# Patient Record
Sex: Female | Born: 1980 | Race: White | Hispanic: No | Marital: Married | Smoking: Former smoker
Health system: Southern US, Community
[De-identification: ages and names within clinical notes are randomized; demographics above are authoritative.]

## PROBLEM LIST (undated history)

## (undated) DIAGNOSIS — F419 Anxiety disorder, unspecified: Secondary | ICD-10-CM

## (undated) DIAGNOSIS — N200 Calculus of kidney: Secondary | ICD-10-CM

## (undated) DIAGNOSIS — I10 Essential (primary) hypertension: Secondary | ICD-10-CM

## (undated) DIAGNOSIS — F32A Depression, unspecified: Secondary | ICD-10-CM

## (undated) DIAGNOSIS — K219 Gastro-esophageal reflux disease without esophagitis: Secondary | ICD-10-CM

## (undated) DIAGNOSIS — I219 Acute myocardial infarction, unspecified: Secondary | ICD-10-CM

## (undated) DIAGNOSIS — E669 Obesity, unspecified: Secondary | ICD-10-CM

## (undated) DIAGNOSIS — G43909 Migraine, unspecified, not intractable, without status migrainosus: Secondary | ICD-10-CM

## (undated) DIAGNOSIS — F329 Major depressive disorder, single episode, unspecified: Secondary | ICD-10-CM

## (undated) DIAGNOSIS — M1990 Unspecified osteoarthritis, unspecified site: Secondary | ICD-10-CM

## (undated) DIAGNOSIS — M069 Rheumatoid arthritis, unspecified: Secondary | ICD-10-CM

## (undated) DIAGNOSIS — L4050 Arthropathic psoriasis, unspecified: Secondary | ICD-10-CM

## (undated) HISTORY — PX: ABDOMINAL HYSTERECTOMY: SHX81

## (undated) HISTORY — DX: Arthropathic psoriasis, unspecified: L40.50

## (undated) HISTORY — DX: Depression, unspecified: F32.A

## (undated) HISTORY — DX: Migraine, unspecified, not intractable, without status migrainosus: G43.909

## (undated) HISTORY — DX: Unspecified osteoarthritis, unspecified site: M19.90

## (undated) HISTORY — DX: Anxiety disorder, unspecified: F41.9

## (undated) HISTORY — PX: LITHOTRIPSY: SUR834

## (undated) HISTORY — DX: Essential (primary) hypertension: I10

## (undated) HISTORY — DX: Obesity, unspecified: E66.9

## (undated) HISTORY — DX: Gastro-esophageal reflux disease without esophagitis: K21.9

---
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
## 2006-01-31 ENCOUNTER — Emergency Department: Payer: Self-pay | Admitting: Unknown Physician Specialty

## 2006-09-29 ENCOUNTER — Emergency Department: Payer: Self-pay | Admitting: Emergency Medicine

## 2006-11-18 ENCOUNTER — Emergency Department: Payer: Self-pay | Admitting: Emergency Medicine

## 2008-12-12 ENCOUNTER — Emergency Department: Payer: Self-pay | Admitting: Emergency Medicine

## 2009-09-14 ENCOUNTER — Emergency Department: Payer: Self-pay | Admitting: Emergency Medicine

## 2010-02-09 ENCOUNTER — Emergency Department: Payer: Self-pay | Admitting: Emergency Medicine

## 2010-03-03 ENCOUNTER — Emergency Department: Payer: Self-pay | Admitting: Emergency Medicine

## 2010-12-09 ENCOUNTER — Emergency Department: Payer: Self-pay | Admitting: Emergency Medicine

## 2011-03-22 ENCOUNTER — Emergency Department: Payer: Self-pay | Admitting: Emergency Medicine

## 2011-07-10 ENCOUNTER — Emergency Department: Payer: Self-pay | Admitting: Emergency Medicine

## 2011-09-08 ENCOUNTER — Ambulatory Visit: Payer: Self-pay | Admitting: Internal Medicine

## 2011-09-08 LAB — URINALYSIS, COMPLETE
Blood: NEGATIVE
Glucose,UR: NEGATIVE mg/dL (ref 0–75)
Leukocyte Esterase: NEGATIVE
Nitrite: NEGATIVE
Specific Gravity: 1.02 (ref 1.003–1.030)

## 2012-02-15 ENCOUNTER — Emergency Department: Payer: Self-pay | Admitting: Emergency Medicine

## 2012-02-15 LAB — CBC
HCT: 41.6 %
HGB: 13.9 g/dL
MCH: 32 pg
MCHC: 33.3 g/dL
MCV: 96 fL
Platelet: 309 x10 3/mm 3
RBC: 4.33 X10 6/mm 3
RDW: 13.9 %
WBC: 11.1 x10 3/mm 3 — ABNORMAL HIGH

## 2012-02-15 LAB — COMPREHENSIVE METABOLIC PANEL WITH GFR
Albumin: 3.3 g/dL — ABNORMAL LOW
Alkaline Phosphatase: 100 U/L
Anion Gap: 8
BUN: 8 mg/dL
Bilirubin,Total: 0.3 mg/dL
Calcium, Total: 8.9 mg/dL
Chloride: 107 mmol/L
Co2: 26 mmol/L
Creatinine: 0.67 mg/dL
EGFR (African American): >60
EGFR (Non-African Amer.): >60
Glucose: 75 mg/dL
Osmolality: 278
Potassium: 3.8 mmol/L
SGOT(AST): 30 U/L
SGPT (ALT): 27 U/L
Sodium: 141 mmol/L
Total Protein: 7.5 g/dL

## 2012-02-15 LAB — LIPASE, BLOOD: Lipase: 138 U/L (ref 73–393)

## 2012-02-15 LAB — URINALYSIS, COMPLETE
Blood: NEGATIVE
Glucose,UR: NEGATIVE mg/dL (ref 0–75)
Leukocyte Esterase: NEGATIVE
Nitrite: NEGATIVE
Ph: 8 (ref 4.5–8.0)
RBC,UR: <1 /HPF (ref 0–5)
Specific Gravity: 1.013 (ref 1.003–1.030)
Squamous Epithelial: <1

## 2012-12-27 ENCOUNTER — Emergency Department: Payer: Self-pay | Admitting: Emergency Medicine

## 2013-01-26 ENCOUNTER — Ambulatory Visit (INDEPENDENT_AMBULATORY_CARE_PROVIDER_SITE_OTHER): Payer: 59 | Admitting: Internal Medicine

## 2013-01-26 ENCOUNTER — Encounter: Payer: Self-pay | Admitting: Internal Medicine

## 2013-01-26 VITALS — BP 144/94 | HR 102 | Temp 98.3°F | Ht 62.75 in | Wt 208.0 lb

## 2013-01-26 DIAGNOSIS — E669 Obesity, unspecified: Secondary | ICD-10-CM

## 2013-01-26 DIAGNOSIS — L4050 Arthropathic psoriasis, unspecified: Secondary | ICD-10-CM

## 2013-01-26 DIAGNOSIS — I10 Essential (primary) hypertension: Secondary | ICD-10-CM

## 2013-01-26 LAB — CBC WITH DIFFERENTIAL/PLATELET
Basophils Relative: 0.4 % (ref 0.0–3.0)
HCT: 44.3 % (ref 36.0–46.0)
Hemoglobin: 15.1 g/dL — ABNORMAL HIGH (ref 12.0–15.0)
Lymphocytes Relative: 35 % (ref 12.0–46.0)
Lymphs Abs: 4.6 10*3/uL — ABNORMAL HIGH (ref 0.7–4.0)
Monocytes Relative: 9.2 % (ref 3.0–12.0)
Neutro Abs: 6.6 10*3/uL (ref 1.4–7.7)
RBC: 4.64 Mil/uL (ref 3.87–5.11)

## 2013-01-26 LAB — COMPREHENSIVE METABOLIC PANEL
ALT: 28 U/L (ref 0–35)
AST: 23 U/L (ref 0–37)
CO2: 28 mEq/L (ref 19–32)
Calcium: 9.3 mg/dL (ref 8.4–10.5)
Chloride: 101 mEq/L (ref 96–112)
Creatinine, Ser: 0.6 mg/dL (ref 0.4–1.2)
GFR: 114.7 mL/min (ref >60.00)
Sodium: 136 mEq/L (ref 135–145)
Total Bilirubin: 0.4 mg/dL (ref 0.3–1.2)
Total Protein: 7.9 g/dL (ref 6.0–8.3)

## 2013-01-26 LAB — MICROALBUMIN / CREATININE URINE RATIO: Microalb Creat Ratio: 0.3 mg/g (ref 0.0–30.0)

## 2013-01-26 LAB — TSH: TSH: 1.95 u[IU]/mL (ref 0.35–5.50)

## 2013-01-26 MED ORDER — LISINOPRIL 10 MG PO TABS: 10.0000 mg | ORAL_TABLET | Freq: Every day | ORAL | Status: DC

## 2013-01-26 NOTE — Assessment & Plan Note (Signed)
H/o psoriatic arthritis on Enbrel. Poor control of joint pain and swelling in the hands. Decreased grip strength. Will set up rheumatology evaluation. Question if a second medication such as methotrexate might be helpful. Will request previous notes from [facility].

## 2013-01-26 NOTE — Assessment & Plan Note (Signed)
BP Readings from Last 3 Encounters:  01/26/13 144/94   BP elevated in clinic and at home. Will start Lisinopril 10mg  daily. Pt has tolerated this well in the past. Then, recheck Cr and K in 1 week. Follow up 4 weeks and prn.

## 2013-01-26 NOTE — Patient Instructions (Signed)
[employer] app for phone 

## 2013-01-26 NOTE — Assessment & Plan Note (Signed)
Body mass index is 37.13 kg/(m^2).  Recommended keeping a food and calorie diet with app such as [employer]itnessPal. Recommended increased physical activity with goal of per week. We discussed appetite suppressants, however will hold off for now because of hypertension.

## 2013-01-26 NOTE — Progress Notes (Signed)
Subjective:    Patient ID: Chelsea Nolan, female    DOB: 02-13-1981, 32 y.o.   MRN: [id number]  HPI 32 year old female with history of psoriatic arthritis, hypertension, obesity presents to establish care. She reports that she is generally feeling well. She does continue to have aching pain in the joints of her arms and hands. She also has aching pain in her back. She reports that symptoms of psoriasis improves significantly with the use of Enbrel. Arthralgia improved somewhat with use of this medication. She is not currently followed by rheumatologist but would like to reestablish care. She does follow regularly with her dermatologist.  In regards to hypertension, she reports she was previously on lisinopril hydrochlorothiazide. She has not been on medication in years. Recent blood pressures have been greater than 140/90. She denies any chest pain, palpitations, headache.  In regards to her obesity, she reports she has struggled with her weight throughout her life. She has been working on trying to improve her diet. She thinks that stress plays a significant role and overeating. She is interested in using appetite suppressants. She notes that her sister had gastric bypass and did very poorly after surgery.  Outpatient Encounter Prescriptions as of 01/26/2013  Medication Sig Dispense Refill  . etanercept (ENBREL) 50 MG/ML injection Inject 50 mg into the skin once a week.       No facility-administered encounter medications on file as of 01/26/2013.   BP 144/94  Pulse 102  Temp(Src) 98.3 F (36.8 C) (Oral)  Ht 5' 2.75" (1.594 m)  Wt 208 lb (94.348 kg)  BMI 37.13 kg/m2  SpO2 96%  LMP 01/24/2013  Review of Systems  Constitutional: Negative for fever, chills, appetite change, fatigue and unexpected weight change.  HENT: Negative for ear pain, congestion, sore throat, trouble swallowing, neck pain, voice change and sinus pressure.   Eyes: Negative for visual disturbance.  Respiratory: Negative  for cough, shortness of breath, wheezing and stridor.   Cardiovascular: Negative for chest pain, palpitations and leg swelling.  Gastrointestinal: Negative for nausea, vomiting, abdominal pain, diarrhea, constipation, blood in stool, abdominal distention and anal bleeding.  Genitourinary: Negative for dysuria and flank pain.  Musculoskeletal: Positive for arthralgias. Negative for myalgias and gait problem.  Skin: Negative for color change and rash.  Neurological: Negative for dizziness and headaches.  Hematological: Negative for adenopathy. Does not bruise/bleed easily.  Psychiatric/Behavioral: Negative for suicidal ideas, sleep disturbance and dysphoric mood. The patient is not nervous/anxious.        Objective:   Physical Exam  Constitutional: She is oriented to person, place, and time. She appears well-developed and well-nourished. No distress.  HENT:  Head: Normocephalic and atraumatic.  Right Ear: External ear normal.  Left Ear: External ear normal.  Nose: Nose normal.  Mouth/Throat: Oropharynx is clear and moist. No oropharyngeal exudate.  Eyes: Conjunctivae are normal. Pupils are equal, round, and reactive to light. Right eye exhibits no discharge. Left eye exhibits no discharge. No scleral icterus.  Neck: Normal range of motion. Neck supple. No tracheal deviation present. No thyromegaly present.  Cardiovascular: Normal rate, regular rhythm, normal heart sounds and intact distal pulses.  Exam reveals no gallop and no friction rub.   No murmur heard. Pulmonary/Chest: Effort normal and breath sounds normal. No accessory muscle usage. Not tachypneic. No respiratory distress. She has no decreased breath sounds. She has no wheezes. She has no rhonchi. She has no rales. She exhibits no tenderness.  Abdominal: Soft. Bowel sounds are normal. She exhibits no  distension and no mass. There is no tenderness. There is no rebound and no guarding.  Musculoskeletal: Normal range of motion. She  exhibits no edema and no tenderness.  Lymphadenopathy:    She has no cervical adenopathy.  Neurological: She is alert and oriented to person, place, and time. No cranial nerve deficit. She exhibits normal muscle tone. Coordination normal.  Skin: Skin is warm and dry. No rash noted. She is not diaphoretic. No erythema. No pallor.  Psychiatric: She has a normal mood and affect. Her behavior is normal. Judgment and thought content normal.          Assessment & Plan:

## 2013-01-27 ENCOUNTER — Telehealth: Payer: Self-pay | Admitting: *Deleted

## 2013-01-27 NOTE — Telephone Encounter (Signed)
Message copied by Theola Sequin on Fri Jan 27, 2013 11:22 AM ------      Message from: Ronna Polio A      Created: Thu Jan 26, 2013  7:16 PM       Labs show normal kidney and liver function. Normal thyroid function. WBC was slightly high. Other blood counts normal. I am still waiting on some additional labs. ------

## 2013-01-27 NOTE — Telephone Encounter (Signed)
Patient informed about her lab results, but was curious if the elevated WBC was due to taking the Enbrel injections?

## 2013-01-27 NOTE — Telephone Encounter (Signed)
That is possible

## 2013-01-30 NOTE — Telephone Encounter (Signed)
Patient informed and verbally agreed.  

## 2013-02-01 ENCOUNTER — Other Ambulatory Visit (INDEPENDENT_AMBULATORY_CARE_PROVIDER_SITE_OTHER): Payer: 59

## 2013-02-01 DIAGNOSIS — I10 Essential (primary) hypertension: Secondary | ICD-10-CM

## 2013-02-01 LAB — BASIC METABOLIC PANEL
BUN: 11 mg/dL (ref 6–23)
CO2: 25 mEq/L (ref 19–32)
Chloride: 104 mEq/L (ref 96–112)
Creatinine, Ser: 0.7 mg/dL (ref 0.4–1.2)
Glucose, Bld: 137 mg/dL — ABNORMAL HIGH (ref 70–99)
Potassium: 3.9 mEq/L (ref 3.5–5.1)

## 2013-02-27 ENCOUNTER — Ambulatory Visit (INDEPENDENT_AMBULATORY_CARE_PROVIDER_SITE_OTHER)
Admission: RE | Admit: 2013-02-27 | Discharge: 2013-02-27 | Disposition: A | Discharge status: Home / Self Care | Payer: 59 | Source: Ambulatory Visit | Attending: Internal Medicine | Admitting: Internal Medicine

## 2013-02-27 ENCOUNTER — Encounter: Payer: Self-pay | Admitting: Internal Medicine

## 2013-02-27 ENCOUNTER — Ambulatory Visit (INDEPENDENT_AMBULATORY_CARE_PROVIDER_SITE_OTHER): Payer: 59 | Admitting: Internal Medicine

## 2013-02-27 VITALS — BP 134/108 | HR 102 | Temp 98.7°F | Wt 207.0 lb

## 2013-02-27 DIAGNOSIS — J189 Pneumonia, unspecified organism: Secondary | ICD-10-CM | POA: Insufficient documentation

## 2013-02-27 DIAGNOSIS — I10 Essential (primary) hypertension: Secondary | ICD-10-CM

## 2013-02-27 MED ORDER — HYDROCOD POLST-CHLORPHEN POLST 10-8 MG/5ML PO LQCR: 5.0000 mL | Freq: Two times a day (BID) | ORAL | Status: DC | PRN

## 2013-02-27 MED ORDER — LEVOFLOXACIN 750 MG PO TABS: 750.0000 mg | ORAL_TABLET | Freq: Every day | ORAL | Status: DC

## 2013-02-27 NOTE — Assessment & Plan Note (Signed)
BP poorly controlled today, however pt reports has been well controlled at home. Will continue lisinopril. Will recheck BP at visit in 2 days.

## 2013-02-27 NOTE — Progress Notes (Signed)
Subjective:    Patient ID: Chelsea Nolan, female    DOB: 01/10/1981, 32 y.o.   MRN: [id number]  HPI 32YO female with h/o psoriatic arthritis on Embrel presents for acute visit c/o cough productive of purulent sputum x 3 days. Symptoms began on Saturday. No fever, chills noted. Complains of right sided chest wall pain, made worse by cough. No wheezing. Occasional dyspnea during coughing spells. Using OTC cough medication with no improvement. No known sick contacts.  Outpatient Encounter Prescriptions as of 02/27/2013  Medication Sig Dispense Refill  . etanercept (ENBREL) 50 MG/ML injection Inject 50 mg into the skin once a week.      Marland Kitchen lisinopril (PRINIVIL,ZESTRIL) 10 MG tablet Take 1 tablet (10 mg total) by mouth daily.  90 tablet  3  . Phenyleph-Doxylamine-DM-APAP (TYLENOL COLD MULTI-SYMPTOM) 5-6.25-10-325 MG/15ML LIQD Take by mouth.      . pseudoephedrine-guaifenesin (MUCINEX D) 60-600 MG per tablet Take 1 tablet by mouth every 12 (twelve) hours.       No facility-administered encounter medications on file as of 02/27/2013.   BP 134/108  Pulse 102  Temp(Src) 98.7 F (37.1 C) (Oral)  Wt 207 lb (93.895 kg)  BMI 36.95 kg/m2  SpO2 97%  LMP 02/21/2013  Review of Systems  Constitutional: Positive for fatigue. Negative for fever, chills and unexpected weight change.  HENT: Negative for hearing loss, ear pain, nosebleeds, congestion, sore throat, facial swelling, rhinorrhea, sneezing, mouth sores, trouble swallowing, neck pain, neck stiffness, voice change, postnasal drip, sinus pressure, tinnitus and ear discharge.   Eyes: Negative for pain, discharge, redness and visual disturbance.  Respiratory: Positive for cough and shortness of breath. Negative for chest tightness, wheezing and stridor.   Cardiovascular: Positive for chest pain. Negative for palpitations and leg swelling.  Musculoskeletal: Negative for myalgias and arthralgias.  Skin: Negative for color change and rash.  Neurological:  Negative for dizziness, weakness, light-headedness and headaches.  Hematological: Negative for adenopathy.       Objective:   Physical Exam  Constitutional: She is oriented to person, place, and time. She appears well-developed and well-nourished. No distress.  HENT:  Head: Normocephalic and atraumatic.  Right Ear: External ear normal.  Left Ear: External ear normal.  Nose: Nose normal.  Mouth/Throat: Oropharynx is clear and moist. No oropharyngeal exudate.  Eyes: Conjunctivae are normal. Pupils are equal, round, and reactive to light. Right eye exhibits no discharge. Left eye exhibits no discharge. No scleral icterus.  Neck: Normal range of motion. Neck supple. No tracheal deviation present. No thyromegaly present.  Cardiovascular: Normal rate, regular rhythm, normal heart sounds and intact distal pulses.  Exam reveals no gallop and no friction rub.   No murmur heard. Pulmonary/Chest: Effort normal. No accessory muscle usage. Not tachypneic. No respiratory distress. She has decreased breath sounds in the right lower field. She has no wheezes. She has rhonchi in the right lower field. She has no rales. She exhibits no tenderness.  Musculoskeletal: Normal range of motion. She exhibits no edema and no tenderness.  Lymphadenopathy:    She has no cervical adenopathy.  Neurological: She is alert and oriented to person, place, and time. No cranial nerve deficit. She exhibits normal muscle tone. Coordination normal.  Skin: Skin is warm and dry. No rash noted. She is not diaphoretic. No erythema. No pallor.  Psychiatric: She has a normal mood and affect. Her behavior is normal. Judgment and thought content normal.          Assessment & Plan:

## 2013-02-27 NOTE — Assessment & Plan Note (Signed)
Exam is most consistent with CAP RLL, however CXR normal. Will treat with levaquin and use Tussionex for cough. Pt will follow up for recheck in 2 days or sooner if symptoms are worsening. Encouraged rest, increased fluids.

## 2013-03-02 ENCOUNTER — Ambulatory Visit (INDEPENDENT_AMBULATORY_CARE_PROVIDER_SITE_OTHER): Payer: 59 | Admitting: Internal Medicine

## 2013-03-02 ENCOUNTER — Encounter: Payer: Self-pay | Admitting: Internal Medicine

## 2013-03-02 VITALS — BP 142/100 | HR 105 | Temp 99.0°F | Wt 208.0 lb

## 2013-03-02 DIAGNOSIS — J189 Pneumonia, unspecified organism: Secondary | ICD-10-CM

## 2013-03-02 LAB — COMPREHENSIVE METABOLIC PANEL
AST: 26 U/L (ref 0–37)
Alkaline Phosphatase: 84 U/L (ref 39–117)
BUN: 7 mg/dL (ref 6–23)
Calcium: 9.4 mg/dL (ref 8.4–10.5)
Creatinine, Ser: 0.7 mg/dL (ref 0.4–1.2)
Total Bilirubin: 0.6 mg/dL (ref 0.3–1.2)

## 2013-03-02 LAB — CBC WITH DIFFERENTIAL/PLATELET
Basophils Relative: 0.5 % (ref 0.0–3.0)
Eosinophils Absolute: 0.7 10*3/uL (ref 0.0–0.7)
Hemoglobin: 14.5 g/dL (ref 12.0–15.0)
Lymphocytes Relative: 41.7 % (ref 12.0–46.0)
MCHC: 34 g/dL (ref 30.0–36.0)
MCV: 94.3 fl (ref 78.0–100.0)
Neutro Abs: 4.3 10*3/uL (ref 1.4–7.7)
RBC: 4.51 Mil/uL (ref 3.87–5.11)

## 2013-03-02 MED ORDER — BENZONATATE 200 MG PO CAPS: 200.0000 mg | ORAL_CAPSULE | Freq: Two times a day (BID) | ORAL | Status: DC | PRN

## 2013-03-02 NOTE — Assessment & Plan Note (Signed)
Symptoms have improved with decreased cough after starting treatment with Levaquin. Exam is much improved with better air movement today. However, still having some cough during daytime. Will add tessalon during daytime and continue Tussionex at night. Will check CBC today. Encouraged rest, increased fluid intake. Will recheck next week or sooner if symptoms are not continuing to improve.

## 2013-03-02 NOTE — Patient Instructions (Signed)
[email or web address]  

## 2013-03-02 NOTE — Progress Notes (Signed)
Subjective:    Patient ID: Chelsea Nolan, female    DOB: 08-Nov-1980, 32 y.o.   MRN: [id number]  HPI 32YO female presents for follow up pneumonia. Symptoms of cough have improved, however still having productive cough with purulent sputum esp at night. Taking Tussionex with minimal improvement. No fever at home, however has felt subjectively "hot." Continues to have mild chest wall pain with cough. Shortness of breath has improved. No side effects noted on Levaquin. Pt has stopped her Embrel.  Outpatient Encounter Prescriptions as of 03/02/2013  Medication Sig Dispense Refill  . chlorpheniramine-HYDROcodone (TUSSIONEX) 10-8 MG/5ML LQCR Take 5 mLs by mouth every 12 (twelve) hours as needed.  140 mL  0  . etanercept (ENBREL) 50 MG/ML injection Inject 50 mg into the skin once a week.      Marland Kitchen levofloxacin (LEVAQUIN) 750 MG tablet Take 1 tablet (750 mg total) by mouth daily.  7 tablet  0  . lisinopril (PRINIVIL,ZESTRIL) 10 MG tablet Take 1 tablet (10 mg total) by mouth daily.  90 tablet  3  . Phenyleph-Doxylamine-DM-APAP (TYLENOL COLD MULTI-SYMPTOM) 5-6.25-10-325 MG/15ML LIQD Take by mouth.       No facility-administered encounter medications on file as of 03/02/2013.   BP 142/100  Pulse 105  Temp(Src) 99 F (37.2 C) (Oral)  Wt 208 lb (94.348 kg)  BMI 37.13 kg/m2  SpO2 97%  LMP 02/21/2013  Review of Systems  Constitutional: Positive for fatigue. Negative for fever, chills and unexpected weight change.  HENT: Negative for hearing loss, ear pain, nosebleeds, congestion, sore throat, facial swelling, rhinorrhea, sneezing, mouth sores, trouble swallowing, neck pain, neck stiffness, voice change, postnasal drip, sinus pressure, tinnitus and ear discharge.   Eyes: Negative for pain, discharge, redness and visual disturbance.  Respiratory: Positive for cough. Negative for chest tightness, shortness of breath, wheezing and stridor.   Cardiovascular: Positive for chest pain (chest wall pain with cough).  Negative for palpitations and leg swelling.  Musculoskeletal: Negative for myalgias and arthralgias.  Skin: Negative for color change and rash.  Neurological: Negative for dizziness, weakness, light-headedness and headaches.  Hematological: Negative for adenopathy.       Objective:   Physical Exam  Constitutional: She is oriented to person, place, and time. She appears well-developed and well-nourished. No distress.  HENT:  Head: Normocephalic and atraumatic.  Right Ear: External ear normal.  Left Ear: External ear normal.  Nose: Nose normal.  Mouth/Throat: Oropharynx is clear and moist. No oropharyngeal exudate.  Eyes: Conjunctivae are normal. Pupils are equal, round, and reactive to light. Right eye exhibits no discharge. Left eye exhibits no discharge. No scleral icterus.  Neck: Normal range of motion. Neck supple. No tracheal deviation present. No thyromegaly present.  Cardiovascular: Normal rate, regular rhythm, normal heart sounds and intact distal pulses.  Exam reveals no gallop and no friction rub.   No murmur heard. Pulmonary/Chest: Effort normal. No accessory muscle usage. Not tachypneic. No respiratory distress. She has no decreased breath sounds. She has no wheezes. She has rhonchi (scattered). She has no rales. She exhibits no tenderness.  Musculoskeletal: Normal range of motion. She exhibits no edema and no tenderness.  Lymphadenopathy:    She has no cervical adenopathy.  Neurological: She is alert and oriented to person, place, and time. No cranial nerve deficit. She exhibits normal muscle tone. Coordination normal.  Skin: Skin is warm and dry. No rash noted. She is not diaphoretic. No erythema. No pallor.  Psychiatric: She has a normal mood and affect. Her  behavior is normal. Judgment and thought content normal.          Assessment & Plan:

## 2013-03-05 ENCOUNTER — Emergency Department: Payer: Self-pay | Admitting: Emergency Medicine

## 2013-03-05 LAB — CBC
HCT: 44.6 % (ref 35.0–47.0)
MCH: 32.3 pg (ref 26.0–34.0)
MCHC: 35 g/dL (ref 32.0–36.0)
MCV: 92 fL (ref 80–100)
WBC: 12.7 10*3/uL — ABNORMAL HIGH (ref 3.6–11.0)

## 2013-03-05 LAB — BASIC METABOLIC PANEL
Anion Gap: 7 (ref 7–16)
BUN: 8 mg/dL (ref 7–18)
Chloride: 104 mmol/L (ref 98–107)
Creatinine: 0.76 mg/dL (ref 0.60–1.30)
EGFR (African American): >60
EGFR (Non-African Amer.): >60
Glucose: 96 mg/dL (ref 65–99)
Osmolality: 270 (ref 275–301)
Sodium: 136 mmol/L (ref 136–145)

## 2013-03-06 ENCOUNTER — Other Ambulatory Visit: Payer: Self-pay | Admitting: Internal Medicine

## 2013-03-06 DIAGNOSIS — J189 Pneumonia, unspecified organism: Secondary | ICD-10-CM

## 2013-03-06 MED ORDER — HYDROCOD POLST-CHLORPHEN POLST 10-8 MG/5ML PO LQCR: 5.0000 mL | Freq: Two times a day (BID) | ORAL | Status: DC | PRN

## 2013-03-08 ENCOUNTER — Encounter: Payer: Self-pay | Admitting: Internal Medicine

## 2013-03-08 ENCOUNTER — Ambulatory Visit (INDEPENDENT_AMBULATORY_CARE_PROVIDER_SITE_OTHER): Payer: 59 | Admitting: Internal Medicine

## 2013-03-08 VITALS — BP 140/98 | HR 112 | Temp 98.8°F | Wt 204.0 lb

## 2013-03-08 DIAGNOSIS — Z720 Tobacco use: Secondary | ICD-10-CM

## 2013-03-08 DIAGNOSIS — Z23 Encounter for immunization: Secondary | ICD-10-CM

## 2013-03-08 DIAGNOSIS — L4050 Arthropathic psoriasis, unspecified: Secondary | ICD-10-CM

## 2013-03-08 DIAGNOSIS — F17200 Nicotine dependence, unspecified, uncomplicated: Secondary | ICD-10-CM

## 2013-03-08 DIAGNOSIS — E669 Obesity, unspecified: Secondary | ICD-10-CM

## 2013-03-08 DIAGNOSIS — I10 Essential (primary) hypertension: Secondary | ICD-10-CM

## 2013-03-08 DIAGNOSIS — J189 Pneumonia, unspecified organism: Secondary | ICD-10-CM

## 2013-03-08 MED ORDER — LISINOPRIL 20 MG PO TABS: 20.0000 mg | ORAL_TABLET | Freq: Every day | ORAL | Status: DC

## 2013-03-08 NOTE — Progress Notes (Signed)
Subjective:    Patient ID: Chelsea Nolan, female    DOB: 08-Mar-1981, 32 y.o.   MRN: [id number]  HPI 32 year old female with history of psoriatic arthritis, hypertension, and recent episode of pneumonia presents for followup. She reports that symptoms of cough, fever, chest pain have resolved. She completed a course of Levaquin. She notes that since she has been off her Enbrel her symptoms of abdominal pain and nausea have completely resolved. She would like to try different medication to better manage her symptoms of psoriasis. She is scheduled to see a rheumatologist later this month.   She is also concerned about her weight. She would like to try medication to help suppress her appetite. She is not following any particular diet or exercise program.  She has a history of hypertension and is compliant with use of lisinopril. She denies any recent chest pain, headache, palpitations.  Outpatient Encounter Prescriptions as of 03/08/2013  Medication Sig Dispense Refill  . lisinopril (PRINIVIL,ZESTRIL) 20 MG tablet Take 1 tablet (20 mg total) by mouth daily.  90 tablet  3  . loratadine (CLARITIN) 10 MG tablet Take 10 mg by mouth daily.      . [DISCONTINUED] etanercept (ENBREL) 50 MG/ML injection Inject 50 mg into the skin once a week.      . [DISCONTINUED] levofloxacin (LEVAQUIN) 750 MG tablet Take 1 tablet (750 mg total) by mouth daily.  7 tablet  0  . [DISCONTINUED] lisinopril (PRINIVIL,ZESTRIL) 10 MG tablet Take 1 tablet (10 mg total) by mouth daily.  90 tablet  3  . benzonatate (TESSALON) 200 MG capsule Take 1 capsule (200 mg total) by mouth 2 (two) times daily as needed for cough.  60 capsule  0  . chlorpheniramine-HYDROcodone (TUSSIONEX) 10-8 MG/5ML LQCR Take 5 mLs by mouth every 12 (twelve) hours as needed.  140 mL  0  . Phenyleph-Doxylamine-DM-APAP (TYLENOL COLD MULTI-SYMPTOM) 5-6.25-10-325 MG/15ML LIQD Take by mouth.       No facility-administered encounter medications on file as of 03/08/2013.    BP 140/98  Pulse 112  Temp(Src) 98.8 F (37.1 C) (Oral)  Wt 204 lb (92.534 kg)  BMI 36.42 kg/m2  SpO2 97%  LMP 02/21/2013  Review of Systems  Constitutional: Negative for fever, chills, appetite change, fatigue and unexpected weight change.  HENT: Negative for ear pain, congestion, sore throat, trouble swallowing, neck pain, voice change and sinus pressure.   Eyes: Negative for visual disturbance.  Respiratory: Negative for cough, shortness of breath, wheezing and stridor.   Cardiovascular: Negative for chest pain, palpitations and leg swelling.  Gastrointestinal: Negative for nausea, vomiting, abdominal pain, diarrhea, constipation, blood in stool, abdominal distention and anal bleeding.  Genitourinary: Negative for dysuria and flank pain.  Musculoskeletal: Negative for myalgias, arthralgias and gait problem.  Skin: Negative for color change and rash.  Neurological: Negative for dizziness and headaches.  Hematological: Negative for adenopathy. Does not bruise/bleed easily.  Psychiatric/Behavioral: Negative for suicidal ideas, sleep disturbance and dysphoric mood. The patient is not nervous/anxious.        Objective:   Physical Exam  Constitutional: She is oriented to person, place, and time. She appears well-developed and well-nourished. No distress.  HENT:  Head: Normocephalic and atraumatic.  Right Ear: External ear normal.  Left Ear: External ear normal.  Nose: Nose normal.  Mouth/Throat: Oropharynx is clear and moist. No oropharyngeal exudate.  Eyes: Conjunctivae are normal. Pupils are equal, round, and reactive to light. Right eye exhibits no discharge. Left eye exhibits no discharge. No  scleral icterus.  Neck: Normal range of motion. Neck supple. No tracheal deviation present. No thyromegaly present.  Cardiovascular: Normal rate, regular rhythm, normal heart sounds and intact distal pulses.  Exam reveals no gallop and no friction rub.   No murmur  heard. Pulmonary/Chest: Effort normal and breath sounds normal. No accessory muscle usage. Not tachypneic. No respiratory distress. She has no decreased breath sounds. She has no wheezes. She has no rhonchi. She has no rales. She exhibits no tenderness.  Musculoskeletal: Normal range of motion. She exhibits no edema and no tenderness.  Lymphadenopathy:    She has no cervical adenopathy.  Neurological: She is alert and oriented to person, place, and time. No cranial nerve deficit. She exhibits normal muscle tone. Coordination normal.  Skin: Skin is warm and dry. No rash noted. She is not diaphoretic. No erythema. No pallor.  Psychiatric: She has a normal mood and affect. Her behavior is normal. Judgment and thought content normal.          Assessment & Plan:

## 2013-03-08 NOTE — Assessment & Plan Note (Signed)
BP Readings from Last 3 Encounters:  03/08/13 140/98  03/02/13 142/100  02/27/13 134/108   Blood pressure continues to be elevated. Will increase lisinopril to 20 mg daily. Will recheck creatinine and potassium in one week. Followup in 4 weeks.

## 2013-03-08 NOTE — Patient Instructions (Signed)
Increase Lisinopril to 20mg  daily.  Check labs next week.  Follow up in 4 weeks.

## 2013-03-08 NOTE — Assessment & Plan Note (Signed)
She has stopped Enbrel because of nausea and abdominal pain on this medication. Encouraged her to discuss this with rheumatology next week. Question if she might be a candidate for alternative medication such as methotrexate.

## 2013-03-08 NOTE — Assessment & Plan Note (Signed)
Strongly encouraged smoking cessation. 

## 2013-03-08 NOTE — Assessment & Plan Note (Addendum)
Symptoms of pneumonia have resolved after treatment with Levaquin. Will get recent chest x-ray report completed at [facility] regional over the weekend. Patient will call if any recurrent symptoms.

## 2013-03-08 NOTE — Assessment & Plan Note (Signed)
Wt Readings from Last 3 Encounters:  03/08/13 204 lb (92.534 kg)  03/02/13 208 lb (94.348 kg)  02/27/13 207 lb (93.895 kg)   Body mass index is 36.42 kg/(m^2). Patient would like to try phentermine to help with appetite suppression. We discussed that she will need to get her blood pressure under better control prior to starting this medication. As noted, will increase lisinopril today. Encouraged effort at healthy diet, low in processed carbohydrates and saturated fat. Encouraged regular physical activity. Follow up in 4 weeks.

## 2013-03-09 ENCOUNTER — Encounter: Payer: Self-pay | Admitting: Internal Medicine

## 2013-03-22 ENCOUNTER — Other Ambulatory Visit (INDEPENDENT_AMBULATORY_CARE_PROVIDER_SITE_OTHER): Payer: 59

## 2013-03-22 DIAGNOSIS — I10 Essential (primary) hypertension: Secondary | ICD-10-CM

## 2013-03-22 LAB — BASIC METABOLIC PANEL
BUN: 7 mg/dL (ref 6–23)
Chloride: 102 mEq/L (ref 96–112)
GFR: 108.68 mL/min (ref >60.00)
Potassium: 4.2 mEq/L (ref 3.5–5.1)
Sodium: 134 mEq/L — ABNORMAL LOW (ref 135–145)

## 2013-03-25 ENCOUNTER — Emergency Department: Payer: Self-pay | Admitting: Emergency Medicine

## 2013-04-05 ENCOUNTER — Encounter: Payer: Self-pay | Admitting: Internal Medicine

## 2013-04-05 ENCOUNTER — Ambulatory Visit (INDEPENDENT_AMBULATORY_CARE_PROVIDER_SITE_OTHER): Payer: 59 | Admitting: Internal Medicine

## 2013-04-05 VITALS — BP 128/98 | HR 100 | Temp 98.7°F | Ht 62.75 in | Wt 203.0 lb

## 2013-04-05 DIAGNOSIS — E669 Obesity, unspecified: Secondary | ICD-10-CM

## 2013-04-05 DIAGNOSIS — L4050 Arthropathic psoriasis, unspecified: Secondary | ICD-10-CM

## 2013-04-05 DIAGNOSIS — I10 Essential (primary) hypertension: Secondary | ICD-10-CM

## 2013-04-05 DIAGNOSIS — Z23 Encounter for immunization: Secondary | ICD-10-CM

## 2013-04-05 MED ORDER — LISINOPRIL 40 MG PO TABS: 40.0000 mg | ORAL_TABLET | Freq: Every day | ORAL | Status: DC

## 2013-04-05 NOTE — Assessment & Plan Note (Signed)
BP Readings from Last 3 Encounters:  04/05/13 128/98  03/08/13 140/98  03/02/13 142/100   BP continues to be elevated. Will increase Lisinopril to 40mg  daily. Check Cr and K with labs in 1 week. Follow up in 4 weeks and prn.

## 2013-04-05 NOTE — Progress Notes (Signed)
Subjective:    Patient ID: Chelsea Nolan, female    DOB: [birth year], 32 y.o.   MRN: [id number]  HPI 32 year old female with history of psoriatic arthritis, hypertension, obesity, tobacco abuse presents for followup. She was recently seen at [facility] by a rheumatologist and was started on methotrexate. She had x-rays which confirmed of psoriatic arthritis. Decision was made to hold Enbrel. She reports that she is tolerating methotrexate well.  In regards to blood pressure, her dose of lisinopril was recently increased to 20 mg daily. She reports that most blood pressure has generally been around 130/90. She denies any headache, chest pain, palpitations. She denies any noted side effects from the lisinopril.  She continues to try to lose weight. She is trying to follow a healthier diet and increase her physical activity. She is interested in using phentermine to help with appetite suppression.  Outpatient Encounter Prescriptions as of 04/05/2013  Medication Sig Dispense Refill  . folic acid (FOLVITE) 1 MG tablet Take 1 mg by mouth. Take 1 tablet (1 mg total) by mouth daily.      Marland Kitchen loratadine (CLARITIN) 10 MG tablet Take 10 mg by mouth daily.      . methotrexate (RHEUMATREX) 2.5 MG tablet Start 4 pills one day per week x 2 weeks, then 6 pills one day per week x 2 weeks, then fill other rx.      . SUMAtriptan (IMITREX) 50 MG tablet Take 50 mg by mouth. Take 50 mg by mouth. Frequency:PHARMDIR   Dosage:50   MG  Instructions:  Note:Taking 1 when aura begins and may repeat in one hour if needed Dose: 50MG       . [DISCONTINUED] lisinopril (PRINIVIL,ZESTRIL) 20 MG tablet Take 1 tablet (20 mg total) by mouth daily.  90 tablet  3  . benzonatate (TESSALON) 200 MG capsule Take 1 capsule (200 mg total) by mouth 2 (two) times daily as needed for cough.  60 capsule  0  . chlorpheniramine-HYDROcodone (TUSSIONEX) 10-8 MG/5ML LQCR Take 5 mLs by mouth every 12 (twelve) hours as needed.  140 mL  0  . lisinopril  (PRINIVIL,ZESTRIL) 40 MG tablet Take 1 tablet (40 mg total) by mouth daily.  90 tablet  3  . Phenyleph-Doxylamine-DM-APAP (TYLENOL COLD MULTI-SYMPTOM) 5-6.25-10-325 MG/15ML LIQD Take by mouth.      . [DISCONTINUED] lisinopril (PRINIVIL,ZESTRIL) 20 MG tablet Take 20 mg by mouth. Take 20 mg by mouth. Take 1 tablet (20 mg total) by mouth daily.       No facility-administered encounter medications on file as of 04/05/2013.   BP 128/98  Pulse 100  Temp(Src) 98.7 F (37.1 C) (Oral)  Ht 5' 2.75" (1.594 m)  Wt 203 lb (92.08 kg)  BMI 36.24 kg/m2  SpO2 97%  Review of Systems  Constitutional: Negative for fever, chills, appetite change, fatigue and unexpected weight change.  HENT: Negative for ear pain, congestion, sore throat, trouble swallowing, neck pain, voice change and sinus pressure.   Eyes: Negative for visual disturbance.  Respiratory: Negative for cough, shortness of breath, wheezing and stridor.   Cardiovascular: Negative for chest pain, palpitations and leg swelling.  Gastrointestinal: Negative for nausea, vomiting, abdominal pain, diarrhea, constipation, blood in stool, abdominal distention and anal bleeding.  Genitourinary: Negative for dysuria and flank pain.  Musculoskeletal: Negative for myalgias, arthralgias and gait problem.  Skin: Negative for color change and rash.  Neurological: Negative for dizziness and headaches.  Hematological: Negative for adenopathy. Does not bruise/bleed easily.  Psychiatric/Behavioral: Negative for suicidal ideas, sleep  disturbance and dysphoric mood. The patient is not nervous/anxious.        Objective:   Physical Exam  Constitutional: She is oriented to person, place, and time. She appears well-developed and well-nourished. No distress.  HENT:  Head: Normocephalic and atraumatic.  Right Ear: External ear normal.  Left Ear: External ear normal.  Nose: Nose normal.  Mouth/Throat: Oropharynx is clear and moist. No oropharyngeal exudate.  Eyes:  Conjunctivae are normal. Pupils are equal, round, and reactive to light. Right eye exhibits no discharge. Left eye exhibits no discharge. No scleral icterus.  Neck: Normal range of motion. Neck supple. No tracheal deviation present. No thyromegaly present.  Cardiovascular: Normal rate, regular rhythm, normal heart sounds and intact distal pulses.  Exam reveals no gallop and no friction rub.   No murmur heard. Pulmonary/Chest: Effort normal and breath sounds normal. No accessory muscle usage. Not tachypneic. No respiratory distress. She has no decreased breath sounds. She has no wheezes. She has no rhonchi. She has no rales. She exhibits no tenderness.  Musculoskeletal: Normal range of motion. She exhibits no edema and no tenderness.  Lymphadenopathy:    She has no cervical adenopathy.  Neurological: She is alert and oriented to person, place, and time. No cranial nerve deficit. She exhibits normal muscle tone. Coordination normal.  Skin: Skin is warm and dry. No rash noted. She is not diaphoretic. No erythema. No pallor.  Psychiatric: She has a normal mood and affect. Her behavior is normal. Judgment and thought content normal.          Assessment & Plan:

## 2013-04-05 NOTE — Assessment & Plan Note (Signed)
Wt Readings from Last 3 Encounters:  04/05/13 203 lb (92.08 kg)  03/08/13 204 lb (92.534 kg)  03/02/13 208 lb (94.348 kg)   Congratulated pt on weight loss. Encouraged continued effort at healthy diet and regular physical activity. Will hold off on using phentermine until BP better controlled.

## 2013-04-05 NOTE — Assessment & Plan Note (Signed)
Reviewed notes, labs and imaging from [facility] with pt. Will continue Methotrexate. Follow up with rheumatology as scheduled. Flu vaccine given today.

## 2013-04-10 ENCOUNTER — Encounter: Payer: Self-pay | Admitting: Internal Medicine

## 2013-04-10 ENCOUNTER — Encounter: Payer: Self-pay | Admitting: Emergency Medicine

## 2013-04-10 DIAGNOSIS — M5430 Sciatica, unspecified side: Secondary | ICD-10-CM

## 2013-04-17 ENCOUNTER — Ambulatory Visit (INDEPENDENT_AMBULATORY_CARE_PROVIDER_SITE_OTHER)
Admission: RE | Admit: 2013-04-17 | Discharge: 2013-04-17 | Disposition: A | Discharge status: Home / Self Care | Payer: 59 | Source: Ambulatory Visit | Attending: Family Medicine | Admitting: Family Medicine

## 2013-04-17 ENCOUNTER — Encounter: Payer: Self-pay | Admitting: Family Medicine

## 2013-04-17 ENCOUNTER — Ambulatory Visit (INDEPENDENT_AMBULATORY_CARE_PROVIDER_SITE_OTHER): Payer: 59 | Admitting: Family Medicine

## 2013-04-17 VITALS — BP 100/60 | HR 101 | Temp 98.8°F | Ht 62.75 in | Wt 202.2 lb

## 2013-04-17 DIAGNOSIS — M5416 Radiculopathy, lumbar region: Secondary | ICD-10-CM

## 2013-04-17 DIAGNOSIS — IMO0002 Reserved for concepts with insufficient information to code with codable children: Secondary | ICD-10-CM

## 2013-04-17 MED ORDER — PREGABALIN 75 MG PO CAPS: 75.0000 mg | ORAL_CAPSULE | Freq: Two times a day (BID) | ORAL | Status: DC

## 2013-04-17 MED ORDER — CYCLOBENZAPRINE HCL 10 MG PO TABS: ORAL_TABLET | ORAL | Status: DC

## 2013-04-17 NOTE — Progress Notes (Signed)
Date:  04/17/2013   Name:  Chelsea Nolan   DOB:  02/14/1981   MRN:  [id number] Gender: female Age: 32 y.o.  Primary Physician: Chelsea Dove, MD  Dear Dr. Dan Nolan,  Thank you for having me see Chelsea Nolan in consultation today at [facility] at [facility] for her problem with lumbar radiculopathy.  As you may recall, she is a 32 y.o. year old female with a history of psoriatic arthritis, currently on MTX and previously on Enbrel, who presents with an approx 3 week history of low back pain with some radicular symptoms on the LEFT side. She went to the emergency room at [facility], was given a steroid dose pack and some narcotics. Later, her symptoms returned, and she had another round of steroids.    She continues to have pain, but she has been able to remain working. Currently not doing any rehab. No plain films have been done of the lumbar spine.   No bowel or bladder incontinence. No focal weakness or difficulty walking. No significant past spine history or surgical history.  Past Medical History  Diagnosis Date  . Arthritis   . Hypertension   . Psoriatic arthritis     Dermatologist - Dr. Adolphus Nolan, Rheumatologist - Dr. Gavin Nolan  . Obesity     Past Surgical History  Procedure Laterality Date  . Vaginal delivery    . Cesarean section      pre-eclampsia    History   Social History  . Marital Status: Married    Spouse Name: N/A    Number of Children: N/A  . Years of Education: N/A   Social History Main Topics  . Smoking status: Current Every Day Smoker -- 0.25 packs/day    Types: Cigarettes  . Smokeless tobacco: Never Used  . Alcohol Use: No  . Drug Use: No  . Sexual Activity: None   Other Topics Concern  . None   Social History Narrative   Lives in [place] with 2 daughters and husband. Dog, 3 cats, prairie dog.      Work - Printmaker      Diet - limited fried food, limited sweets      Exercise - none    Family History  Problem  Relation Age of Onset  . Diabetes Mother   . Hyperlipidemia Mother   . Hypertension Mother   . Arthritis Mother   . Lupus Mother   . Obesity Sister     Medications and Allergies reviewed  Outpatient Prescriptions Prior to Visit  Medication Sig Dispense Refill  . folic acid (FOLVITE) 1 MG tablet Take 1 mg by mouth. Take 1 tablet (1 mg total) by mouth daily.      Marland Kitchen lisinopril (PRINIVIL,ZESTRIL) 40 MG tablet Take 1 tablet (40 mg total) by mouth daily.  90 tablet  3  . loratadine (CLARITIN) 10 MG tablet Take 10 mg by mouth daily.      . methotrexate (RHEUMATREX) 2.5 MG tablet Start 4 pills one day per week x 2 weeks, then 6 pills one day per week x 2 weeks, then fill other rx.      . SUMAtriptan (IMITREX) 50 MG tablet Take 50 mg by mouth. Take 50 mg by mouth. Frequency:PHARMDIR   Dosage:50   MG  Instructions:  Note:Taking 1 when aura begins and may repeat in one hour if needed Dose: 50MG       . benzonatate (TESSALON) 200 MG capsule Take 1 capsule (200 mg total) by  mouth 2 (two) times daily as needed for cough.  60 capsule  0  . chlorpheniramine-HYDROcodone (TUSSIONEX) 10-8 MG/5ML LQCR Take 5 mLs by mouth every 12 (twelve) hours as needed.  140 mL  0  . Phenyleph-Doxylamine-DM-APAP (TYLENOL COLD MULTI-SYMPTOM) 5-6.25-10-325 MG/15ML LIQD Take by mouth.       No facility-administered medications prior to visit.    Review of Systems:    GEN: No fevers, chills. Nontoxic. Primarily MSK c/o today. MSK: Detailed in the HPI GI: tolerating PO intake without difficulty Neuro: detailed above Otherwise the pertinent positives of the ROS are noted above.    Physical Examination: Filed Vitals:   04/17/13 0924  BP: 100/60  Pulse: 101  Temp: 98.8 F (37.1 C)  TempSrc: Oral  Height: 5' 2.75" (1.594 m)  Weight: 202 lb 4 oz (91.74 kg)      GEN: Well-developed,well-nourished,in no acute distress; alert,appropriate and cooperative throughout examination HEENT: Normocephalic and atraumatic  without obvious abnormalities. Ears, externally no deformities PULM: Breathing comfortably in no respiratory distress EXT: No clubbing, cyanosis, or edema PSYCH: Normally interactive. Cooperative during the interview. Pleasant. Friendly and conversant. Not anxious or depressed appearing. Normal, full affect.  Range of motion at  the waist: Flexion, extension, lateral bending and rotation: limited in flexion to 55 degrees, painful extension, lateral bending and rotation mildly limited.  No echymosis or edema Rises to examination table with mild difficulty Gait: minimally antalgic  Inspection/Deformity: N Paraspinus Tenderness: diffuse l4-s1  B Ankle Dorsiflexion (L5,4): 5/5 B Great Toe Dorsiflexion (L5,4): 5/5 Heel Walk (L5): WNL Toe Walk (S1): WNL Rise/Squat (L4): WNL, mild pain  SENSORY B Medial Foot (L4): WNL B Dorsum (L5): WNL B Lateral (S1): WNL Light Touch: WNL Pinprick: WNL  REFLEXES Knee (L4): 2+ Ankle (S1): 2+  B SLR, seated: neg B SLR, supine: pain, non radicular B FABER: neg B Reverse FABER: Tender B Greater Troch: NT B Log Roll: neg B Stork: NT B Sciatic Notch: NT   HIP EXAM: SIDE: B ROM: Abduction, Flexion, Internal and External range of motion: full Pain with terminal IROM and EROM: no GTB: NT SLR: NEG Knees: No effusion FABER: NT REVERSE FABER: NT, neg Piriformis: NOTABLY TENDER ON THE LEFT AT PIRIFORMIS AND CAUDAL ALONG GLUTE MEDIUS INSERTION ON POSTERIOR PELVIC Str: flexion: 5/5 abduction: 5/5 adduction: 5/5 Strength testing non-tender     Dg Lumbar Spine Complete  04/17/2013   CLINICAL DATA:  Back pain. Left radiculopathy.  EXAM: LUMBAR SPINE - COMPLETE 4+ VIEW  COMPARISON:  None.  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 Mild facet arthropathy on the left at L5-S1. No pars defects, fracture, or malalignment identified.  IMPRESSION: 1. Lleft facet arthropathy at L5-S1. 2. 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   Electronically Signed   By: Chelsea Nolan   On:  04/17/2013 11:33    Assessment and Plan:  Impression: 1. Lumbar discogenic back pain vs. Piriformis on the Left   Recommendations: Conservative trial. Given hip and back rehab to begin daily. Lyrica 75 mg qhs, then transition to bid. Flexeril 5-10 mg qhs.   No red flags. If conservative management works, I discussed with her she would likely not achieve symptom relief for 2-3 months.  We will see the patient back in 6 weeks or sooner if she worsens.  Thank you for having [place] see Chelsea Nolan in consultation.  Feel free to contact me with any questions.  Signed, Elpidio Galea. Brityn Mastrogiovanni, MD, CAQ Sports Medicine [employer] at [facility] [address] [place]  [place], [place] [address] Phone: [phone] Fax: [phone]

## 2013-04-18 ENCOUNTER — Encounter: Payer: Self-pay | Admitting: Internal Medicine

## 2013-04-24 ENCOUNTER — Other Ambulatory Visit (INDEPENDENT_AMBULATORY_CARE_PROVIDER_SITE_OTHER): Payer: 59

## 2013-04-24 DIAGNOSIS — I10 Essential (primary) hypertension: Secondary | ICD-10-CM

## 2013-04-24 LAB — BASIC METABOLIC PANEL
BUN: 8 mg/dL (ref 6–23)
CO2: 27 mEq/L (ref 19–32)
Calcium: 9.2 mg/dL (ref 8.4–10.5)
Chloride: 104 mEq/L (ref 96–112)
Creatinine, Ser: 0.7 mg/dL (ref 0.4–1.2)
Glucose, Bld: 128 mg/dL — ABNORMAL HIGH (ref 70–99)

## 2013-05-03 ENCOUNTER — Ambulatory Visit (INDEPENDENT_AMBULATORY_CARE_PROVIDER_SITE_OTHER): Payer: 59 | Admitting: Internal Medicine

## 2013-05-03 ENCOUNTER — Encounter: Payer: Self-pay | Admitting: Internal Medicine

## 2013-05-03 VITALS — BP 114/80 | HR 105 | Temp 99.0°F | Wt 200.0 lb

## 2013-05-03 DIAGNOSIS — L4050 Arthropathic psoriasis, unspecified: Secondary | ICD-10-CM

## 2013-05-03 DIAGNOSIS — E669 Obesity, unspecified: Secondary | ICD-10-CM

## 2013-05-03 DIAGNOSIS — I10 Essential (primary) hypertension: Secondary | ICD-10-CM

## 2013-05-03 MED ORDER — PHENTERMINE HCL 37.5 MG PO CAPS: 37.5000 mg | ORAL_CAPSULE | ORAL | Status: DC

## 2013-05-03 NOTE — Progress Notes (Signed)
Subjective:    Patient ID: Chelsea Nolan, female    DOB: Jul 03, 1981, 32 y.o.   MRN: [id number]  HPI 32YO female with h/o hypertension, Psoriatic arthritis, and obesity presents for follow up. She recently had some trouble with nausea after taking methotrexate. Her rheumatologist started her on Phenergan with some improvement in her symptoms. She is also trying to take the methotrexate at night time which has led to some improvement. Aside from this, she reports she is feeling well. She continues to try to follow a healthier diet and increase her physical activity to lose weight.  Outpatient Encounter Prescriptions as of 05/03/2013  Medication Sig Dispense Refill  . folic acid (FOLVITE) 1 MG tablet Take 1 mg by mouth. Take 1 tablet (1 mg total) by mouth daily.      Marland Kitchen FOLIC ACID PO Take 1 mg by mouth. Take 1 tablet (1 mg total) by mouth daily.      Marland Kitchen lisinopril (PRINIVIL,ZESTRIL) 40 MG tablet Take 1 tablet (40 mg total) by mouth daily.  90 tablet  3  . loratadine (CLARITIN) 10 MG tablet Take 10 mg by mouth daily.      . methotrexate (RHEUMATREX) 2.5 MG tablet Start 4 pills one day per week x 2 weeks, then 6 pills one day per week x 2 weeks, then fill other rx.      . methotrexate (RHEUMATREX) 2.5 MG tablet Take 15 mg by mouth. Take 6 tablets (15 mg total) by mouth once a week.      . promethazine (PHENERGAN) 12.5 MG tablet Take 1 tablet 1 hour after MTX dose, and a second tablet as needed that day for n/v related to MTX.      Marland Kitchen SUMAtriptan (IMITREX) 50 MG tablet Take 50 mg by mouth. Take 50 mg by mouth. Frequency:PHARMDIR   Dosage:50   MG  Instructions:  Note:Taking 1 when aura begins and may repeat in one hour if needed Dose: 50MG       . phentermine 37.5 MG capsule Take 1 capsule (37.5 mg total) by mouth every morning.  30 capsule  0  . pregabalin (LYRICA) 75 MG capsule Take 1 capsule (75 mg total) by mouth 2 (two) times daily.  60 capsule  3   No facility-administered encounter medications on file as  of 05/03/2013.   BP 114/80  Pulse 105  Temp(Src) 99 F (37.2 C) (Oral)  Wt 200 lb (90.719 kg)  BMI 35.7 kg/m2  SpO2 96%  LMP 03/22/2013  Review of Systems  Constitutional: Negative for fever, chills, appetite change, fatigue and unexpected weight change.  HENT: Negative for ear pain, congestion, sore throat, trouble swallowing, neck pain, voice change and sinus pressure.   Eyes: Negative for visual disturbance.  Respiratory: Negative for cough, shortness of breath, wheezing and stridor.   Cardiovascular: Negative for chest pain, palpitations and leg swelling.  Gastrointestinal: Negative for nausea, vomiting, abdominal pain, diarrhea, constipation, blood in stool, abdominal distention and anal bleeding.  Genitourinary: Negative for dysuria and flank pain.  Musculoskeletal: Positive for arthralgias. Negative for myalgias and gait problem.  Skin: Negative for color change and rash.  Neurological: Negative for dizziness and headaches.  Hematological: Negative for adenopathy. Does not bruise/bleed easily.  Psychiatric/Behavioral: Negative for suicidal ideas, sleep disturbance and dysphoric mood. The patient is not nervous/anxious.        Objective:   Physical Exam  Constitutional: She is oriented to person, place, and time. She appears well-developed and well-nourished. No distress.  HENT:  Head:  Normocephalic and atraumatic.  Right Ear: External ear normal.  Left Ear: External ear normal.  Nose: Nose normal.  Mouth/Throat: Oropharynx is clear and moist. No oropharyngeal exudate.  Eyes: Conjunctivae are normal. Pupils are equal, round, and reactive to light. Right eye exhibits no discharge. Left eye exhibits no discharge. No scleral icterus.  Neck: Normal range of motion. Neck supple. No tracheal deviation present. No thyromegaly present.  Cardiovascular: Normal rate, regular rhythm, normal heart sounds and intact distal pulses.  Exam reveals no gallop and no friction rub.   No  murmur heard. Pulmonary/Chest: Effort normal and breath sounds normal. No accessory muscle usage. Not tachypneic. No respiratory distress. She has no decreased breath sounds. She has no wheezes. She has no rhonchi. She has no rales. She exhibits no tenderness.  Musculoskeletal: Normal range of motion. She exhibits no edema and no tenderness.  Lymphadenopathy:    She has no cervical adenopathy.  Neurological: She is alert and oriented to person, place, and time. No cranial nerve deficit. She exhibits normal muscle tone. Coordination normal.  Skin: Skin is warm and dry. No rash noted. She is not diaphoretic. No erythema. No pallor.  Psychiatric: She has a normal mood and affect. Her behavior is normal. Judgment and thought content normal.          Assessment & Plan:

## 2013-05-03 NOTE — Assessment & Plan Note (Signed)
Having some trouble with nausea with use of methotrexate, however symptoms recently improved and controlled with prn phenergan. Will continue to monitor. She will also continue to follow with rheumatology.

## 2013-05-03 NOTE — Assessment & Plan Note (Signed)
Wt Readings from Last 3 Encounters:  05/03/13 200 lb (90.719 kg)  04/17/13 202 lb 4 oz (91.74 kg)  04/05/13 203 lb (92.08 kg)     Encouraged continued effort at healthy diet and regular physical activity. Will start phentermine to help with appetite suppression. Discussed potential risks of this medication. She will monitor BP at home and RTC in 3 weeks for BP and weight check.

## 2013-05-03 NOTE — Assessment & Plan Note (Signed)
BP Readings from Last 3 Encounters:  05/03/13 114/80  04/17/13 100/60  04/05/13 128/98   BP very well controlled on lisinopril. Will continue.

## 2013-05-24 ENCOUNTER — Ambulatory Visit: Payer: 59 | Admitting: Family Medicine

## 2013-05-24 DIAGNOSIS — Z0289 Encounter for other administrative examinations: Secondary | ICD-10-CM

## 2013-05-26 ENCOUNTER — Ambulatory Visit: Payer: 59 | Admitting: Internal Medicine

## 2013-05-31 ENCOUNTER — Encounter: Payer: Self-pay | Admitting: Internal Medicine

## 2013-06-05 ENCOUNTER — Encounter: Payer: Self-pay | Admitting: Internal Medicine

## 2013-06-05 ENCOUNTER — Other Ambulatory Visit: Payer: Self-pay | Admitting: Internal Medicine

## 2013-06-05 NOTE — Telephone Encounter (Signed)
Okay to refill? Last filled on 05/03/13

## 2013-06-12 ENCOUNTER — Ambulatory Visit (INDEPENDENT_AMBULATORY_CARE_PROVIDER_SITE_OTHER): Payer: 59 | Admitting: Internal Medicine

## 2013-06-12 ENCOUNTER — Encounter: Payer: Self-pay | Admitting: Internal Medicine

## 2013-06-12 VITALS — BP 108/72 | HR 99 | Temp 98.6°F | Wt 190.0 lb

## 2013-06-12 DIAGNOSIS — M62838 Other muscle spasm: Secondary | ICD-10-CM

## 2013-06-12 DIAGNOSIS — I10 Essential (primary) hypertension: Secondary | ICD-10-CM

## 2013-06-12 DIAGNOSIS — E669 Obesity, unspecified: Secondary | ICD-10-CM

## 2013-06-12 LAB — CBC WITH DIFFERENTIAL/PLATELET
Basophils Relative: 0.3 % (ref 0.0–3.0)
Eosinophils Relative: 4.2 % (ref 0.0–5.0)
HCT: 43 % (ref 36.0–46.0)
Hemoglobin: 14.8 g/dL (ref 12.0–15.0)
Lymphs Abs: 4 10*3/uL (ref 0.7–4.0)
MCV: 95.7 fl (ref 78.0–100.0)
Monocytes Absolute: 0.9 10*3/uL (ref 0.1–1.0)
Monocytes Relative: 7.8 % (ref 3.0–12.0)
Neutro Abs: 6.6 10*3/uL (ref 1.4–7.7)
Neutrophils Relative %: 54.8 % (ref 43.0–77.0)
Platelets: 368 10*3/uL (ref 150.0–400.0)
WBC: 12.1 10*3/uL — ABNORMAL HIGH (ref 4.5–10.5)

## 2013-06-12 LAB — COMPREHENSIVE METABOLIC PANEL
AST: 32 U/L (ref 0–37)
Albumin: 4.1 g/dL (ref 3.5–5.2)
Alkaline Phosphatase: 97 U/L (ref 39–117)
BUN: 9 mg/dL (ref 6–23)
Calcium: 10 mg/dL (ref 8.4–10.5)
Glucose, Bld: 89 mg/dL (ref 70–99)
Potassium: 4.7 mEq/L (ref 3.5–5.1)
Sodium: 135 mEq/L (ref 135–145)
Total Bilirubin: 1 mg/dL (ref 0.3–1.2)
Total Protein: 7.4 g/dL (ref 6.0–8.3)

## 2013-06-12 LAB — TSH: TSH: 2.57 u[IU]/mL (ref 0.35–5.50)

## 2013-06-12 MED ORDER — PHENTERMINE HCL 37.5 MG PO CAPS: 37.5000 mg | ORAL_CAPSULE | ORAL | Status: DC

## 2013-06-12 MED ORDER — BUTALBITAL-ACETAMINOPHEN 50-325 MG PO TABS: 1.0000 | ORAL_TABLET | Freq: Two times a day (BID) | ORAL | Status: DC | PRN

## 2013-06-12 NOTE — Assessment & Plan Note (Signed)
BP Readings from Last 3 Encounters:  06/12/13 108/72  05/03/13 114/80  04/17/13 100/60   BP well controlled. Will continue Lisinopril.

## 2013-06-12 NOTE — Progress Notes (Signed)
Subjective:    Patient ID: Chelsea Nolan, female    DOB: Apr 28, 1981, 32 y.o.   MRN: [id number]  HPI 32YO female with obesity presents for follow up. Generally feeling well. Has adopted a healthy diet, yogurt for breakfast, vegetables for lunch and sensible dinner. Using phentermine with no noted side effects.   Concerned today about recent increase in muscle cramps in bilateral feet and hands. Often occurs at night, esp in feet.  Not taking anything for this. Symptoms resolve after a few minutes without intervention.  Outpatient Encounter Prescriptions as of 06/12/2013  Medication Sig  . FOLIC ACID PO Take 1 mg by mouth. Take 1 tablet (1 mg total) by mouth daily.  Marland Kitchen lisinopril (PRINIVIL,ZESTRIL) 40 MG tablet Take 1 tablet (40 mg total) by mouth daily.  Marland Kitchen loratadine (CLARITIN) 10 MG tablet Take 10 mg by mouth daily.  . methotrexate (RHEUMATREX) 2.5 MG tablet Start 4 pills one day per week x 2 weeks, then 6 pills one day per week x 2 weeks, then fill other rx.  . methotrexate (RHEUMATREX) 2.5 MG tablet Take 15 mg by mouth. Take 6 tablets (15 mg total) by mouth once a week.  . phentermine 37.5 MG capsule Take 1 capsule (37.5 mg total) by mouth every morning.  . pregabalin (LYRICA) 75 MG capsule Take 1 capsule (75 mg total) by mouth 2 (two) times daily.  . promethazine (PHENERGAN) 12.5 MG tablet Take 1 tablet 1 hour after MTX dose, and a second tablet as needed that day for n/v related to MTX.  Marland Kitchen SUMAtriptan (IMITREX) 50 MG tablet Take 50 mg by mouth. Take 50 mg by mouth. Frequency:PHARMDIR   Dosage:50   MG  Instructions:  Note:Taking 1 when aura begins and may repeat in one hour if needed Dose: 50MG    BP 108/72  Pulse 99  Temp(Src) 98.6 F (37 C) (Oral)  Wt 190 lb (86.183 kg)  SpO2 97%  LMP 05/23/2013  Review of Systems  Constitutional: Negative for fever, chills, appetite change, fatigue and unexpected weight change.  HENT: Negative for congestion, ear pain, sinus pressure, sore throat,  trouble swallowing and voice change.   Eyes: Negative for visual disturbance.  Respiratory: Negative for cough, shortness of breath, wheezing and stridor.   Cardiovascular: Negative for chest pain, palpitations and leg swelling.  Gastrointestinal: Negative for nausea, vomiting, abdominal pain, diarrhea, constipation, blood in stool, abdominal distention and anal bleeding.  Genitourinary: Negative for dysuria and flank pain.  Musculoskeletal: Positive for myalgias. Negative for arthralgias, gait problem and neck pain.  Skin: Negative for color change and rash.  Neurological: Negative for dizziness and headaches.  Hematological: Negative for adenopathy. Does not bruise/bleed easily.  Psychiatric/Behavioral: Negative for suicidal ideas, sleep disturbance and dysphoric mood. The patient is not nervous/anxious.        Objective:   Physical Exam  Constitutional: She is oriented to person, place, and time. She appears well-developed and well-nourished. No distress.  HENT:  Head: Normocephalic and atraumatic.  Right Ear: External ear normal.  Left Ear: External ear normal.  Nose: Nose normal.  Mouth/Throat: Oropharynx is clear and moist. No oropharyngeal exudate.  Eyes: Conjunctivae are normal. Pupils are equal, round, and reactive to light. Right eye exhibits no discharge. Left eye exhibits no discharge. No scleral icterus.  Neck: Normal range of motion. Neck supple. No tracheal deviation present. No thyromegaly present.  Cardiovascular: Normal rate, regular rhythm, normal heart sounds and intact distal pulses.  Exam reveals no gallop and no friction rub.  No murmur heard. Pulmonary/Chest: Effort normal and breath sounds normal. No accessory muscle usage. Not tachypneic. No respiratory distress. She has no decreased breath sounds. She has no wheezes. She has no rhonchi. She has no rales. She exhibits no tenderness.  Musculoskeletal: Normal range of motion. She exhibits no edema and no  tenderness.  Lymphadenopathy:    She has no cervical adenopathy.  Neurological: She is alert and oriented to person, place, and time. No cranial nerve deficit. She exhibits normal muscle tone. Coordination normal.  Skin: Skin is warm and dry. No rash noted. She is not diaphoretic. No erythema. No pallor.  Psychiatric: She has a normal mood and affect. Her behavior is normal. Judgment and thought content normal.          Assessment & Plan:

## 2013-06-12 NOTE — Assessment & Plan Note (Signed)
  Wt Readings from Last 3 Encounters:  06/12/13 190 lb (86.183 kg)  05/03/13 200 lb (90.719 kg)  04/17/13 202 lb 4 oz (91.74 kg)   Body mass index is 33.92 kg/(m^2).  Congratulated pt on weight loss. Will continue phentermine for appetite suppression. Follow up 1-2 months for BP and weight check.

## 2013-06-12 NOTE — Progress Notes (Signed)
Pre-visit discussion using our clinic review tool. No additional management support is needed unless otherwise documented below in the visit note.  

## 2013-06-12 NOTE — Assessment & Plan Note (Signed)
Pt reports recent muscle spasms of feet and legs. Will check electrolytes, TSH, B12 with labs.

## 2013-06-19 ENCOUNTER — Encounter: Payer: Self-pay | Admitting: Internal Medicine

## 2013-06-27 ENCOUNTER — Emergency Department: Payer: Self-pay | Admitting: Emergency Medicine

## 2013-06-27 LAB — CBC WITH DIFFERENTIAL/PLATELET
Basophil #: 0.1 10*3/uL (ref 0.0–0.1)
Basophil %: 0.6 %
Eosinophil %: 4.2 %
HCT: 42.1 % (ref 35.0–47.0)
HGB: 14.4 g/dL (ref 12.0–16.0)
Lymphocyte %: 41.4 %
MCH: 32.9 pg (ref 26.0–34.0)
Monocyte #: 0.8 x10 3/mm (ref 0.2–0.9)
Monocyte %: 5.8 %
Neutrophil #: 6.7 10*3/uL — ABNORMAL HIGH (ref 1.4–6.5)
Neutrophil %: 48 %
Platelet: 349 10*3/uL (ref 150–440)
RBC: 4.36 10*6/uL (ref 3.80–5.20)
RDW: 13.9 % (ref 11.5–14.5)

## 2013-06-27 LAB — URINALYSIS, COMPLETE
Bilirubin,UR: NEGATIVE
Ketone: NEGATIVE
Leukocyte Esterase: NEGATIVE
Nitrite: NEGATIVE
Ph: 6 (ref 4.5–8.0)
Protein: NEGATIVE
RBC,UR: 1 /HPF (ref 0–5)
Specific Gravity: 1.015 (ref 1.003–1.030)
Squamous Epithelial: 8
WBC UR: 3 /HPF (ref 0–5)

## 2013-06-27 LAB — COMPREHENSIVE METABOLIC PANEL
Alkaline Phosphatase: 113 U/L
Anion Gap: 4 — ABNORMAL LOW (ref 7–16)
BUN: 10 mg/dL (ref 7–18)
Bilirubin,Total: 0.4 mg/dL (ref 0.2–1.0)
Calcium, Total: 9.3 mg/dL (ref 8.5–10.1)
EGFR (African American): >60
Glucose: 91 mg/dL (ref 65–99)
Osmolality: 271 (ref 275–301)
Potassium: 3.7 mmol/L (ref 3.5–5.1)
SGPT (ALT): 38 U/L (ref 12–78)
Sodium: 136 mmol/L (ref 136–145)
Total Protein: 8.3 g/dL — ABNORMAL HIGH (ref 6.4–8.2)

## 2013-06-27 LAB — LIPASE, BLOOD: Lipase: 105 U/L (ref 73–393)

## 2013-07-07 ENCOUNTER — Telehealth: Payer: Self-pay | Admitting: *Deleted

## 2013-07-07 MED ORDER — BUTALBITAL-APAP-CAFFEINE 50-325-40 MG PO TABS: 1.0000 | ORAL_TABLET | Freq: Two times a day (BID) | ORAL | Status: DC | PRN

## 2013-07-07 NOTE — Telephone Encounter (Signed)
New prescription sent to pharmacy and patient is aware of change.

## 2013-07-07 NOTE — Telephone Encounter (Signed)
Fine to change to the generic fiorcet with caffeine, however we just need to let pt know about the change.

## 2013-07-07 NOTE — Telephone Encounter (Signed)
Pharmacist Charles left a message stating the Acetaminophen-Butalbital 50/325 mg is not available. Not sure if it is not being manufactured anymore or if it is on backorder. However was able to give her a few but would like to know if you would switch her to something else. The generic Fioricet is ready, the only difference is this has caffeine in it.

## 2013-07-11 ENCOUNTER — Encounter: Payer: Self-pay | Admitting: Internal Medicine

## 2013-07-21 ENCOUNTER — Encounter: Payer: Self-pay | Admitting: Internal Medicine

## 2013-07-24 ENCOUNTER — Other Ambulatory Visit: Payer: Self-pay | Admitting: Internal Medicine

## 2013-08-14 ENCOUNTER — Encounter: Payer: Self-pay | Admitting: Internal Medicine

## 2013-08-14 ENCOUNTER — Ambulatory Visit (INDEPENDENT_AMBULATORY_CARE_PROVIDER_SITE_OTHER): Payer: 59 | Admitting: Internal Medicine

## 2013-08-14 VITALS — BP 120/82 | HR 114 | Temp 98.3°F | Wt 188.0 lb

## 2013-08-14 DIAGNOSIS — E669 Obesity, unspecified: Secondary | ICD-10-CM

## 2013-08-14 DIAGNOSIS — R1902 Left upper quadrant abdominal swelling, mass and lump: Secondary | ICD-10-CM

## 2013-08-14 DIAGNOSIS — I10 Essential (primary) hypertension: Secondary | ICD-10-CM

## 2013-08-14 MED ORDER — BUTALBITAL-APAP-CAFFEINE 50-325-40 MG PO TABS
1.0000 | ORAL_TABLET | Freq: Two times a day (BID) | ORAL | Status: DC | PRN
Start: 2013-08-14 — End: 2013-11-15

## 2013-08-14 MED ORDER — PHENTERMINE HCL 37.5 MG PO CAPS: 37.5000 mg | ORAL_CAPSULE | ORAL | Status: DC

## 2013-08-14 MED ORDER — LISINOPRIL 40 MG PO TABS: 40.0000 mg | ORAL_TABLET | Freq: Every day | ORAL | Status: DC

## 2013-08-14 NOTE — Assessment & Plan Note (Signed)
Wt Readings from Last 3 Encounters:  08/14/13 188 lb (85.276 kg)  06/12/13 190 lb (86.183 kg)  05/03/13 200 lb (90.719 kg)   Congratulated pt on weight loss. Encouraged her to continue with healthy diet and regular exercise. Will continue phentermine to help with appetite suppression.

## 2013-08-14 NOTE — Progress Notes (Signed)
Subjective:    Patient ID: Chelsea Nolan, female    DOB: January 24, 1981, 33 y.o.   MRN: [id number]  HPI 33YO female with h/o psoriatic arthritis, hypertension, and obesity presents for follow up. Doing well in general. Trying to follow a healthy diet and exercise program. Using phentermine for appetite suppression. No side effects noted.  Concerned today about nodule in left upper abdomen. First noticed this a few weeks ago. Not sure how long this has been present. Non-tender. No overlying skin changes. No trauma to abdomen.  Outpatient Prescriptions Prior to Visit  Medication Sig Dispense Refill  . FOLIC ACID PO Take 1 mg by mouth. Take 1 tablet (1 mg total) by mouth daily.      Marland Kitchen loratadine (CLARITIN) 10 MG tablet Take 10 mg by mouth daily.      . methotrexate (RHEUMATREX) 2.5 MG tablet Start 4 pills one day per week x 2 weeks, then 6 pills one day per week x 2 weeks, then fill other rx.      . methotrexate (RHEUMATREX) 2.5 MG tablet Take 15 mg by mouth. Take 6 tablets (15 mg total) by mouth once a week.      . SUMAtriptan (IMITREX) 50 MG tablet Take 50 mg by mouth. Take 50 mg by mouth. Frequency:PHARMDIR   Dosage:50   MG  Instructions:  Note:Taking 1 when aura begins and may repeat in one hour if needed Dose: 50MG       . butalbital-acetaminophen-caffeine (FIORICET, ESGIC) 50-325-40 MG per tablet Take 1 tablet by mouth 2 (two) times daily as needed for headache.  60 tablet  0  . lisinopril (PRINIVIL,ZESTRIL) 40 MG tablet Take 1 tablet (40 mg total) by mouth daily.  90 tablet  3  . phentermine 37.5 MG capsule Take 1 capsule (37.5 mg total) by mouth every morning.  30 capsule  1  . ACETAMINOPHEN-BUTALBITAL 50-325 MG TABS Take 1 tablet by mouth 2 (two) times daily as needed.  60 each  1  . folic acid (FOLVITE) 1 MG tablet Take 1 mg by mouth. Take 1 tablet (1 mg total) by mouth daily.      . pregabalin (LYRICA) 75 MG capsule Take 1 capsule (75 mg total) by mouth 2 (two) times daily.  60 capsule  3  .  promethazine (PHENERGAN) 12.5 MG tablet Take 1 tablet 1 hour after MTX dose, and a second tablet as needed that day for n/v related to MTX.       No facility-administered medications prior to visit.   BP 120/82  Pulse 114  Temp(Src) 98.3 F (36.8 C) (Oral)  Wt 188 lb (85.276 kg)  SpO2 98%  Review of Systems  Constitutional: Negative for fever, chills, appetite change, fatigue and unexpected weight change.  HENT: Negative for congestion, ear pain, sinus pressure, sore throat, trouble swallowing and voice change.   Eyes: Negative for visual disturbance.  Respiratory: Negative for cough, shortness of breath, wheezing and stridor.   Cardiovascular: Negative for chest pain, palpitations and leg swelling.  Gastrointestinal: Negative for nausea, vomiting, abdominal pain, diarrhea, constipation, blood in stool, abdominal distention and anal bleeding.  Genitourinary: Negative for dysuria and flank pain.  Musculoskeletal: Negative for arthralgias, gait problem, myalgias and neck pain.  Skin: Negative for color change and rash.  Neurological: Negative for dizziness and headaches.  Hematological: Negative for adenopathy. Does not bruise/bleed easily.  Psychiatric/Behavioral: Negative for suicidal ideas, sleep disturbance and dysphoric mood. The patient is not nervous/anxious.  Objective:   Physical Exam  Constitutional: She is oriented to person, place, and time. She appears well-developed and well-nourished. No distress.  HENT:  Head: Normocephalic and atraumatic.  Right Ear: External ear normal.  Left Ear: External ear normal.  Nose: Nose normal.  Mouth/Throat: Oropharynx is clear and moist. No oropharyngeal exudate.  Eyes: Conjunctivae are normal. Pupils are equal, round, and reactive to light. Right eye exhibits no discharge. Left eye exhibits no discharge. No scleral icterus.  Neck: Normal range of motion. Neck supple. No tracheal deviation present. No thyromegaly present.    Cardiovascular: Normal rate, regular rhythm, normal heart sounds and intact distal pulses.  Exam reveals no gallop and no friction rub.   No murmur heard. Pulmonary/Chest: Effort normal and breath sounds normal. No accessory muscle usage. Not tachypneic. No respiratory distress. She has no decreased breath sounds. She has no wheezes. She has no rhonchi. She has no rales. She exhibits no tenderness.  Abdominal:    Musculoskeletal: Normal range of motion. She exhibits no edema and no tenderness.  Lymphadenopathy:    She has no cervical adenopathy.  Neurological: She is alert and oriented to person, place, and time. No cranial nerve deficit. She exhibits normal muscle tone. Coordination normal.  Skin: Skin is warm and dry. No rash noted. She is not diaphoretic. No erythema. No pallor.  Psychiatric: She has a normal mood and affect. Her behavior is normal. Judgment and thought content normal.          Assessment & Plan:

## 2013-08-14 NOTE — Assessment & Plan Note (Signed)
BP Readings from Last 3 Encounters:  08/14/13 120/82  06/12/13 108/72  05/03/13 114/80   BP well controlled on Lisinopril. Will continue.

## 2013-08-14 NOTE — Assessment & Plan Note (Signed)
Pt recently noted a mass in the left upper abdomen. 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 Will set up ultrasound evaluation.

## 2013-08-14 NOTE — Progress Notes (Signed)
Pre-visit discussion using our clinic review tool. No additional management support is needed unless otherwise documented below in the visit note.  

## 2013-08-17 ENCOUNTER — Ambulatory Visit: Payer: Self-pay | Admitting: Internal Medicine

## 2013-08-31 ENCOUNTER — Encounter: Payer: Self-pay | Admitting: Internal Medicine

## 2013-09-02 ENCOUNTER — Telehealth: Payer: Self-pay | Admitting: Internal Medicine

## 2013-09-02 NOTE — Telephone Encounter (Signed)
Relevant patient education assigned to patient using Emmi. ° °

## 2013-09-06 ENCOUNTER — Telehealth: Payer: Self-pay | Admitting: Internal Medicine

## 2013-09-06 NOTE — Telephone Encounter (Signed)
Relevant patient education assigned to patient using Emmi. ° °

## 2013-09-07 MED ORDER — METHOTREXATE 2.5 MG PO TABS: 15.0000 mg | ORAL_TABLET | ORAL | Status: DC

## 2013-09-08 ENCOUNTER — Encounter: Payer: Self-pay | Admitting: Internal Medicine

## 2013-10-03 ENCOUNTER — Emergency Department: Payer: Self-pay | Admitting: Emergency Medicine

## 2013-10-03 LAB — COMPREHENSIVE METABOLIC PANEL
ALK PHOS: 117 U/L
ALT: 28 U/L (ref 12–78)
Albumin: 3.6 g/dL (ref 3.4–5.0)
Anion Gap: 5 — ABNORMAL LOW (ref 7–16)
BUN: 7 mg/dL (ref 7–18)
Bilirubin,Total: 0.3 mg/dL (ref 0.2–1.0)
CREATININE: 0.66 mg/dL (ref 0.60–1.30)
Calcium, Total: 8.6 mg/dL (ref 8.5–10.1)
Chloride: 104 mmol/L (ref 98–107)
Co2: 28 mmol/L (ref 21–32)
EGFR (Non-African Amer.): >60
GLUCOSE: 78 mg/dL (ref 65–99)
Osmolality: 271 (ref 275–301)
Potassium: 4 mmol/L (ref 3.5–5.1)
SGOT(AST): 31 U/L (ref 15–37)
SODIUM: 137 mmol/L (ref 136–145)
Total Protein: 7.8 g/dL (ref 6.4–8.2)

## 2013-10-03 LAB — URINALYSIS, COMPLETE
Bilirubin,UR: NEGATIVE
Blood: NEGATIVE
Glucose,UR: NEGATIVE mg/dL (ref 0–75)
Ketone: NEGATIVE
LEUKOCYTE ESTERASE: NEGATIVE
Nitrite: NEGATIVE
Ph: 7 (ref 4.5–8.0)
Protein: NEGATIVE
RBC,UR: 1 /HPF (ref 0–5)
Specific Gravity: 1.006 (ref 1.003–1.030)

## 2013-10-03 LAB — TROPONIN I

## 2013-10-03 LAB — CBC
HCT: 44.4 % (ref 35.0–47.0)
HGB: 14.6 g/dL (ref 12.0–16.0)
MCH: 32.7 pg (ref 26.0–34.0)
MCHC: 32.8 g/dL (ref 32.0–36.0)
MCV: 100 fL (ref 80–100)
PLATELETS: 338 10*3/uL (ref 150–440)
RBC: 4.45 10*6/uL (ref 3.80–5.20)
RDW: 14.3 % (ref 11.5–14.5)
WBC: 12.3 10*3/uL — ABNORMAL HIGH (ref 3.6–11.0)

## 2013-10-03 LAB — MAGNESIUM: Magnesium: 2 mg/dL

## 2013-10-03 LAB — ETHANOL
Ethanol %: <0.003 % (ref 0.000–0.080)
Ethanol: <3 mg/dL

## 2013-10-09 ENCOUNTER — Ambulatory Visit: Payer: 59 | Admitting: Internal Medicine

## 2013-10-25 ENCOUNTER — Other Ambulatory Visit: Payer: Self-pay | Admitting: Internal Medicine

## 2013-11-06 ENCOUNTER — Encounter: Payer: Self-pay | Admitting: *Deleted

## 2013-11-15 ENCOUNTER — Ambulatory Visit (INDEPENDENT_AMBULATORY_CARE_PROVIDER_SITE_OTHER): Payer: 59 | Admitting: Internal Medicine

## 2013-11-15 ENCOUNTER — Encounter: Payer: Self-pay | Admitting: Internal Medicine

## 2013-11-15 VITALS — BP 120/84 | HR 98 | Temp 98.3°F | Resp 14 | Wt 186.5 lb

## 2013-11-15 DIAGNOSIS — Z720 Tobacco use: Secondary | ICD-10-CM

## 2013-11-15 DIAGNOSIS — L4050 Arthropathic psoriasis, unspecified: Secondary | ICD-10-CM

## 2013-11-15 DIAGNOSIS — I10 Essential (primary) hypertension: Secondary | ICD-10-CM

## 2013-11-15 DIAGNOSIS — N920 Excessive and frequent menstruation with regular cycle: Secondary | ICD-10-CM

## 2013-11-15 DIAGNOSIS — F17200 Nicotine dependence, unspecified, uncomplicated: Secondary | ICD-10-CM

## 2013-11-15 DIAGNOSIS — Z8041 Family history of malignant neoplasm of ovary: Secondary | ICD-10-CM | POA: Insufficient documentation

## 2013-11-15 DIAGNOSIS — E669 Obesity, unspecified: Secondary | ICD-10-CM

## 2013-11-15 MED ORDER — BUTALBITAL-APAP-CAFFEINE 50-325-40 MG PO TABS
1.0000 | ORAL_TABLET | Freq: Two times a day (BID) | ORAL | Status: DC | PRN
Start: 2013-11-15 — End: 2014-01-29

## 2013-11-15 MED ORDER — PHENTERMINE HCL 37.5 MG PO CAPS: 37.5000 mg | ORAL_CAPSULE | ORAL | Status: DC

## 2013-11-15 NOTE — Assessment & Plan Note (Signed)
BP Readings from Last 3 Encounters:  11/15/13 120/84  08/14/13 120/82  06/12/13 108/72   BP well controlled on current medication. Will continue.

## 2013-11-15 NOTE — Assessment & Plan Note (Signed)
Encouraged smoking cessation 

## 2013-11-15 NOTE — Progress Notes (Signed)
Pre visit review using our clinic review tool, if applicable. No additional management support is needed unless otherwise documented below in the visit note. 

## 2013-11-15 NOTE — Assessment & Plan Note (Signed)
Encouraged pt to follow up with rheumatology at [facility] as she did not tolerate methotrexate well.

## 2013-11-15 NOTE — Assessment & Plan Note (Signed)
Will set up GYN evaluation. Question if pt would be good candidate for BRCA screening and screening pelvic US.

## 2013-11-15 NOTE — Patient Instructions (Signed)
We will set up evaluation with gynecology.  Restart phentermine to help with appetite.  Let us know if you have trouble getting in contact with [facility].

## 2013-11-15 NOTE — Assessment & Plan Note (Signed)
Will set up GYN evaluation. Pt is not a good candidate for OCP given tobacco use. Question if IUD might be helpful for control of menstrual bleeding.

## 2013-11-15 NOTE — Assessment & Plan Note (Signed)
Wt Readings from Last 3 Encounters:  11/15/13 186 lb 8 oz (84.596 kg)  08/14/13 188 lb (85.276 kg)  06/12/13 190 lb (86.183 kg)   Congratulated pt on weight loss. Encouraged healthy diet and regular exercise. Will continue phentermine to help with appetite suppression. Follow up 2 months and prn.

## 2013-11-15 NOTE — Progress Notes (Signed)
Subjective:    Patient ID: Chelsea Nolan, female    DOB: September 11, 1980, 33 y.o.   MRN: [id number]  HPI 33YO female presents for follow up.  Psoriatic arthritis - started on Methotrexate. Felt "sick" on this medication. Recently passed out at work when on the medication. Stopped medication and symptoms improved. Continues to have aching joint and back pain.  Obesity - Off phentermine 2-3 weeks. Doing well. Notes increased appetite.  Menorrhagia - Notes heavy menses x over 1 year. Had abnormal PAP in distant past. Menses last about 6 days. Using tampon every 1-2 hours, occasionally with pad.  Review of Systems  Constitutional: Negative for fever, chills, appetite change, fatigue and unexpected weight change.  HENT: Negative for congestion, ear pain, sinus pressure, sore throat, trouble swallowing and voice change.   Eyes: Negative for visual disturbance.  Respiratory: Negative for cough, shortness of breath, wheezing and stridor.   Cardiovascular: Negative for chest pain, palpitations and leg swelling.  Gastrointestinal: Negative for nausea, vomiting, abdominal pain, diarrhea, constipation, blood in stool, abdominal distention and anal bleeding.  Genitourinary: Positive for menstrual problem. Negative for dysuria and flank pain.  Musculoskeletal: Positive for arthralgias. Negative for gait problem, myalgias and neck pain.  Skin: Negative for color change and rash.  Neurological: Negative for dizziness and headaches.  Hematological: Negative for adenopathy. Does not bruise/bleed easily.  Psychiatric/Behavioral: Negative for suicidal ideas, sleep disturbance and dysphoric mood. The patient is not nervous/anxious.        Objective:    BP 120/84  Pulse 98  Temp(Src) 98.3 F (36.8 C) (Oral)  Resp 14  Wt 186 lb 8 oz (84.596 kg)  SpO2 97% Physical Exam  Constitutional: She is oriented to person, place, and time. She appears well-developed and well-nourished. No distress.  HENT:  Head:  Normocephalic and atraumatic.  Right Ear: External ear normal.  Left Ear: External ear normal.  Nose: Nose normal.  Mouth/Throat: Oropharynx is clear and moist. No oropharyngeal exudate.  Eyes: Conjunctivae are normal. Pupils are equal, round, and reactive to light. Right eye exhibits no discharge. Left eye exhibits no discharge. No scleral icterus.  Neck: Normal range of motion. Neck supple. No tracheal deviation present. No thyromegaly present.  Cardiovascular: Normal rate, regular rhythm, normal heart sounds and intact distal pulses.  Exam reveals no gallop and no friction rub.   No murmur heard. Pulmonary/Chest: Effort normal and breath sounds normal. No accessory muscle usage. Not tachypneic. No respiratory distress. She has no decreased breath sounds. She has no wheezes. She has no rhonchi. She has no rales. She exhibits no tenderness.  Abdominal: Soft. Bowel sounds are normal. She exhibits no distension and no mass. There is no tenderness. There is no rebound and no guarding.  Musculoskeletal: Normal range of motion. She exhibits no edema and no tenderness.  Lymphadenopathy:    She has no cervical adenopathy.  Neurological: She is alert and oriented to person, place, and time. No cranial nerve deficit. She exhibits normal muscle tone. Coordination normal.  Skin: Skin is warm and dry. No rash noted. She is not diaphoretic. No erythema. No pallor.  Psychiatric: She has a normal mood and affect. Her behavior is normal. Judgment and thought content normal.          Assessment & Plan:   Problem List Items Addressed This Visit   Essential hypertension, benign      BP Readings from Last 3 Encounters:  11/15/13 120/84  08/14/13 120/82  06/12/13 108/72   BP  well controlled on current medication. Will continue.    Family history of ovarian cancer     Will set up GYN evaluation. Question if pt would be good candidate for BRCA screening and screening pelvic US.    Menorrhagia      Will set up GYN evaluation. Pt is not a good candidate for OCP given tobacco use. Question if IUD might be helpful for control of menstrual bleeding.    Relevant Orders      Ambulatory referral to Obstetrics / Gynecology   Obesity, unspecified - Primary      Wt Readings from Last 3 Encounters:  11/15/13 186 lb 8 oz (84.596 kg)  08/14/13 188 lb (85.276 kg)  06/12/13 190 lb (86.183 kg)   Congratulated pt on weight loss. Encouraged healthy diet and regular exercise. Will continue phentermine to help with appetite suppression. Follow up 2 months and prn.    Relevant Medications      phentermine capsule   Psoriatic arthritis     Encouraged pt to follow up with rheumatology at [facility] as she did not tolerate methotrexate well.    Tobacco abuse     Encouraged smoking cessation.        Return in about 2 months (around 01/15/2014).

## 2013-11-17 ENCOUNTER — Encounter: Payer: Self-pay | Admitting: Internal Medicine

## 2013-12-28 ENCOUNTER — Emergency Department: Payer: Self-pay | Admitting: Emergency Medicine

## 2014-01-19 ENCOUNTER — Ambulatory Visit (INDEPENDENT_AMBULATORY_CARE_PROVIDER_SITE_OTHER): Payer: 59 | Admitting: Internal Medicine

## 2014-01-19 ENCOUNTER — Encounter: Payer: Self-pay | Admitting: Internal Medicine

## 2014-01-19 VITALS — BP 106/70 | HR 98 | Resp 14 | Ht 62.75 in | Wt 182.2 lb

## 2014-01-19 DIAGNOSIS — N920 Excessive and frequent menstruation with regular cycle: Secondary | ICD-10-CM

## 2014-01-19 DIAGNOSIS — I10 Essential (primary) hypertension: Secondary | ICD-10-CM

## 2014-01-19 DIAGNOSIS — E669 Obesity, unspecified: Secondary | ICD-10-CM

## 2014-01-19 DIAGNOSIS — L4050 Arthropathic psoriasis, unspecified: Secondary | ICD-10-CM

## 2014-01-19 MED ORDER — PHENTERMINE HCL 37.5 MG PO CAPS: 37.5000 mg | ORAL_CAPSULE | ORAL | Status: DC

## 2014-01-19 NOTE — Assessment & Plan Note (Signed)
BP Readings from Last 3 Encounters:  01/19/14 106/70  11/15/13 120/84  08/14/13 120/82   BP well controlled with lisinopril. Will continue. Recent renal function normal

## 2014-01-19 NOTE — Assessment & Plan Note (Signed)
Wt Readings from Last 3 Encounters:  01/19/14 182 lb 4 oz (82.668 kg)  11/15/13 186 lb 8 oz (84.596 kg)  08/14/13 188 lb (85.276 kg)   Body mass index is 32.54 kg/(m^2). Encouraged continued healthy diet and exercise. Will continue phentermine. Follow up 3 months and prn.

## 2014-01-19 NOTE — Progress Notes (Signed)
Subjective:    Patient ID: Chelsea Nolan, female    DOB: Aug 18, 1980, 33 y.o.   MRN: [id number]  HPI 33YO female presents for follow up.  Menorrhagia - Seen by OB who recommended IUD. Pt does not want to pursue this. Continues to have heavy bleeding and pain. Has increased spotting through the month with Methotrexate. Plans to follow up with OB and ask about hysterectomy.  Psoriatic arthritis - started back on injected methotrexate. Tolerating well.  Concerned about insurance coverage for meds, as changing jobs and will have 3month lapse in insurance coverage.  Obesity - Continues to try to follow healthy diet. Taking phentermine with improvement in appetite. No side effects noted.   Review of Systems  Constitutional: Negative for fever, chills, appetite change, fatigue and unexpected weight change.  Eyes: Negative for visual disturbance.  Respiratory: Negative for shortness of breath.   Cardiovascular: Negative for chest pain and leg swelling.  Gastrointestinal: Negative for abdominal pain.  Skin: Negative for color change and rash.  Hematological: Negative for adenopathy. Does not bruise/bleed easily.  Psychiatric/Behavioral: Negative for dysphoric mood. The patient is not nervous/anxious.        Objective:    BP 106/70  Pulse 98  Resp 14  Ht 5' 2.75" (1.594 m)  Wt 182 lb 4 oz (82.668 kg)  BMI 32.54 kg/m2  SpO2 97% Physical Exam  Constitutional: She is oriented to person, place, and time. She appears well-developed and well-nourished. No distress.  HENT:  Head: Normocephalic and atraumatic.  Right Ear: External ear normal.  Left Ear: External ear normal.  Nose: Nose normal.  Mouth/Throat: Oropharynx is clear and moist. No oropharyngeal exudate.  Eyes: Conjunctivae are normal. Pupils are equal, round, and reactive to light. Right eye exhibits no discharge. Left eye exhibits no discharge. No scleral icterus.  Neck: Normal range of motion. Neck supple. No tracheal deviation  present. No thyromegaly present.  Cardiovascular: Normal rate, regular rhythm, normal heart sounds and intact distal pulses.  Exam reveals no gallop and no friction rub.   No murmur heard. Pulmonary/Chest: Effort normal and breath sounds normal. No accessory muscle usage. Not tachypneic. No respiratory distress. She has no decreased breath sounds. She has no wheezes. She has no rhonchi. She has no rales. She exhibits no tenderness.  Musculoskeletal: Normal range of motion. She exhibits no edema and no tenderness.  Lymphadenopathy:    She has no cervical adenopathy.  Neurological: She is alert and oriented to person, place, and time. No cranial nerve deficit. She exhibits normal muscle tone. Coordination normal.  Skin: Skin is warm and dry. No rash noted. She is not diaphoretic. No erythema. No pallor.  Psychiatric: She has a normal mood and affect. Her behavior is normal. Judgment and thought content normal.          Assessment & Plan:   Problem List Items Addressed This Visit     Unprioritized   Essential hypertension, benign      BP Readings from Last 3 Encounters:  01/19/14 106/70  11/15/13 120/84  08/14/13 120/82   BP well controlled with lisinopril. Will continue. Recent renal function normal    Menorrhagia - Primary     Encouraged her to follow up with GYN and discuss IUD versus hysterectomy, given risks of chronic menorrhagia with iron deficiency anemia.    Obesity, unspecified      Wt Readings from Last 3 Encounters:  01/19/14 182 lb 4 oz (82.668 kg)  11/15/13 186 lb 8 oz (84.596  kg)  08/14/13 188 lb (85.276 kg)   Body mass index is 32.54 kg/(m^2). Encouraged continued healthy diet and exercise. Will continue phentermine. Follow up 3 months and prn.    Relevant Medications      phentermine capsule   Psoriatic arthritis     Symptoms well controlled with Methotrexate. Will continue rheumatology follow up.        Return in about 3 months (around 04/21/2014) for  Recheck.

## 2014-01-19 NOTE — Progress Notes (Signed)
Pre visit review using our clinic review tool, if applicable. No additional management support is needed unless otherwise documented below in the visit note. 

## 2014-01-19 NOTE — Assessment & Plan Note (Signed)
Encouraged her to follow up with GYN and discuss IUD versus hysterectomy, given risks of chronic menorrhagia with iron deficiency anemia.

## 2014-01-19 NOTE — Assessment & Plan Note (Signed)
Symptoms well controlled with Methotrexate. Will continue rheumatology follow up.

## 2014-01-29 ENCOUNTER — Other Ambulatory Visit: Payer: Self-pay | Admitting: Internal Medicine

## 2014-01-29 NOTE — Telephone Encounter (Signed)
Last OV 6.19.15, last refill 6.3.15.  Please advise refill

## 2014-01-29 NOTE — Telephone Encounter (Signed)
Ok to fill 

## 2014-01-31 ENCOUNTER — Other Ambulatory Visit: Payer: Self-pay | Admitting: Internal Medicine

## 2014-02-01 NOTE — Telephone Encounter (Signed)
Last refill 6.3.15, last OV 6.19.15, future OV 9.21.15.  Please advise refill.

## 2014-02-01 NOTE — Telephone Encounter (Signed)
Rx faxed on 6.29.15.

## 2014-03-15 ENCOUNTER — Emergency Department: Payer: Self-pay | Admitting: Emergency Medicine

## 2014-03-15 LAB — URINALYSIS, COMPLETE
Bacteria: NONE SEEN
Bilirubin,UR: NEGATIVE
GLUCOSE, UR: NEGATIVE mg/dL (ref 0–75)
Ketone: NEGATIVE
Leukocyte Esterase: NEGATIVE
NITRITE: NEGATIVE
Ph: 5 (ref 4.5–8.0)
Protein: 30
RBC,UR: 722 /HPF (ref 0–5)
SPECIFIC GRAVITY: 1.024 (ref 1.003–1.030)
Squamous Epithelial: 6

## 2014-03-15 LAB — CBC
HCT: 41.9 % (ref 35.0–47.0)
HGB: 13.9 g/dL (ref 12.0–16.0)
MCH: 33.3 pg (ref 26.0–34.0)
MCHC: 33.1 g/dL (ref 32.0–36.0)
MCV: 101 fL — ABNORMAL HIGH (ref 80–100)
PLATELETS: 373 10*3/uL (ref 150–440)
RBC: 4.16 10*6/uL (ref 3.80–5.20)
RDW: 14.5 % (ref 11.5–14.5)
WBC: 12.9 10*3/uL — ABNORMAL HIGH (ref 3.6–11.0)

## 2014-03-15 LAB — COMPREHENSIVE METABOLIC PANEL
Albumin: 3.6 g/dL (ref 3.4–5.0)
Alkaline Phosphatase: 107 U/L
Anion Gap: 5 — ABNORMAL LOW (ref 7–16)
BUN: 9 mg/dL (ref 7–18)
Bilirubin,Total: 0.2 mg/dL (ref 0.2–1.0)
CHLORIDE: 107 mmol/L (ref 98–107)
CREATININE: 0.78 mg/dL (ref 0.60–1.30)
Calcium, Total: 8.8 mg/dL (ref 8.5–10.1)
Co2: 29 mmol/L (ref 21–32)
EGFR (African American): >60
EGFR (Non-African Amer.): >60
Glucose: 96 mg/dL (ref 65–99)
Osmolality: 280 (ref 275–301)
Potassium: 3.6 mmol/L (ref 3.5–5.1)
SGOT(AST): 29 U/L (ref 15–37)
SGPT (ALT): 24 U/L
SODIUM: 141 mmol/L (ref 136–145)
Total Protein: 7.8 g/dL (ref 6.4–8.2)

## 2014-04-01 ENCOUNTER — Encounter: Payer: Self-pay | Admitting: Internal Medicine

## 2014-04-02 ENCOUNTER — Ambulatory Visit (INDEPENDENT_AMBULATORY_CARE_PROVIDER_SITE_OTHER): Payer: 59 | Admitting: Internal Medicine

## 2014-04-02 ENCOUNTER — Encounter: Payer: Self-pay | Admitting: Internal Medicine

## 2014-04-02 VITALS — BP 140/72 | HR 109 | Temp 98.2°F | Ht 62.75 in | Wt 185.5 lb

## 2014-04-02 DIAGNOSIS — F4310 Post-traumatic stress disorder, unspecified: Secondary | ICD-10-CM

## 2014-04-02 DIAGNOSIS — I10 Essential (primary) hypertension: Secondary | ICD-10-CM

## 2014-04-02 MED ORDER — CLONAZEPAM 0.5 MG PO TABS: 0.5000 mg | ORAL_TABLET | Freq: Three times a day (TID) | ORAL | Status: DC | PRN

## 2014-04-02 MED ORDER — LISINOPRIL 40 MG PO TABS: 40.0000 mg | ORAL_TABLET | Freq: Every day | ORAL | Status: DC

## 2014-04-02 MED ORDER — SERTRALINE HCL 25 MG PO TABS: 25.0000 mg | ORAL_TABLET | Freq: Every day | ORAL | Status: DC

## 2014-04-02 MED ORDER — CLONAZEPAM 0.5 MG PO TABS: 0.5000 mg | ORAL_TABLET | Freq: Two times a day (BID) | ORAL | Status: DC | PRN
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
## 2014-04-02 NOTE — Patient Instructions (Addendum)
Out of work until at least 9/14.  Email daily to every other day with update.  Start Sertraline 25mg  daily at bedtime.  Start Clonazepam 0.5mg  up to three times daily for anxiety.  We will set up a visit with psychology.  Follow up here in 2 weeks.  Post-traumatic Stress You have post-traumatic stress disorder (PTSD). This condition causes many different symptoms including: emotional outbursts, anxiety, sleeping problems, social withdrawal, and drug abuse. PTSD often follows a particularly traumatic event such as war, or natural disasters like hurricanes, earthquakes, or floods. It can also be seen after personal traumas such as accidents, rape, or the death of someone you love. Symptoms may be delayed for days or even years. Emotional numbing and the inability to feel your emotions, may be the earliest sign. Periods of agitation, aggression, and inability to perform ordinary tasks are common with PTSD. Nightmares and daytime memories of the trauma often bring on uncontrolled symptoms. Sufferers typically startle easily and avoid reminders of the trauma. Panic attacks, feelings of extreme guilt, and blackouts are often reported. Treatment is very helpful, especially group therapy. Healing happens when emotional traumas are shared with others who have a sympathetic ear. The [facility] have helped over 185,000 veterans with this problem. Medication is also very effective. The symptoms can become chronic and lifelong, so it is important to get help. Call your caregiver or a counselor who deals with this type of problem for further assistance. Document Released: 08/27/2004 Document Revised: 10/12/2011 Document Reviewed: 07/20/2005 [facility] Patient Information 2015 [place]. This information is not intended to replace advice given to you by your health care provider. Make sure you discuss any questions you have with your health care provider.
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
## 2014-04-02 NOTE — Assessment & Plan Note (Signed)
BP Readings from Last 3 Encounters:  04/02/14 140/72  01/19/14 106/70  11/15/13 120/84   BP elevated today, however pt tearful describing recent events. Will continue current medications for now. Follow up in 2 weeks.

## 2014-04-02 NOTE — Progress Notes (Signed)
Pre visit review using our clinic review tool, if applicable. No additional management support is needed unless otherwise documented below in the visit note. 

## 2014-04-04 ENCOUNTER — Encounter: Payer: Self-pay | Admitting: Internal Medicine

## 2014-04-06 ENCOUNTER — Other Ambulatory Visit: Payer: Self-pay | Admitting: Internal Medicine

## 2014-04-06 MED ORDER — LORAZEPAM 0.5 MG PO TABS: 0.5000 mg | ORAL_TABLET | Freq: Two times a day (BID) | ORAL | Status: DC | PRN

## 2014-04-12 ENCOUNTER — Ambulatory Visit (INDEPENDENT_AMBULATORY_CARE_PROVIDER_SITE_OTHER): Payer: 59 | Admitting: Internal Medicine

## 2014-04-12 ENCOUNTER — Encounter: Payer: Self-pay | Admitting: Internal Medicine

## 2014-04-12 VITALS — BP 120/82 | HR 98 | Temp 98.2°F | Ht 62.75 in | Wt 187.8 lb

## 2014-04-12 DIAGNOSIS — F4310 Post-traumatic stress disorder, unspecified: Secondary | ICD-10-CM

## 2014-04-12 MED ORDER — ESCITALOPRAM OXALATE 5 MG PO TABS: 5.0000 mg | ORAL_TABLET | Freq: Every day | ORAL | Status: DC

## 2014-04-12 MED ORDER — LORAZEPAM 1 MG PO TABS
1.0000 mg | ORAL_TABLET | Freq: Two times a day (BID) | ORAL | Status: DC | PRN
Start: 2014-04-12 — End: 2014-05-07

## 2014-04-12 NOTE — Progress Notes (Signed)
Pre visit review using our clinic review tool, if applicable. No additional management support is needed unless otherwise documented below in the visit note. 
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
## 2014-04-12 NOTE — Progress Notes (Signed)
   Subjective:    Patient ID: Chelsea Nolan, female    DOB: Jul 25, 1981, 33 y.o.   MRN: [id number]  HPI 33YO female presents to follow up recent diagnosis of PTSD.  No improvement in anxiety with current medications. Scheduled to see Dr. Eliot Ford in [place]. Having some suicidal thoughts with use of Sertaline, so stopped medication. Symptoms improved. Having some panic attacks with shortness of breath. No improvement with current dose Lorazepam.  Review of Systems  Constitutional: Negative for fever, chills, appetite change, fatigue and unexpected weight change.  Eyes: Negative for visual disturbance.  Respiratory: Positive for shortness of breath.   Cardiovascular: Negative for chest pain and leg swelling.  Gastrointestinal: Negative for abdominal pain.  Skin: Negative for color change and rash.  Hematological: Negative for adenopathy. Does not bruise/bleed easily.  Psychiatric/Behavioral: Positive for suicidal ideas, sleep disturbance, dysphoric mood, decreased concentration and agitation. Negative for self-injury. The patient is nervous/anxious.        Objective:    BP 120/82  Pulse 98  Temp(Src) 98.2 F (36.8 C) (Oral)  Ht 5' 2.75" (1.594 m)  Wt 187 lb 12 oz (85.163 kg)  BMI 33.52 kg/m2  SpO2 97%  LMP 04/05/2014 Physical Exam  Constitutional: She is oriented to person, place, and time. She appears well-developed and well-nourished. No distress.  HENT:  Head: Normocephalic and atraumatic.  Right Ear: External ear normal.  Left Ear: External ear normal.  Nose: Nose normal.  Mouth/Throat: Oropharynx is clear and moist. No oropharyngeal exudate.  Eyes: Conjunctivae are normal. Pupils are equal, round, and reactive to light. Right eye exhibits no discharge. Left eye exhibits no discharge. No scleral icterus.  Neck: Normal range of motion. Neck supple. No tracheal deviation present. No thyromegaly present.  Cardiovascular: Normal rate, regular rhythm, normal heart sounds and  intact distal pulses.  Exam reveals no gallop and no friction rub.   No murmur heard. Pulmonary/Chest: Effort normal and breath sounds normal. No accessory muscle usage. Not tachypneic. No respiratory distress. She has no decreased breath sounds. She has no wheezes. She has no rhonchi. She has no rales. She exhibits no tenderness.  Musculoskeletal: Normal range of motion. She exhibits no edema and no tenderness.  Lymphadenopathy:    She has no cervical adenopathy.  Neurological: She is alert and oriented to person, place, and time. No cranial nerve deficit. She exhibits normal muscle tone. Coordination normal.  Skin: Skin is warm and dry. No rash noted. She is not diaphoretic. No erythema. No pallor.  Psychiatric: Her speech is normal and behavior is normal. Judgment and thought content normal. Her mood appears anxious. She exhibits a depressed mood. She expresses no suicidal ideation. She expresses no suicidal plans.          Assessment & Plan:   Problem List Items Addressed This Visit     Unprioritized   PTSD (post-traumatic stress disorder) - Primary     No improvement in symptoms with Sertraline. Will change to Lexapro 5mg  daily. Will increase dose of Lorazepam to 1mg  po bid prn. Encouraged her to follow up with counseling as scheduled. She will contact for safety. Follow up 2 weeks.    Relevant Medications      escitalopram (LEXAPRO) tablet      LORazepam (ATIVAN) tablet       Return in about 2 weeks (around 04/26/2014).

## 2014-04-12 NOTE — Patient Instructions (Addendum)
Start Lexapro 5mg  daily for one week, then increase to 10mg  daily.  Increase Lorazepam to 1mg  up to twice daily as needed for anxiety.  Follow up in 2 weeks.

## 2014-04-16 ENCOUNTER — Encounter: Payer: Self-pay | Admitting: Internal Medicine

## 2014-04-20 ENCOUNTER — Encounter: Payer: Self-pay | Admitting: Internal Medicine

## 2014-04-21 ENCOUNTER — Emergency Department: Payer: Self-pay | Admitting: Emergency Medicine

## 2014-04-22 LAB — CBC WITH DIFFERENTIAL/PLATELET
BASOS ABS: 0.2 10*3/uL — AB (ref 0.0–0.1)
BASOS PCT: 1.2 %
EOS PCT: 3.3 %
Eosinophil #: 0.5 10*3/uL (ref 0.0–0.7)
HCT: 41.1 % (ref 35.0–47.0)
HGB: 13.8 g/dL (ref 12.0–16.0)
Lymphocyte #: 4.5 10*3/uL — ABNORMAL HIGH (ref 1.0–3.6)
Lymphocyte %: 29.8 %
MCH: 32.9 pg (ref 26.0–34.0)
MCHC: 33.5 g/dL (ref 32.0–36.0)
MCV: 98 fL (ref 80–100)
Monocyte #: 1 x10 3/mm — ABNORMAL HIGH (ref 0.2–0.9)
Monocyte %: 6.7 %
NEUTROS ABS: 8.9 10*3/uL — AB (ref 1.4–6.5)
Neutrophil %: 59 %
Platelet: 348 10*3/uL (ref 150–440)
RBC: 4.18 10*6/uL (ref 3.80–5.20)
RDW: 13.5 % (ref 11.5–14.5)
WBC: 15.1 10*3/uL — AB (ref 3.6–11.0)

## 2014-04-22 LAB — COMPREHENSIVE METABOLIC PANEL
Albumin: 3.3 g/dL — ABNORMAL LOW (ref 3.4–5.0)
Alkaline Phosphatase: 92 U/L
Anion Gap: 9 (ref 7–16)
BUN: 10 mg/dL (ref 7–18)
Bilirubin,Total: 0.5 mg/dL (ref 0.2–1.0)
CO2: 25 mmol/L (ref 21–32)
CREATININE: 0.87 mg/dL (ref 0.60–1.30)
Calcium, Total: 8.6 mg/dL (ref 8.5–10.1)
Chloride: 104 mmol/L (ref 98–107)
Glucose: 106 mg/dL — ABNORMAL HIGH (ref 65–99)
OSMOLALITY: 275 (ref 275–301)
POTASSIUM: 3.5 mmol/L (ref 3.5–5.1)
SGOT(AST): 20 U/L (ref 15–37)
SGPT (ALT): 28 U/L
Sodium: 138 mmol/L (ref 136–145)
Total Protein: 7.4 g/dL (ref 6.4–8.2)

## 2014-04-22 LAB — URINALYSIS, COMPLETE
Bacteria: NONE SEEN
Bilirubin,UR: NEGATIVE
GLUCOSE, UR: NEGATIVE mg/dL (ref 0–75)
KETONE: NEGATIVE
LEUKOCYTE ESTERASE: NEGATIVE
Nitrite: NEGATIVE
Ph: 6 (ref 4.5–8.0)
RBC,UR: 4264 /HPF (ref 0–5)
Specific Gravity: 1.018 (ref 1.003–1.030)
Squamous Epithelial: 14
WBC UR: 22 /HPF (ref 0–5)

## 2014-04-23 ENCOUNTER — Ambulatory Visit: Payer: 59 | Admitting: Internal Medicine

## 2014-05-02 ENCOUNTER — Encounter: Payer: Self-pay | Admitting: Internal Medicine

## 2014-05-07 ENCOUNTER — Encounter: Payer: Self-pay | Admitting: Internal Medicine

## 2014-05-07 ENCOUNTER — Emergency Department: Payer: Self-pay | Admitting: Emergency Medicine

## 2014-05-07 ENCOUNTER — Other Ambulatory Visit: Payer: Self-pay | Admitting: *Deleted

## 2014-05-07 DIAGNOSIS — F4310 Post-traumatic stress disorder, unspecified: Secondary | ICD-10-CM

## 2014-05-07 LAB — CBC WITH DIFFERENTIAL/PLATELET
Basophil #: 0.2 10*3/uL — ABNORMAL HIGH (ref 0.0–0.1)
Basophil %: 1.1 %
Eosinophil #: 0.6 10*3/uL (ref 0.0–0.7)
Eosinophil %: 4 %
HCT: 40.7 % (ref 35.0–47.0)
HGB: 13.5 g/dL (ref 12.0–16.0)
Lymphocyte #: 5.6 10*3/uL — ABNORMAL HIGH (ref 1.0–3.6)
Lymphocyte %: 37.5 %
MCH: 32.6 pg (ref 26.0–34.0)
MCHC: 33.2 g/dL (ref 32.0–36.0)
MCV: 98 fL (ref 80–100)
MONO ABS: 1.1 x10 3/mm — AB (ref 0.2–0.9)
MONOS PCT: 7.3 %
NEUTROS PCT: 50.1 %
Neutrophil #: 7.5 10*3/uL — ABNORMAL HIGH (ref 1.4–6.5)
PLATELETS: 354 10*3/uL (ref 150–440)
RBC: 4.14 10*6/uL (ref 3.80–5.20)
RDW: 13.6 % (ref 11.5–14.5)
WBC: 14.9 10*3/uL — AB (ref 3.6–11.0)

## 2014-05-07 LAB — URINALYSIS, COMPLETE
BACTERIA: NONE SEEN
Bilirubin,UR: NEGATIVE
GLUCOSE, UR: NEGATIVE mg/dL (ref 0–75)
KETONE: NEGATIVE
Leukocyte Esterase: NEGATIVE
Nitrite: NEGATIVE
PROTEIN: NEGATIVE
Ph: 6 (ref 4.5–8.0)
RBC,UR: 1100 /HPF (ref 0–5)
Specific Gravity: 1.02 (ref 1.003–1.030)
Squamous Epithelial: 3
WBC UR: 11 /HPF (ref 0–5)

## 2014-05-07 LAB — COMPREHENSIVE METABOLIC PANEL
Albumin: 3.4 g/dL (ref 3.4–5.0)
Alkaline Phosphatase: 96 U/L
Anion Gap: 8 (ref 7–16)
BILIRUBIN TOTAL: 0.3 mg/dL (ref 0.2–1.0)
BUN: 10 mg/dL (ref 7–18)
CHLORIDE: 109 mmol/L — AB (ref 98–107)
Calcium, Total: 8.8 mg/dL (ref 8.5–10.1)
Co2: 25 mmol/L (ref 21–32)
Creatinine: 0.56 mg/dL — ABNORMAL LOW (ref 0.60–1.30)
EGFR (African American): >60
EGFR (Non-African Amer.): >60
Glucose: 94 mg/dL (ref 65–99)
Osmolality: 282 (ref 275–301)
POTASSIUM: 4.5 mmol/L (ref 3.5–5.1)
SGOT(AST): 29 U/L (ref 15–37)
SGPT (ALT): 22 U/L
Sodium: 142 mmol/L (ref 136–145)
Total Protein: 7 g/dL (ref 6.4–8.2)

## 2014-05-07 MED ORDER — LORAZEPAM 1 MG PO TABS
1.0000 mg | ORAL_TABLET | Freq: Two times a day (BID) | ORAL | Status: DC | PRN
Start: 2014-05-07 — End: 2014-05-17

## 2014-05-08 ENCOUNTER — Telehealth: Payer: Self-pay | Admitting: *Deleted

## 2014-05-08 NOTE — Telephone Encounter (Signed)
Fax from [facility], needing PA on Lorazepam. Started online, pending response.

## 2014-05-09 NOTE — Telephone Encounter (Signed)
Called for PA, was told it had already been processed and paid for by operator. No PA needed.

## 2014-05-10 ENCOUNTER — Encounter: Payer: Self-pay | Admitting: Internal Medicine

## 2014-05-10 ENCOUNTER — Ambulatory Visit: Payer: 59 | Admitting: Internal Medicine

## 2014-05-10 DIAGNOSIS — Z0289 Encounter for other administrative examinations: Secondary | ICD-10-CM

## 2014-05-15 ENCOUNTER — Encounter: Payer: Self-pay | Admitting: Internal Medicine

## 2014-05-17 ENCOUNTER — Telehealth: Payer: Self-pay | Admitting: *Deleted

## 2014-05-17 ENCOUNTER — Ambulatory Visit (INDEPENDENT_AMBULATORY_CARE_PROVIDER_SITE_OTHER): Payer: PRIVATE HEALTH INSURANCE | Admitting: Internal Medicine

## 2014-05-17 ENCOUNTER — Encounter: Payer: Self-pay | Admitting: Internal Medicine

## 2014-05-17 VITALS — BP 108/78 | HR 92 | Temp 98.4°F | Wt 191.5 lb

## 2014-05-17 DIAGNOSIS — N200 Calculus of kidney: Secondary | ICD-10-CM

## 2014-05-17 DIAGNOSIS — F4310 Post-traumatic stress disorder, unspecified: Secondary | ICD-10-CM

## 2014-05-17 DIAGNOSIS — Z23 Encounter for immunization: Secondary | ICD-10-CM

## 2014-05-17 LAB — POCT URINALYSIS DIPSTICK
Bilirubin, UA: NEGATIVE
GLUCOSE UA: NEGATIVE
Ketones, UA: NEGATIVE
Leukocytes, UA: NEGATIVE
NITRITE UA: NEGATIVE
Protein, UA: NEGATIVE
UROBILINOGEN UA: 0.2
pH, UA: 5.5

## 2014-05-17 LAB — COMPREHENSIVE METABOLIC PANEL
ALT: 19 U/L (ref 0–35)
AST: 22 U/L (ref 0–37)
Albumin: 3.3 g/dL — ABNORMAL LOW (ref 3.5–5.2)
Alkaline Phosphatase: 86 U/L (ref 39–117)
BILIRUBIN TOTAL: 0.4 mg/dL (ref 0.2–1.2)
BUN: 9 mg/dL (ref 6–23)
CALCIUM: 9.6 mg/dL (ref 8.4–10.5)
CO2: 25 mEq/L (ref 19–32)
Chloride: 104 mEq/L (ref 96–112)
Creatinine, Ser: 0.8 mg/dL (ref 0.4–1.2)
GFR: 89.21 mL/min (ref >60.00)
Glucose, Bld: 63 mg/dL — ABNORMAL LOW (ref 70–99)
Potassium: 4 mEq/L (ref 3.5–5.1)
Sodium: 139 mEq/L (ref 135–145)
Total Protein: 7.3 g/dL (ref 6.0–8.3)

## 2014-05-17 MED ORDER — LORAZEPAM 1 MG PO TABS: 1.0000 mg | ORAL_TABLET | Freq: Four times a day (QID) | ORAL | Status: DC | PRN

## 2014-05-17 MED ORDER — ESCITALOPRAM OXALATE 5 MG PO TABS: 5.0000 mg | ORAL_TABLET | Freq: Every day | ORAL | Status: DC

## 2014-05-17 NOTE — Assessment & Plan Note (Signed)
History of renal stones. Having some intermittent hematuria. Urinalysis pos for blood today. Will request recent US performed at [facility]. Will set up follow up with Urology.

## 2014-05-17 NOTE — Progress Notes (Signed)
Subjective:    Patient ID: Chelsea Nolan, female    DOB: 09-21-1980, 33 y.o.   MRN: [id number]  HPI 33YO female presents for follow up.  Recently treated for PTSD. Has not heard anything from work regarding psychiatric care. Taking Lorazepam 1-2mg  twice daily with no improvement. Never started Lexapro. Counselor from work told her this medication was not safe. Feeling anxious all of the time with frequent crying. Unable to sleep. Has not returned to work.  Also has issues with recurrent kidney stones. Previously diagnosed as having 8.14mm stone on the right.  Would like to re-establish care with urology. No current flank pain. No recent fever, chills.   Review of Systems  Constitutional: Negative for fever, chills, appetite change, fatigue and unexpected weight change.  Eyes: Negative for visual disturbance.  Respiratory: Negative for shortness of breath.   Cardiovascular: Negative for chest pain and leg swelling.  Gastrointestinal: Negative for abdominal pain.  Genitourinary: Positive for hematuria (occasional). Negative for dysuria, urgency, frequency, flank pain and pelvic pain.  Skin: Negative for color change and rash.  Hematological: Negative for adenopathy. Does not bruise/bleed easily.  Psychiatric/Behavioral: Positive for sleep disturbance and dysphoric mood. Negative for suicidal ideas. The patient is nervous/anxious.        Objective:    BP 108/78  Pulse 92  Temp(Src) 98.4 F (36.9 C)  Wt 191 lb 8 oz (86.864 kg)  SpO2 95% Physical Exam  Constitutional: She is oriented to person, place, and time. She appears well-developed and well-nourished. No distress.  HENT:  Head: Normocephalic and atraumatic.  Right Ear: External ear normal.  Left Ear: External ear normal.  Nose: Nose normal.  Mouth/Throat: Oropharynx is clear and moist.  Eyes: Conjunctivae are normal. Pupils are equal, round, and reactive to light. Right eye exhibits no discharge. Left eye exhibits no  discharge. No scleral icterus.  Neck: Normal range of motion. Neck supple. No tracheal deviation present. No thyromegaly present.  Cardiovascular: Normal rate, regular rhythm, normal heart sounds and intact distal pulses.  Exam reveals no gallop and no friction rub.   No murmur heard. Pulmonary/Chest: Effort normal and breath sounds normal. No accessory muscle usage. Not tachypneic. No respiratory distress. She has no decreased breath sounds. She has no wheezes. She has no rhonchi. She has no rales. She exhibits no tenderness.  Abdominal: There is no tenderness (no CVA tenderness).  Musculoskeletal: Normal range of motion. She exhibits no edema and no tenderness.  Lymphadenopathy:    She has no cervical adenopathy.  Neurological: She is alert and oriented to person, place, and time. No cranial nerve deficit. She exhibits normal muscle tone. Coordination normal.  Skin: Skin is warm and dry. No rash noted. She is not diaphoretic. No erythema. No pallor.  Psychiatric: Her speech is normal and behavior is normal. Judgment and thought content normal. Her mood appears anxious. Cognition and memory are normal. She exhibits a depressed mood. She expresses no suicidal ideation.          Assessment & Plan:   Problem List Items Addressed This Visit     Unprioritized   PTSD (post-traumatic stress disorder) - Primary     Encouraged her to start Lexapro, as directed at previous visit. Will increase Lorazepam to 1mg  po up to four times daily for severe anxiety. We will place referral to psychiatry as she has not been set up with psychiatrist through her work's program. Follow up in 2-4 weeks and prn.    Relevant Medications  escitalopram (LEXAPRO) tablet      LORazepam (ATIVAN) tablet   Other Relevant Orders      Ambulatory referral to Psychiatry      CULTURE, URINE COMPREHENSIVE   Renal stones     History of renal stones. Having some intermittent hematuria. Urinalysis pos for blood today. Will  request recent US performed at [facility]. Will set up follow up with Urology.    Relevant Orders      Ambulatory referral to Urology      Comprehensive metabolic panel      POCT Urinalysis Dipstick (Completed)      CULTURE, URINE COMPREHENSIVE    Other Visit Diagnoses   Encounter for immunization            Return in about 4 weeks (around 06/14/2014) for Recheck.

## 2014-05-17 NOTE — Assessment & Plan Note (Signed)
Encouraged her to start Lexapro, as directed at previous visit. Will increase Lorazepam to 1mg  po up to four times daily for severe anxiety. We will place referral to psychiatry as she has not been set up with psychiatrist through her work's program. Follow up in 2-4 weeks and prn.

## 2014-05-17 NOTE — Progress Notes (Signed)
Pre visit review using our clinic review tool, if applicable. No additional management support is needed unless otherwise documented below in the visit note. 

## 2014-05-17 NOTE — Telephone Encounter (Signed)
Pt wanted me to let you know that when her urology appt is scheduled she wants it to be with Dr Viprakasit @ [facility]

## 2014-05-17 NOTE — Patient Instructions (Addendum)
Start Lexapro 5mg  daily.  Continue Lorazepam 1mg  up to every 6 hours as needed for severe anxiety.  We will set up evaluation with psychiatry and urology.

## 2014-05-19 LAB — CULTURE, URINE COMPREHENSIVE
Colony Count: NO GROWTH
Organism ID, Bacteria: NO GROWTH

## 2014-05-24 ENCOUNTER — Encounter: Payer: Self-pay | Admitting: Internal Medicine

## 2014-05-24 ENCOUNTER — Other Ambulatory Visit: Payer: Self-pay | Admitting: Internal Medicine

## 2014-05-24 MED ORDER — BUTALBITAL-APAP-CAFFEINE 50-325-40 MG PO TABS: 1.0000 | ORAL_TABLET | Freq: Two times a day (BID) | ORAL | Status: DC | PRN

## 2014-05-30 ENCOUNTER — Encounter: Payer: Self-pay | Admitting: Internal Medicine

## 2014-08-04 ENCOUNTER — Emergency Department: Payer: Self-pay | Admitting: Emergency Medicine

## 2014-08-14 ENCOUNTER — Other Ambulatory Visit: Payer: Self-pay | Admitting: Internal Medicine

## 2014-08-14 ENCOUNTER — Encounter: Payer: Self-pay | Admitting: Internal Medicine

## 2014-08-14 NOTE — Telephone Encounter (Signed)
Last visit 05/17/14, refill?

## 2014-08-15 ENCOUNTER — Other Ambulatory Visit: Payer: Self-pay | Admitting: Internal Medicine

## 2014-08-15 NOTE — Telephone Encounter (Signed)
Called to pharmacy 

## 2014-08-15 NOTE — Telephone Encounter (Signed)
Last OV 10.15.15, last refill 12.18.15.  Please advise refill.

## 2014-09-02 ENCOUNTER — Emergency Department: Payer: Self-pay | Admitting: Emergency Medicine

## 2014-09-02 LAB — URINALYSIS, COMPLETE
BILIRUBIN, UR: NEGATIVE
Glucose,UR: NEGATIVE mg/dL (ref 0–75)
Ketone: NEGATIVE
LEUKOCYTE ESTERASE: NEGATIVE
Nitrite: NEGATIVE
PH: 6 (ref 4.5–8.0)
Protein: 30
RBC,UR: 1456 /HPF (ref 0–5)
Specific Gravity: 1.021 (ref 1.003–1.030)
WBC UR: 9 /HPF (ref 0–5)

## 2014-09-02 LAB — CBC WITH DIFFERENTIAL/PLATELET
BASOS ABS: 0.2 10*3/uL — AB (ref 0.0–0.1)
Basophil %: 1.1 %
Eosinophil #: 0.5 10*3/uL (ref 0.0–0.7)
Eosinophil %: 3.5 %
HCT: 43.1 % (ref 35.0–47.0)
HGB: 14.4 g/dL (ref 12.0–16.0)
LYMPHS PCT: 34.6 %
Lymphocyte #: 5.4 10*3/uL — ABNORMAL HIGH (ref 1.0–3.6)
MCH: 31.8 pg (ref 26.0–34.0)
MCHC: 33.4 g/dL (ref 32.0–36.0)
MCV: 95 fL (ref 80–100)
MONO ABS: 1.3 x10 3/mm — AB (ref 0.2–0.9)
MONOS PCT: 8.3 %
Neutrophil #: 8.3 10*3/uL — ABNORMAL HIGH (ref 1.4–6.5)
Neutrophil %: 52.5 %
PLATELETS: 334 10*3/uL (ref 150–440)
RBC: 4.51 10*6/uL (ref 3.80–5.20)
RDW: 14.1 % (ref 11.5–14.5)
WBC: 15.7 10*3/uL — ABNORMAL HIGH (ref 3.6–11.0)

## 2014-09-02 LAB — COMPREHENSIVE METABOLIC PANEL
ANION GAP: 5 — AB (ref 7–16)
Albumin: 3.4 g/dL (ref 3.4–5.0)
Alkaline Phosphatase: 112 U/L (ref 46–116)
BUN: 9 mg/dL (ref 7–18)
Bilirubin,Total: 0.2 mg/dL (ref 0.2–1.0)
CALCIUM: 8.9 mg/dL (ref 8.5–10.1)
CHLORIDE: 108 mmol/L — AB (ref 98–107)
CO2: 28 mmol/L (ref 21–32)
Creatinine: 0.68 mg/dL (ref 0.60–1.30)
EGFR (African American): >60
EGFR (Non-African Amer.): >60
Glucose: 100 mg/dL — ABNORMAL HIGH (ref 65–99)
OSMOLALITY: 280 (ref 275–301)
POTASSIUM: 3.8 mmol/L (ref 3.5–5.1)
SGOT(AST): 26 U/L (ref 15–37)
SGPT (ALT): 35 U/L (ref 14–63)
SODIUM: 141 mmol/L (ref 136–145)
Total Protein: 7.3 g/dL (ref 6.4–8.2)

## 2014-09-13 ENCOUNTER — Emergency Department: Payer: Self-pay | Admitting: Emergency Medicine

## 2014-11-15 ENCOUNTER — Telehealth: Payer: Self-pay | Admitting: Internal Medicine

## 2014-11-15 NOTE — Telephone Encounter (Signed)
Spoke with pt, scheduled appoint [phone] at 7:30 am

## 2014-11-15 NOTE — Telephone Encounter (Signed)
Left message on VM for pt to return call.

## 2014-11-15 NOTE — Telephone Encounter (Signed)
Last Ov 10.15.15.  Last refill 1.13.16.  Advise refill

## 2014-11-15 NOTE — Telephone Encounter (Signed)
Needs to be seen 30min 

## 2014-11-21 ENCOUNTER — Emergency Department
Admit: 2014-11-21 | Disposition: A | Discharge status: Home / Self Care | Payer: Self-pay | Admitting: Emergency Medicine

## 2014-12-10 ENCOUNTER — Encounter: Payer: Self-pay | Admitting: Internal Medicine

## 2014-12-10 ENCOUNTER — Ambulatory Visit (INDEPENDENT_AMBULATORY_CARE_PROVIDER_SITE_OTHER): Payer: Self-pay | Admitting: Internal Medicine

## 2014-12-10 VITALS — BP 133/85 | HR 96 | Temp 97.6°F | Ht 62.75 in | Wt 198.5 lb

## 2014-12-10 DIAGNOSIS — F4310 Post-traumatic stress disorder, unspecified: Secondary | ICD-10-CM

## 2014-12-10 DIAGNOSIS — L4050 Arthropathic psoriasis, unspecified: Secondary | ICD-10-CM

## 2014-12-10 DIAGNOSIS — R5382 Chronic fatigue, unspecified: Secondary | ICD-10-CM

## 2014-12-10 DIAGNOSIS — N924 Excessive bleeding in the premenopausal period: Secondary | ICD-10-CM

## 2014-12-10 LAB — CBC WITH DIFFERENTIAL/PLATELET
Basophils Absolute: 0.1 10*3/uL (ref 0.0–0.1)
Basophils Relative: 0.5 % (ref 0.0–3.0)
Eosinophils Absolute: 0.5 10*3/uL (ref 0.0–0.7)
Eosinophils Relative: 4.2 % (ref 0.0–5.0)
HCT: 41 % (ref 36.0–46.0)
Hemoglobin: 13.9 g/dL (ref 12.0–15.0)
LYMPHS ABS: 3.9 10*3/uL (ref 0.7–4.0)
Lymphocytes Relative: 31.8 % (ref 12.0–46.0)
MCHC: 34 g/dL (ref 30.0–36.0)
MCV: 94.6 fl (ref 78.0–100.0)
Monocytes Absolute: 0.9 10*3/uL (ref 0.1–1.0)
Monocytes Relative: 7.2 % (ref 3.0–12.0)
NEUTROS ABS: 6.9 10*3/uL (ref 1.4–7.7)
Neutrophils Relative %: 56.3 % (ref 43.0–77.0)
PLATELETS: 383 10*3/uL (ref 150.0–400.0)
RBC: 4.34 Mil/uL (ref 3.87–5.11)
RDW: 14.4 % (ref 11.5–15.5)
WBC: 12.2 10*3/uL — ABNORMAL HIGH (ref 4.0–10.5)

## 2014-12-10 LAB — COMPREHENSIVE METABOLIC PANEL
ALBUMIN: 3.9 g/dL (ref 3.5–5.2)
ALT: 19 U/L (ref 0–35)
AST: 19 U/L (ref 0–37)
Alkaline Phosphatase: 88 U/L (ref 39–117)
BUN: 14 mg/dL (ref 6–23)
CALCIUM: 9.1 mg/dL (ref 8.4–10.5)
CO2: 22 mEq/L (ref 19–32)
Chloride: 104 mEq/L (ref 96–112)
Creatinine, Ser: 0.68 mg/dL (ref 0.40–1.20)
GFR: 105.7 mL/min (ref >60.00)
GLUCOSE: 117 mg/dL — AB (ref 70–99)
POTASSIUM: 3.3 meq/L — AB (ref 3.5–5.1)
SODIUM: 136 meq/L (ref 135–145)
TOTAL PROTEIN: 7.4 g/dL (ref 6.0–8.3)
Total Bilirubin: 0.6 mg/dL (ref 0.2–1.2)

## 2014-12-10 LAB — VITAMIN B12: VITAMIN B 12: 133 pg/mL — AB (ref 211–911)

## 2014-12-10 LAB — VITAMIN D 25 HYDROXY (VIT D DEFICIENCY, FRACTURES): VITD: 18.76 ng/mL — ABNORMAL LOW (ref 30.00–100.00)

## 2014-12-10 LAB — TSH: TSH: 2.3 u[IU]/mL (ref 0.35–4.50)

## 2014-12-10 MED ORDER — BUTALBITAL-APAP-CAFFEINE 50-325-40 MG PO TABS: ORAL_TABLET | ORAL | Status: DC

## 2014-12-10 NOTE — Progress Notes (Signed)
Subjective:    Patient ID: Chelsea Nolan, female    DOB: 1981-04-07, 34 y.o.   MRN: [id number]  HPI  34YO female presents for follow up.  Headaches - started on Topamax, but developed tingling in upper trunk and face. Stopped medication. Also tried Buspar to help with anxiety, but this caused increased anxiety. Started Prazosin to help with sleep. Had some improvement with this. However, feels groggy in the morning. Feels tired during the day.  Rheumatologist has left [facility]. Needs a new provider. Having diffuse joint pain and aching pain in shoulders. Also had an injury with her dog and fell down steps.  Menorrhagia - Soaking 3 boxes of tampons per cycle. Talked with OB in past about hysterectomy. Would like to meet with them again.   Past medical, surgical, family and social history per today's encounter.  Review of Systems  Constitutional: Negative for fever, chills, appetite change, fatigue and unexpected weight change.  Eyes: Negative for visual disturbance.  Respiratory: Negative for shortness of breath.   Cardiovascular: Negative for chest pain and leg swelling.  Gastrointestinal: Negative for nausea, vomiting, abdominal pain, diarrhea and constipation.  Genitourinary: Positive for menstrual problem. Negative for vaginal bleeding and vaginal pain.  Skin: Negative for color change and rash.  Hematological: Negative for adenopathy. Does not bruise/bleed easily.  Psychiatric/Behavioral: Negative for sleep disturbance and dysphoric mood. The patient is nervous/anxious.        Objective:    BP 133/85 mmHg  Pulse 96  Temp(Src) 97.6 F (36.4 C) (Oral)  Ht 5' 2.75" (1.594 m)  Wt 198 lb 8 oz (90.039 kg)  BMI 35.44 kg/m2  SpO2 99%  LMP 11/21/2014 Physical Exam  Constitutional: She is oriented to person, place, and time. She appears well-developed and well-nourished. No distress.  HENT:  Head: Normocephalic and atraumatic.  Right Ear: External ear normal.  Left Ear: External  ear normal.  Nose: Nose normal.  Mouth/Throat: Oropharynx is clear and moist. No oropharyngeal exudate.  Eyes: Conjunctivae are normal. Pupils are equal, round, and reactive to light. Right eye exhibits no discharge. Left eye exhibits no discharge. No scleral icterus.  Neck: Normal range of motion. Neck supple. No tracheal deviation present. No thyromegaly present.  Cardiovascular: Normal rate, regular rhythm, normal heart sounds and intact distal pulses.  Exam reveals no gallop and no friction rub.   No murmur heard. Pulmonary/Chest: Effort normal and breath sounds normal. No respiratory distress. She has no wheezes. She has no rales. She exhibits no tenderness.  Musculoskeletal: Normal range of motion. She exhibits no edema or tenderness.  Lymphadenopathy:    She has no cervical adenopathy.  Neurological: She is alert and oriented to person, place, and time. No cranial nerve deficit. She exhibits normal muscle tone. Coordination normal.  Skin: Skin is warm and dry. No rash noted. She is not diaphoretic. No erythema. No pallor.  Psychiatric: Her speech is normal and behavior is normal. Judgment and thought content normal. Her mood appears anxious. Cognition and memory are normal.          Assessment & Plan:   Problem List Items Addressed This Visit      Unprioritized   Menorrhagia    Longstanding menorrhagia. Discussed potential options including IUD, hysterectomy, Novasure. Will set up GYN evaluation.      Relevant Orders   Ambulatory referral to Gynecology   Psoriatic arthritis    Pt will look into new referral for [facility], as her rheumatologist is leaving. Continue Methotrexate.  PTSD (post-traumatic stress disorder) - Primary    Reviewed notes form [facility]. Continue Lexapro and Prazosin with prn Clonazepam.       Other Visit Diagnoses    Chronic fatigue        Relevant Orders    CBC with Differential/Platelet    Comprehensive metabolic panel    Vit D  25 hydroxy (rtn  osteoporosis monitoring)    TSH    B12        Return in about 3 months (around 03/12/2015) for Recheck.

## 2014-12-10 NOTE — Progress Notes (Signed)
Pre visit review using our clinic review tool, if applicable. No additional management support is needed unless otherwise documented below in the visit note. 

## 2014-12-10 NOTE — Patient Instructions (Signed)
Labs today.   Follow up in 3 months.  

## 2014-12-10 NOTE — Assessment & Plan Note (Addendum)
Reviewed notes form [facility]. Continue Lexapro and Prazosin with prn Clonazepam.

## 2014-12-10 NOTE — Assessment & Plan Note (Signed)
Pt will look into new referral for [facility], as her rheumatologist is leaving. Continue Methotrexate.

## 2014-12-10 NOTE — Assessment & Plan Note (Addendum)
Longstanding menorrhagia. Discussed potential options including IUD, hysterectomy, Novasure. Will set up GYN evaluation.

## 2014-12-18 ENCOUNTER — Ambulatory Visit (INDEPENDENT_AMBULATORY_CARE_PROVIDER_SITE_OTHER): Payer: Self-pay | Admitting: *Deleted

## 2014-12-18 DIAGNOSIS — E538 Deficiency of other specified B group vitamins: Secondary | ICD-10-CM

## 2014-12-18 MED ORDER — CYANOCOBALAMIN 1000 MCG/ML IJ SOLN
1000.0000 ug | Freq: Once | INTRAMUSCULAR | Status: AC
  Administered 2014-12-18: 1000 ug via INTRAMUSCULAR

## 2014-12-25 ENCOUNTER — Ambulatory Visit (INDEPENDENT_AMBULATORY_CARE_PROVIDER_SITE_OTHER): Payer: Self-pay | Admitting: *Deleted

## 2014-12-25 DIAGNOSIS — E538 Deficiency of other specified B group vitamins: Secondary | ICD-10-CM

## 2014-12-25 MED ORDER — CYANOCOBALAMIN 1000 MCG/ML IJ SOLN
1000.0000 ug | Freq: Once | INTRAMUSCULAR | Status: AC
  Administered 2014-12-25: 1000 ug via INTRAMUSCULAR

## 2014-12-30 DIAGNOSIS — Z720 Tobacco use: Secondary | ICD-10-CM | POA: Insufficient documentation

## 2014-12-30 DIAGNOSIS — I10 Essential (primary) hypertension: Secondary | ICD-10-CM | POA: Insufficient documentation

## 2014-12-30 DIAGNOSIS — N12 Tubulo-interstitial nephritis, not specified as acute or chronic: Secondary | ICD-10-CM | POA: Insufficient documentation

## 2014-12-30 DIAGNOSIS — Z880 Allergy status to penicillin: Secondary | ICD-10-CM | POA: Insufficient documentation

## 2014-12-30 DIAGNOSIS — Z79899 Other long term (current) drug therapy: Secondary | ICD-10-CM | POA: Insufficient documentation

## 2014-12-30 DIAGNOSIS — N390 Urinary tract infection, site not specified: Secondary | ICD-10-CM | POA: Insufficient documentation

## 2014-12-30 NOTE — ED Notes (Signed)
Pt presents to ER alert and in NAD. Pt states she is having "kidney" pain. Pt reports hematuria.

## 2014-12-31 ENCOUNTER — Emergency Department: Payer: Self-pay

## 2014-12-31 ENCOUNTER — Emergency Department
Admission: EM | Admit: 2014-12-31 | Discharge: 2014-12-31 | Disposition: A | Discharge status: Home / Self Care | Payer: Self-pay | Attending: Emergency Medicine | Admitting: Emergency Medicine

## 2014-12-31 DIAGNOSIS — R52 Pain, unspecified: Secondary | ICD-10-CM

## 2014-12-31 DIAGNOSIS — N12 Tubulo-interstitial nephritis, not specified as acute or chronic: Secondary | ICD-10-CM

## 2014-12-31 DIAGNOSIS — N390 Urinary tract infection, site not specified: Secondary | ICD-10-CM

## 2014-12-31 LAB — URINALYSIS COMPLETE WITH MICROSCOPIC (ARMC ONLY)
Bilirubin Urine: NEGATIVE
GLUCOSE, UA: NEGATIVE mg/dL
Ketones, ur: NEGATIVE mg/dL
LEUKOCYTES UA: NEGATIVE
NITRITE: NEGATIVE
PH: 6 (ref 5.0–8.0)
PROTEIN: 100 mg/dL — AB
SPECIFIC GRAVITY, URINE: 1.029 (ref 1.005–1.030)

## 2014-12-31 LAB — COMPREHENSIVE METABOLIC PANEL
ALT: 24 U/L (ref 14–54)
AST: 24 U/L (ref 15–41)
Albumin: 3.7 g/dL (ref 3.5–5.0)
Alkaline Phosphatase: 75 U/L (ref 38–126)
Anion gap: 8 (ref 5–15)
BILIRUBIN TOTAL: 0.4 mg/dL (ref 0.3–1.2)
BUN: 13 mg/dL (ref 6–20)
CALCIUM: 8.7 mg/dL — AB (ref 8.9–10.3)
CO2: 25 mmol/L (ref 22–32)
Chloride: 106 mmol/L (ref 101–111)
Creatinine, Ser: 0.85 mg/dL (ref 0.44–1.00)
GFR calc non Af Amer: >60 mL/min (ref >60)
GLUCOSE: 120 mg/dL — AB (ref 65–99)
POTASSIUM: 3.5 mmol/L (ref 3.5–5.1)
SODIUM: 139 mmol/L (ref 135–145)
Total Protein: 7.4 g/dL (ref 6.5–8.1)

## 2014-12-31 LAB — CBC
HCT: 41.8 % (ref 35.0–47.0)
HEMOGLOBIN: 14 g/dL (ref 12.0–16.0)
MCH: 32.3 pg (ref 26.0–34.0)
MCHC: 33.4 g/dL (ref 32.0–36.0)
MCV: 96.6 fL (ref 80.0–100.0)
Platelets: 308 10*3/uL (ref 150–440)
RBC: 4.33 MIL/uL (ref 3.80–5.20)
RDW: 14.1 % (ref 11.5–14.5)
WBC: 12.5 10*3/uL — AB (ref 3.6–11.0)

## 2014-12-31 LAB — PREGNANCY, URINE: PREG TEST UR: NEGATIVE

## 2014-12-31 MED ORDER — METOCLOPRAMIDE HCL 5 MG/ML IJ SOLN
INTRAMUSCULAR | Status: AC
  Administered 2014-12-31: 10 mg via INTRAVENOUS
  Filled 2014-12-31: qty 2

## 2014-12-31 MED ORDER — METOCLOPRAMIDE HCL 5 MG PO TABS: 5.0000 mg | ORAL_TABLET | Freq: Three times a day (TID) | ORAL | Status: DC

## 2014-12-31 MED ORDER — METOCLOPRAMIDE HCL 5 MG/ML IJ SOLN
10.0000 mg | Freq: Once | INTRAMUSCULAR | Status: AC
  Administered 2014-12-31: 10 mg via INTRAVENOUS

## 2014-12-31 MED ORDER — OXYCODONE-ACETAMINOPHEN 5-325 MG PO TABS
1.0000 | ORAL_TABLET | Freq: Once | ORAL | Status: AC
  Administered 2014-12-31: 1 via ORAL

## 2014-12-31 MED ORDER — HYDROMORPHONE HCL 1 MG/ML IJ SOLN
1.0000 mg | Freq: Once | INTRAMUSCULAR | Status: AC
  Administered 2014-12-31: 1 mg via INTRAVENOUS

## 2014-12-31 MED ORDER — CIPROFLOXACIN IN D5W 400 MG/200ML IV SOLN
INTRAVENOUS | Status: AC
Start: 2014-12-31 — End: 2014-12-31
  Administered 2014-12-31: 400 mg via INTRAVENOUS
  Filled 2014-12-31: qty 200

## 2014-12-31 MED ORDER — OXYCODONE-ACETAMINOPHEN 5-325 MG PO TABS
ORAL_TABLET | ORAL | Status: AC
  Administered 2014-12-31: 1 via ORAL
  Filled 2014-12-31: qty 1

## 2014-12-31 MED ORDER — HYDROMORPHONE HCL 1 MG/ML IJ SOLN
INTRAMUSCULAR | Status: AC
  Administered 2014-12-31: 1 mg via INTRAVENOUS
  Filled 2014-12-31: qty 1

## 2014-12-31 MED ORDER — OXYCODONE-ACETAMINOPHEN 5-325 MG PO TABS
ORAL_TABLET | ORAL | Status: AC
  Filled 2014-12-31: qty 1

## 2014-12-31 MED ORDER — CIPROFLOXACIN IN D5W 400 MG/200ML IV SOLN
400.0000 mg | Freq: Once | INTRAVENOUS | Status: AC
  Administered 2014-12-31: 400 mg via INTRAVENOUS

## 2014-12-31 MED ORDER — OXYCODONE-ACETAMINOPHEN 5-325 MG PO TABS: 1.0000 | ORAL_TABLET | ORAL | Status: DC | PRN

## 2014-12-31 MED ORDER — CIPROFLOXACIN HCL 500 MG PO TABS: 500.0000 mg | ORAL_TABLET | Freq: Two times a day (BID) | ORAL | Status: AC

## 2014-12-31 MED ORDER — SODIUM CHLORIDE 0.9 % IV BOLUS (SEPSIS)
1000.0000 mL | Freq: Once | INTRAVENOUS | Status: AC
  Administered 2014-12-31: 1000 mL via INTRAVENOUS

## 2014-12-31 NOTE — ED Provider Notes (Signed)
[facility] Emergency Department Provider Note  ____________________________________________  Time seen: 720  I have reviewed the triage vital signs and the nursing notes.   HISTORY  Chief Complaint Flank Pain     HPI Chelsea Nolan is a 34 y.o. female with a history of renal stones. She had lithotripsy this past October. She has now bilateral flank pain that hasn't present for a few days but got worse last night. She came to the emergency department at approximately 11:30. She has had a prolonged wait in the waiting room.  She complains of ongoing pain currently. She has nausea with some vomiting. She denies fever.  She does report she passed a "large stone" at home into the toilet.   Past Medical History  Diagnosis Date  . Arthritis   . Hypertension   . Psoriatic arthritis     Dermatologist - Dr. Adolphus Birchwood, Rheumatologist - Dr. Gavin Potters  . Obesity     Patient Active Problem List   Diagnosis Date Noted  . Renal stones 05/17/2014  . PTSD (post-traumatic stress disorder) 04/02/2014  . Menorrhagia 11/15/2013  . Family history of ovarian cancer 11/15/2013  . Muscle spasm 06/12/2013  . Tobacco abuse 03/08/2013  . Psoriatic arthritis 01/26/2013  . Essential hypertension, benign 01/26/2013  . Obesity, unspecified 01/26/2013    Past Surgical History  Procedure Laterality Date  . Vaginal delivery    . Cesarean section      pre-eclampsia    Current Outpatient Rx  Name  Route  Sig  Dispense  Refill  . butalbital-acetaminophen-caffeine (FIORICET, ESGIC) 50-325-40 MG per tablet      TAKE ONE TABLET BY MOUTH TWICE DAILY AS NEEDED FOR HEADACHE Patient taking differently: Take 1 tablet by mouth 2 (two) times daily as needed for headache.    60 tablet   1   . Cholecalciferol (VITAMIN D) 2000 UNITS CAPS   Oral   Take 1 capsule by mouth daily.          . clonazePAM (KLONOPIN) 0.5 MG tablet   Oral   Take 0.25-1 mg by mouth 2 (two) times daily. Pt  takes 1/2 to 1 tablet by mouth in the morning and 2 tablets at night.         . cyanocobalamin (,VITAMIN B-12,) 1000 MCG/ML injection   Subcutaneous   Inject 1,000 mcg into the skin once a week.         . loratadine (CLARITIN) 10 MG tablet   Oral   Take 10 mg by mouth daily as needed for allergies.          . prazosin (MINIPRESS) 2 MG capsule   Oral   Take 2-4 mg by mouth at bedtime. Pt takes 1 to 2 capsules by mouth at bedtime.         . ciprofloxacin (CIPRO) 500 MG tablet   Oral   Take 1 tablet (500 mg total) by mouth 2 (two) times daily.   14 tablet   0   . escitalopram (LEXAPRO) 5 MG tablet   Oral   Take 1 tablet (5 mg total) by mouth daily. Patient not taking: Reported on 12/31/2014   30 tablet   6   . lisinopril (PRINIVIL,ZESTRIL) 40 MG tablet   Oral   Take 1 tablet (40 mg total) by mouth daily. Patient not taking: Reported on 12/31/2014   90 tablet   3   . metoCLOPramide (REGLAN) 5 MG tablet   Oral   Take 1 tablet (  5 mg total) by mouth 3 (three) times daily.   12 tablet   1   . oxyCODONE-acetaminophen (ROXICET) 5-325 MG per tablet   Oral   Take 1 tablet by mouth every 4 (four) hours as needed for severe pain.   12 tablet   0     Allergies Penicillins; Enbrel; Prednisone; Sertraline; Topamax; Amoxicillin; and Zofran  Family History  Problem Relation Age of Onset  . Diabetes Mother   . Hyperlipidemia Mother   . Hypertension Mother   . Arthritis Mother   . Lupus Mother   . Obesity Sister   . Cancer Maternal Grandmother     ovarian    Social History History  Substance Use Topics  . Smoking status: Current Every Day Smoker -- 0.50 packs/day for 15 years    Types: Cigarettes  . Smokeless tobacco: Never Used  . Alcohol Use: No    Review of Systems  Constitutional: Negative for fever. ENT: Negative for sore throat. Cardiovascular: Negative for chest pain. Respiratory: Negative for shortness of breath. Gastrointestinal: Positive for  lower abdominal discomfort and nausea and vomiting. Genitourinary: History of renal stones. Patient reports she passed the stone at home. Flank pain. See history of present illness Musculoskeletal: Negative for back pain. Skin: Negative for rash. Neurological: Negative for headaches   10-point ROS otherwise negative.  ____________________________________________   PHYSICAL EXAM:  VITAL SIGNS: ED Triage Vitals  Enc Vitals Group     BP 12/30/14 2337 143/100 mmHg     Pulse Rate 12/30/14 2337 100     Resp 12/30/14 2337 20     Temp 12/30/14 2337 98.4 F (36.9 C)     Temp Source 12/30/14 2337 Oral     SpO2 12/30/14 2337 99 %     Weight 12/30/14 2337 192 lb (87.091 kg)     Height 12/30/14 2337 5\' 2"  (1.575 m)     Head Cir --      Peak Flow --      Pain Score 12/30/14 2337 8     Pain Loc --      Pain Edu? --      Excl. in GC? --     Constitutional:  Alert and oriented. In discomfort. When I first saw her she was standing over the toilet due to nausea. She appears uncomfortable. ENT   Head: Normocephalic and atraumatic.   Nose: No congestion/rhinnorhea.   Mouth/Throat: Mucous membranes are moist. Cardiovascular: Normal rate, regular rhythm. Respiratory: Normal respiratory effort without tachypnea. Breath sounds are clear and equal bilaterally. No wheezes/rales/rhonchi. Gastrointestinal: Mild discomfort through her lower belly..  Back: Tenderness on both sides. Positive CVA tenderness Musculoskeletal: Nontender with normal range of motion in all extremities.  No noted edema. Neurologic:  Normal speech and language. No gross focal neurologic deficits are appreciated.  Skin:  Skin is warm, dry. No rash noted. Psychiatric: Mood and affect are normal. Speech and behavior are normal.  ____________________________________________    LABS (pertinent positives/negatives)  Metabolic panel is overall within normal limits. Glucose is 120. Renal function is good. CBC shows a  white blood cell count of 12.5. Normal hemoglobin of 14. Urinalysis shows red blood cells and white blood cells too numerous to count. The appearance is cloudy. The color is red.  RADIOLOGY  CT scan IMPRESSION: 1. No renal stones or obstructive uropathy. The previous punctate stone in the upper right kidney is no longer seen. 2. No acute abnormality in the abdomen/pelvis. 3. Small hiatal hernia.  ____________________________________________  ____________________________________________   INITIAL IMPRESSION / ASSESSMENT AND PLAN / ED COURSE  The patient does not appear to have a current obstruction or stone. She does have a urinary tract infection with suspected pyelonephritis. This patient is understandably frustrated after a 8 hour wait to be seen. She is uncomfortable and nauseous. I will treat her with IV antibiotics, pain medicine, and antinausea medicine plus a liter of fluid area.  ----------------------------------------- 8:43 AM on 12/31/2014 -----------------------------------------  Patient feels better. She has no nausea. Her pain is incontinent half. She still has some discomfort and we'll treat her with one Percocet now. We have reviewed the CT and urinalysis results as well as her white blood cell count. The patient says her white blood cell count is always high. We will discharge her soon with prescriptions for Percocet, Reglan, and Cipro.  ----------------------------------------- 10:40 AM on 12/31/2014 -----------------------------------------  Prior to discharge the patient felt much better. She looked better. I apologized again for her long wait.  ____________________________________________   FINAL CLINICAL IMPRESSION(S) / ED DIAGNOSES  Final diagnoses:  UTI (lower urinary tract infection)  Pyelonephritis      Darien Ramus, MD 12/31/14 1105

## 2014-12-31 NOTE — Discharge Instructions (Signed)
Take Cipro for your urinary tract infection. Use Percocet as needed for pain. Take Reglan, 1-2 tablets as needed for nausea. Follow-up with your regular doctor. Return to the emergency department if you feel worse, if your having more pain or other urgent concerns.  Pyelonephritis, Adult Pyelonephritis is a kidney infection. In general, there are 2 main types of pyelonephritis:  Infections that come on quickly without any warning (acute pyelonephritis).  Infections that persist for a long period of time (chronic pyelonephritis). CAUSES  Two main causes of pyelonephritis are:  Bacteria traveling from the bladder to the kidney. This is a problem especially in pregnant women. The urine in the bladder can become filled with bacteria from multiple causes, including:  Inflammation of the prostate gland (prostatitis).  Sexual intercourse in females.  Bladder infection (cystitis).  Bacteria traveling from the bloodstream to the tissue part of the kidney. Problems that may increase your risk of getting a kidney infection include:  Diabetes.  Kidney stones or bladder stones.  Cancer.  Catheters placed in the bladder.  Other abnormalities of the kidney or ureter. SYMPTOMS   Abdominal pain.  Pain in the side or flank area.  Fever.  Chills.  Upset stomach.  Blood in the urine (dark urine).  Frequent urination.  Strong or persistent urge to urinate.  Burning or stinging when urinating. DIAGNOSIS  Your caregiver may diagnose your kidney infection based on your symptoms. A urine sample may also be taken. TREATMENT  In general, treatment depends on how severe the infection is.   If the infection is mild and caught early, your caregiver may treat you with oral antibiotics and send you home.  If the infection is more severe, the bacteria may have gotten into the bloodstream. This will require intravenous (IV) antibiotics and a hospital stay. Symptoms may include:  High  fever.  Severe flank pain.  Shaking chills.  Even after a hospital stay, your caregiver may require you to be on oral antibiotics for a period of time.  Other treatments may be required depending upon the cause of the infection. HOME CARE INSTRUCTIONS   Take your antibiotics as directed. Finish them even if you start to feel better.  Make an appointment to have your urine checked to make sure the infection is gone.  Drink enough fluids to keep your urine clear or pale yellow.  Take medicines for the bladder if you have urgency and frequency of urination as directed by your caregiver. SEEK IMMEDIATE MEDICAL CARE IF:   You have a fever or persistent symptoms for more than 2-3 days.  You have a fever and your symptoms suddenly get worse.  You are unable to take your antibiotics or fluids.  You develop shaking chills.  You experience extreme weakness or fainting.  There is no improvement after 2 days of treatment. MAKE SURE YOU:  Understand these instructions.  Will watch your condition.  Will get help right away if you are not doing well or get worse. Document Released: 07/20/2005 Document Revised: 01/19/2012 Document Reviewed: 12/24/2010 [facility] Patient Information 2015 [place], [place]. This information is not intended to replace advice given to you by your health care provider. Make sure you discuss any questions you have with your health care provider.

## 2015-01-01 ENCOUNTER — Ambulatory Visit (INDEPENDENT_AMBULATORY_CARE_PROVIDER_SITE_OTHER): Payer: Self-pay | Admitting: *Deleted

## 2015-01-01 DIAGNOSIS — E538 Deficiency of other specified B group vitamins: Secondary | ICD-10-CM

## 2015-01-01 LAB — URINE CULTURE

## 2015-01-01 MED ORDER — CYANOCOBALAMIN 1000 MCG/ML IJ SOLN
1000.0000 ug | Freq: Once | INTRAMUSCULAR | Status: AC
  Administered 2015-01-01: 1000 ug via INTRAMUSCULAR

## 2015-01-10 ENCOUNTER — Encounter: Payer: Self-pay | Admitting: Internal Medicine

## 2015-01-10 ENCOUNTER — Ambulatory Visit
Admission: RE | Admit: 2015-01-10 | Discharge: 2015-01-10 | Disposition: A | Discharge status: Home / Self Care | Payer: BLUE CROSS/BLUE SHIELD | Source: Ambulatory Visit | Attending: Internal Medicine | Admitting: Internal Medicine

## 2015-01-10 ENCOUNTER — Ambulatory Visit (INDEPENDENT_AMBULATORY_CARE_PROVIDER_SITE_OTHER): Payer: BLUE CROSS/BLUE SHIELD | Admitting: Internal Medicine

## 2015-01-10 VITALS — BP 128/87 | HR 80 | Temp 98.2°F | Ht 62.75 in | Wt 201.1 lb

## 2015-01-10 DIAGNOSIS — M542 Cervicalgia: Secondary | ICD-10-CM

## 2015-01-10 DIAGNOSIS — M47812 Spondylosis without myelopathy or radiculopathy, cervical region: Secondary | ICD-10-CM | POA: Insufficient documentation

## 2015-01-10 MED ORDER — CYCLOBENZAPRINE HCL 5 MG PO TABS: 5.0000 mg | ORAL_TABLET | Freq: Three times a day (TID) | ORAL | Status: DC | PRN

## 2015-01-10 NOTE — Patient Instructions (Signed)
Start Cyclobenzaprine 5-10mg  every 8 hours as needed for pain. This medicine may make you drowsy.  Go to [facility] for xray of the cervical spine today.

## 2015-01-10 NOTE — Progress Notes (Signed)
Pre visit review using our clinic review tool, if applicable. No additional management support is needed unless otherwise documented below in the visit note. 

## 2015-01-10 NOTE — Assessment & Plan Note (Signed)
Right neck pain with trapezius spasm. Given decreased grip strength and numbness, concern for radiculopathy. Will start Flexeril prn. Xray cervical spine. Discussed potentially ordering MRI lumbar spine. Follow up in 1 week.

## 2015-01-10 NOTE — Progress Notes (Addendum)
Subjective:    Patient ID: Chelsea Nolan, female    DOB: 1981-02-18, 34 y.o.   MRN: [id number]  HPI  34YO female presents for acute visit.  Severe right neck pain, radiates down to right hand, starting a couple of days ago. Hand is numb and tingling. Grip is weak. Tried Ibuprofen, Aleve with no improvement. Tried icing the area and using BioFreeze with no improvement. No known injury.  Past medical, surgical, family and social history per today's encounter.  Review of Systems  Constitutional: Negative for fever, chills, appetite change, fatigue and unexpected weight change.  Eyes: Negative for visual disturbance.  Respiratory: Negative for shortness of breath.   Cardiovascular: Negative for chest pain and leg swelling.  Gastrointestinal: Negative for nausea, vomiting, abdominal pain, diarrhea and constipation.  Musculoskeletal: Positive for myalgias, back pain, arthralgias and neck pain. Negative for neck stiffness.  Skin: Negative for color change and rash.  Neurological: Positive for weakness and numbness. Negative for dizziness, facial asymmetry, light-headedness and headaches.  Hematological: Negative for adenopathy. Does not bruise/bleed easily.  Psychiatric/Behavioral: Negative for sleep disturbance and dysphoric mood. The patient is not nervous/anxious.        Objective:    BP 128/87 mmHg  Pulse 80  Temp(Src) 98.2 F (36.8 C) (Oral)  Ht 5' 2.75" (1.594 m)  Wt 201 lb 2 oz (91.23 kg)  BMI 35.91 kg/m2  SpO2 98%  LMP 01/07/2015 Physical Exam  Constitutional: She is oriented to person, place, and time. She appears well-developed and well-nourished. No distress.  HENT:  Head: Normocephalic and atraumatic.  Right Ear: External ear normal.  Left Ear: External ear normal.  Nose: Nose normal.  Mouth/Throat: Oropharynx is clear and moist.  Eyes: Conjunctivae are normal. Pupils are equal, round, and reactive to light. Right eye exhibits no discharge. Left eye exhibits no  discharge. No scleral icterus.  Neck: Normal range of motion. Neck supple. No tracheal deviation present. No thyromegaly present.  Cardiovascular: Normal rate, regular rhythm, normal heart sounds and intact distal pulses.  Exam reveals no gallop and no friction rub.   No murmur heard. Pulmonary/Chest: Effort normal and breath sounds normal. No respiratory distress. She has no wheezes. She has no rales. She exhibits no tenderness.  Musculoskeletal: Normal range of motion. She exhibits no edema or tenderness.       Back:       Right hand: Decreased strength (4+/5 grip strength) noted.  Lymphadenopathy:    She has no cervical adenopathy.  Neurological: She is alert and oriented to person, place, and time. She displays no atrophy and no tremor. No cranial nerve deficit or sensory deficit. She exhibits normal muscle tone. Coordination and gait normal.  Skin: Skin is warm and dry. No rash noted. She is not diaphoretic. No erythema. No pallor.  Psychiatric: She has a normal mood and affect. Her behavior is normal. Judgment and thought content normal.          Assessment & Plan:   Problem List Items Addressed This Visit      Unprioritized   Neck pain - Primary    Right neck pain with trapezius spasm. Given decreased grip strength and numbness, concern for radiculopathy. Will start Flexeril prn. Xray cervical spine. Discussed potentially ordering MRI lumbar spine. Follow up in 1 week.      Relevant Medications   cyclobenzaprine (FLEXERIL) 5 MG tablet   Other Relevant Orders   DG Cervical Spine Complete       Return in  about 1 week (around 01/17/2015) for Recheck.

## 2015-01-14 ENCOUNTER — Encounter: Payer: Self-pay | Admitting: Internal Medicine

## 2015-01-14 DIAGNOSIS — M542 Cervicalgia: Secondary | ICD-10-CM

## 2015-01-16 ENCOUNTER — Encounter: Payer: Self-pay | Admitting: Internal Medicine

## 2015-01-16 ENCOUNTER — Ambulatory Visit (INDEPENDENT_AMBULATORY_CARE_PROVIDER_SITE_OTHER): Payer: BLUE CROSS/BLUE SHIELD | Admitting: Internal Medicine

## 2015-01-16 VITALS — BP 144/88 | HR 97 | Temp 98.7°F | Ht 62.75 in | Wt 205.5 lb

## 2015-01-16 DIAGNOSIS — G43109 Migraine with aura, not intractable, without status migrainosus: Secondary | ICD-10-CM | POA: Diagnosis not present

## 2015-01-16 DIAGNOSIS — M542 Cervicalgia: Secondary | ICD-10-CM

## 2015-01-16 DIAGNOSIS — G43909 Migraine, unspecified, not intractable, without status migrainosus: Secondary | ICD-10-CM | POA: Insufficient documentation

## 2015-01-16 MED ORDER — TRAMADOL HCL 50 MG PO TABS: 50.0000 mg | ORAL_TABLET | Freq: Three times a day (TID) | ORAL | Status: DC | PRN

## 2015-01-16 MED ORDER — BUTALBITAL-APAP-CAFFEINE 50-325-40 MG PO TABS: 1.0000 | ORAL_TABLET | Freq: Two times a day (BID) | ORAL | Status: DC | PRN

## 2015-01-16 NOTE — Progress Notes (Signed)
Subjective:    Patient ID: Chelsea Nolan, female    DOB: [birth year], 34 y.o.   MRN: [id number]  HPI  34YO female presents for follow up.  Recently seen for neck and arm pain. Started on Flexeril with no improvement. Went to ED at [facility] and started on Tramadol and Naprosyn with minimal improvement. Tried Prednisone but it made her nauseous. Continues to have aching pain in right arm and neck.  Recently seen by OB and discussing options for control of heavy menstrual cycles.  Past medical, surgical, family and social history per today's encounter.  Review of Systems  Constitutional: Negative for fever, chills, appetite change, fatigue and unexpected weight change.  Eyes: Negative for visual disturbance.  Respiratory: Negative for shortness of breath.   Cardiovascular: Negative for chest pain and leg swelling.  Gastrointestinal: Negative for abdominal pain.  Genitourinary: Positive for menstrual problem.  Musculoskeletal: Positive for myalgias, back pain, arthralgias and neck pain.  Skin: Negative for color change and rash.  Neurological: Positive for weakness and numbness.  Hematological: Negative for adenopathy. Does not bruise/bleed easily.  Psychiatric/Behavioral: Negative for dysphoric mood. The patient is not nervous/anxious.        Objective:    BP 144/88 mmHg  Pulse 97  Temp(Src) 98.7 F (37.1 C) (Oral)  Ht 5' 2.75" (1.594 m)  Wt 205 lb 8 oz (93.214 kg)  BMI 36.69 kg/m2  SpO2 95%  LMP 01/07/2015 (Exact Date) Physical Exam  Constitutional: She is oriented to person, place, and time. She appears well-developed and well-nourished. No distress.  HENT:  Head: Normocephalic and atraumatic.  Right Ear: External ear normal.  Left Ear: External ear normal.  Nose: Nose normal.  Mouth/Throat: Oropharynx is clear and moist. No oropharyngeal exudate.  Eyes: Conjunctivae are normal. Pupils are equal, round, and reactive to light. Right eye exhibits no discharge. Left eye  exhibits no discharge. No scleral icterus.  Neck: Normal range of motion. Neck supple. Muscular tenderness (right neck) present. No spinous process tenderness present. No rigidity. No tracheal deviation and no edema present. No thyromegaly present.    Cardiovascular: Normal rate, regular rhythm, normal heart sounds and intact distal pulses.  Exam reveals no gallop and no friction rub.   No murmur heard. Pulmonary/Chest: Effort normal and breath sounds normal. No respiratory distress. She has no wheezes. She has no rales. She exhibits no tenderness.  Musculoskeletal: Normal range of motion. She exhibits no edema or tenderness.  Lymphadenopathy:    She has no cervical adenopathy.  Neurological: She is alert and oriented to person, place, and time. No cranial nerve deficit. She exhibits normal muscle tone. Coordination normal.  Skin: Skin is warm and dry. No rash noted. She is not diaphoretic. No erythema. No pallor.  Psychiatric: She has a normal mood and affect. Her behavior is normal. Judgment and thought content normal.          Assessment & Plan:   Problem List Items Addressed This Visit      Unprioritized   Migraine with aura    Discussed risks of combined OCP in patient with migraine with aura. She will discuss other options with her OB.      Relevant Medications   traMADol (ULTRAM) 50 MG tablet   butalbital-acetaminophen-caffeine (FIORICET, ESGIC) 50-325-40 MG per tablet   Neck pain - Primary    Right neck pain concerning for radiculopathy. Will continue prn Tramadol. MRI cervical spine pending for next week.  Return in about 3 weeks (around 02/06/2015) for Recheck.

## 2015-01-16 NOTE — Assessment & Plan Note (Signed)
Discussed risks of combined OCP in patient with migraine with aura. She will discuss other options with her OB.

## 2015-01-16 NOTE — Progress Notes (Signed)
Pre visit review using our clinic review tool, if applicable. No additional management support is needed unless otherwise documented below in the visit note. 

## 2015-01-16 NOTE — Patient Instructions (Signed)
Increase Tramadol to 50-100mg  by mouth every 8 hours as needed.  MRI Cervical spine as scheduled next week.  Follow up in 3 weeks.

## 2015-01-16 NOTE — Assessment & Plan Note (Signed)
Right neck pain concerning for radiculopathy. Will continue prn Tramadol. MRI cervical spine pending for next week.

## 2015-01-20 ENCOUNTER — Encounter: Payer: Self-pay | Admitting: Emergency Medicine

## 2015-01-20 ENCOUNTER — Other Ambulatory Visit: Payer: Self-pay

## 2015-01-20 DIAGNOSIS — Y9389 Activity, other specified: Secondary | ICD-10-CM | POA: Insufficient documentation

## 2015-01-20 DIAGNOSIS — R55 Syncope and collapse: Secondary | ICD-10-CM | POA: Diagnosis present

## 2015-01-20 DIAGNOSIS — Z880 Allergy status to penicillin: Secondary | ICD-10-CM | POA: Diagnosis not present

## 2015-01-20 DIAGNOSIS — S8991XA Unspecified injury of right lower leg, initial encounter: Secondary | ICD-10-CM | POA: Insufficient documentation

## 2015-01-20 DIAGNOSIS — Y92009 Unspecified place in unspecified non-institutional (private) residence as the place of occurrence of the external cause: Secondary | ICD-10-CM | POA: Insufficient documentation

## 2015-01-20 DIAGNOSIS — Z3202 Encounter for pregnancy test, result negative: Secondary | ICD-10-CM | POA: Insufficient documentation

## 2015-01-20 DIAGNOSIS — Z79899 Other long term (current) drug therapy: Secondary | ICD-10-CM | POA: Diagnosis not present

## 2015-01-20 DIAGNOSIS — W1839XA Other fall on same level, initial encounter: Secondary | ICD-10-CM | POA: Diagnosis not present

## 2015-01-20 DIAGNOSIS — S99912A Unspecified injury of left ankle, initial encounter: Secondary | ICD-10-CM | POA: Insufficient documentation

## 2015-01-20 DIAGNOSIS — S99911A Unspecified injury of right ankle, initial encounter: Secondary | ICD-10-CM | POA: Diagnosis not present

## 2015-01-20 DIAGNOSIS — Z720 Tobacco use: Secondary | ICD-10-CM | POA: Insufficient documentation

## 2015-01-20 DIAGNOSIS — S199XXA Unspecified injury of neck, initial encounter: Secondary | ICD-10-CM | POA: Diagnosis not present

## 2015-01-20 DIAGNOSIS — Y998 Other external cause status: Secondary | ICD-10-CM | POA: Insufficient documentation

## 2015-01-20 DIAGNOSIS — S0990XA Unspecified injury of head, initial encounter: Secondary | ICD-10-CM | POA: Insufficient documentation

## 2015-01-20 DIAGNOSIS — I10 Essential (primary) hypertension: Secondary | ICD-10-CM | POA: Diagnosis not present

## 2015-01-20 DIAGNOSIS — S299XXA Unspecified injury of thorax, initial encounter: Secondary | ICD-10-CM | POA: Diagnosis not present

## 2015-01-20 DIAGNOSIS — S79911A Unspecified injury of right hip, initial encounter: Secondary | ICD-10-CM | POA: Insufficient documentation

## 2015-01-20 LAB — CBC WITH DIFFERENTIAL/PLATELET
BASOS ABS: 0.1 10*3/uL (ref 0–0.1)
Basophils Relative: 1 %
EOS ABS: 0.7 10*3/uL (ref 0–0.7)
Eosinophils Relative: 6 %
HCT: 39.4 % (ref 35.0–47.0)
HEMOGLOBIN: 13.4 g/dL (ref 12.0–16.0)
LYMPHS PCT: 39 %
Lymphs Abs: 4.3 10*3/uL — ABNORMAL HIGH (ref 1.0–3.6)
MCH: 32.5 pg (ref 26.0–34.0)
MCHC: 33.9 g/dL (ref 32.0–36.0)
MCV: 95.8 fL (ref 80.0–100.0)
MONO ABS: 0.6 10*3/uL (ref 0.2–0.9)
Monocytes Relative: 5 %
Neutro Abs: 5.3 10*3/uL (ref 1.4–6.5)
Neutrophils Relative %: 49 %
Platelets: 303 10*3/uL (ref 150–440)
RBC: 4.12 MIL/uL (ref 3.80–5.20)
RDW: 14.3 % (ref 11.5–14.5)
WBC: 10.9 10*3/uL (ref 3.6–11.0)

## 2015-01-20 NOTE — ED Notes (Signed)
Pt presents to ER alert and in NAD. Family states she had LOC at home. Pt has hx of same.

## 2015-01-21 ENCOUNTER — Emergency Department: Payer: BLUE CROSS/BLUE SHIELD

## 2015-01-21 ENCOUNTER — Emergency Department
Admission: EM | Admit: 2015-01-21 | Discharge: 2015-01-21 | Disposition: A | Discharge status: Home / Self Care | Payer: BLUE CROSS/BLUE SHIELD | Attending: Emergency Medicine | Admitting: Emergency Medicine

## 2015-01-21 ENCOUNTER — Encounter: Payer: Self-pay | Admitting: Emergency Medicine

## 2015-01-21 DIAGNOSIS — R51 Headache: Secondary | ICD-10-CM

## 2015-01-21 DIAGNOSIS — R519 Headache, unspecified: Secondary | ICD-10-CM

## 2015-01-21 DIAGNOSIS — M25559 Pain in unspecified hip: Secondary | ICD-10-CM

## 2015-01-21 DIAGNOSIS — R55 Syncope and collapse: Secondary | ICD-10-CM

## 2015-01-21 HISTORY — DX: Rheumatoid arthritis, unspecified: M06.9

## 2015-01-21 HISTORY — DX: Calculus of kidney: N20.0

## 2015-01-21 LAB — COMPREHENSIVE METABOLIC PANEL
ALT: 30 U/L (ref 14–54)
AST: 36 U/L (ref 15–41)
Albumin: 3.6 g/dL (ref 3.5–5.0)
Alkaline Phosphatase: 84 U/L (ref 38–126)
Anion gap: 4 — ABNORMAL LOW (ref 5–15)
BUN: 9 mg/dL (ref 6–20)
CALCIUM: 8.7 mg/dL — AB (ref 8.9–10.3)
CO2: 27 mmol/L (ref 22–32)
CREATININE: 0.63 mg/dL (ref 0.44–1.00)
Chloride: 103 mmol/L (ref 101–111)
GLUCOSE: 111 mg/dL — AB (ref 65–99)
Potassium: 3.3 mmol/L — ABNORMAL LOW (ref 3.5–5.1)
SODIUM: 134 mmol/L — AB (ref 135–145)
Total Bilirubin: 0.3 mg/dL (ref 0.3–1.2)
Total Protein: 6.9 g/dL (ref 6.5–8.1)

## 2015-01-21 LAB — URINE DRUG SCREEN, QUALITATIVE (ARMC ONLY)
AMPHETAMINES, UR SCREEN: NOT DETECTED
Barbiturates, Ur Screen: POSITIVE — AB
Benzodiazepine, Ur Scrn: POSITIVE — AB
Cannabinoid 50 Ng, Ur ~~LOC~~: NOT DETECTED
Cocaine Metabolite,Ur ~~LOC~~: NOT DETECTED
MDMA (ECSTASY) UR SCREEN: NOT DETECTED
Methadone Scn, Ur: NOT DETECTED
Opiate, Ur Screen: NOT DETECTED
PHENCYCLIDINE (PCP) UR S: NOT DETECTED
Tricyclic, Ur Screen: POSITIVE — AB

## 2015-01-21 LAB — URINALYSIS COMPLETE WITH MICROSCOPIC (ARMC ONLY)
Bilirubin Urine: NEGATIVE
Glucose, UA: NEGATIVE mg/dL
HGB URINE DIPSTICK: NEGATIVE
Ketones, ur: NEGATIVE mg/dL
Leukocytes, UA: NEGATIVE
NITRITE: NEGATIVE
Protein, ur: NEGATIVE mg/dL
SPECIFIC GRAVITY, URINE: 1.02 (ref 1.005–1.030)
pH: 5 (ref 5.0–8.0)

## 2015-01-21 LAB — PREGNANCY, URINE: Preg Test, Ur: NEGATIVE

## 2015-01-21 LAB — TROPONIN I

## 2015-01-21 LAB — ETHANOL: Alcohol, Ethyl (B): <5 mg/dL (ref <5)

## 2015-01-21 MED ORDER — SODIUM CHLORIDE 0.9 % IV BOLUS (SEPSIS)
1000.0000 mL | Freq: Once | INTRAVENOUS | Status: AC
  Administered 2015-01-21: 1000 mL via INTRAVENOUS

## 2015-01-21 MED ORDER — BUTALBITAL-APAP-CAFFEINE 50-325-40 MG PO TABS
2.0000 | ORAL_TABLET | Freq: Once | ORAL | Status: AC
  Administered 2015-01-21: 2 via ORAL

## 2015-01-21 MED ORDER — BUTALBITAL-APAP-CAFFEINE 50-325-40 MG PO TABS
ORAL_TABLET | ORAL | Status: AC
  Administered 2015-01-21: 2 via ORAL
  Filled 2015-01-21: qty 2

## 2015-01-21 NOTE — Discharge Instructions (Signed)
Syncope °Syncope is a medical term for fainting or passing out. This means you lose consciousness and drop to the ground. People are generally unconscious for less than 5 minutes. You may have some muscle twitches for up to 15 seconds before waking up and returning to normal. Syncope occurs more often in older adults, but it can happen to anyone. While most causes of syncope are not dangerous, syncope can be a sign of a serious medical problem. It is important to seek medical care.  °CAUSES  °Syncope is caused by a sudden drop in blood flow to the brain. The specific cause is often not determined. Factors that can bring on syncope include: °· Taking medicines that lower blood pressure. °· Sudden changes in posture, such as standing up quickly. °· Taking more medicine than prescribed. °· Standing in one place for too long. °· Seizure disorders. °· Dehydration and excessive exposure to heat. °· Low blood sugar (hypoglycemia). °· Straining to have a bowel movement. °· Heart disease, irregular heartbeat, or other circulatory problems. °· Fear, emotional distress, seeing blood, or severe pain. °SYMPTOMS  °Right before fainting, you may: °· Feel dizzy or light-headed. °· Feel nauseous. °· See all white or all black in your field of vision. °· Have cold, clammy skin. °DIAGNOSIS  °Your health care provider will ask about your symptoms, perform a physical exam, and perform an electrocardiogram (ECG) to record the electrical activity of your heart. Your health care provider may also perform other heart or blood tests to determine the cause of your syncope which may include: °· Transthoracic echocardiogram (TTE). During echocardiography, sound waves are used to evaluate how blood flows through your heart. °· Transesophageal echocardiogram (TEE). °· Cardiac monitoring. This allows your health care provider to monitor your heart rate and rhythm in real time. °· Holter monitor. This is a portable device that records your  heartbeat and can help diagnose heart arrhythmias. It allows your health care provider to track your heart activity for several days, if needed. °· Stress tests by exercise or by giving medicine that makes the heart beat faster. °TREATMENT  °In most cases, no treatment is needed. Depending on the cause of your syncope, your health care provider may recommend changing or stopping some of your medicines. °HOME CARE INSTRUCTIONS °· Have someone stay with you until you feel stable. °· Do not drive, use machinery, or play sports until your health care provider says it is okay. °· Keep all follow-up appointments as directed by your health care provider. °· Lie down right away if you start feeling like you might faint. Breathe deeply and steadily. Wait until all the symptoms have passed. °· Drink enough fluids to keep your urine clear or pale yellow. °· If you are taking blood pressure or heart medicine, get up slowly and take several minutes to sit and then stand. This can reduce dizziness. °SEEK IMMEDIATE MEDICAL CARE IF:  °· You have a severe headache. °· You have unusual pain in the chest, abdomen, or back. °· You are bleeding from your mouth or rectum, or you have black or tarry stool. °· You have an irregular or very fast heartbeat. °· You have pain with breathing. °· You have repeated fainting or seizure-like jerking during an episode. °· You faint when sitting or lying down. °· You have confusion. °· You have trouble walking. °· You have severe weakness. °· You have vision problems. °If you fainted, call your local emergency services (911 in [place] Do not drive   yourself to the hospital.  MAKE SURE YOU:  Understand these instructions.  Will watch your condition.  Will get help right away if you are not doing well or get worse. Document Released: 07/20/2005 Document Revised: 07/25/2013 Document Reviewed: 09/18/2011 [facility] Patient Information 2015 [place], [place]. This information is not intended to replace  advice given to you by your health care provider. Make sure you discuss any questions you have with your health care provider.  Headaches, Frequently Asked Questions MIGRAINE HEADACHES Q: What is migraine? What causes it? How can I treat it? A: Generally, migraine headaches begin as a dull ache. Then they develop into a constant, throbbing, and pulsating pain. You may experience pain at the temples. You may experience pain at the front or back of one or both sides of the head. The pain is usually accompanied by a combination of:  Nausea.  Vomiting.  Sensitivity to light and noise. Some people (about 15%) experience an aura (see below) before an attack. The cause of migraine is believed to be chemical reactions in the brain. Treatment for migraine may include over-the-counter or prescription medications. It may also include self-help techniques. These include relaxation training and biofeedback.  Q: What is an aura? A: About 15% of people with migraine get an "aura". This is a sign of neurological symptoms that occur before a migraine headache. You may see wavy or jagged lines, dots, or flashing lights. You might experience tunnel vision or blind spots in one or both eyes. The aura can include visual or auditory hallucinations (something imagined). It may include disruptions in smell (such as strange odors), taste or touch. Other symptoms include:  Numbness.  A "pins and needles" sensation.  Difficulty in recalling or speaking the correct word. These neurological events may last as long as 60 minutes. These symptoms will fade as the headache begins. Q: What is a trigger? A: Certain physical or environmental factors can lead to or "trigger" a migraine. These include:  Foods.  Hormonal changes.  Weather.  Stress. It is important to remember that triggers are different for everyone. To help prevent migraine attacks, you need to figure out which triggers affect you. Keep a headache diary.  This is a good way to track triggers. The diary will help you talk to your healthcare professional about your condition. Q: Does weather affect migraines? A: Bright sunshine, hot, humid conditions, and drastic changes in barometric pressure may lead to, or "trigger," a migraine attack in some people. But studies have shown that weather does not act as a trigger for everyone with migraines. Q: What is the link between migraine and hormones? A: Hormones start and regulate many of your body's functions. Hormones keep your body in balance within a constantly changing environment. The levels of hormones in your body are unbalanced at times. Examples are during menstruation, pregnancy, or menopause. That can lead to a migraine attack. In fact, about three quarters of all women with migraine report that their attacks are related to the menstrual cycle.  Q: Is there an increased risk of stroke for migraine sufferers? A: The likelihood of a migraine attack causing a stroke is very remote. That is not to say that migraine sufferers cannot have a stroke associated with their migraines. In persons under age 18, the most common associated factor for stroke is migraine headache. But over the course of a person's normal life span, the occurrence of migraine headache may actually be associated with a reduced risk of dying from cerebrovascular  disease due to stroke.  Q: What are acute medications for migraine? A: Acute medications are used to treat the pain of the headache after it has started. Examples over-the-counter medications, NSAIDs, ergots, and triptans.  Q: What are the triptans? A: Triptans are the newest class of abortive medications. They are specifically targeted to treat migraine. Triptans are vasoconstrictors. They moderate some chemical reactions in the brain. The triptans work on receptors in your brain. Triptans help to restore the balance of a neurotransmitter called serotonin. Fluctuations in levels of  serotonin are thought to be a main cause of migraine.  Q: Are over-the-counter medications for migraine effective? A: Over-the-counter, or "OTC," medications may be effective in relieving mild to moderate pain and associated symptoms of migraine. But you should see your caregiver before beginning any treatment regimen for migraine.  Q: What are preventive medications for migraine? A: Preventive medications for migraine are sometimes referred to as "prophylactic" treatments. They are used to reduce the frequency, severity, and length of migraine attacks. Examples of preventive medications include antiepileptic medications, antidepressants, beta-blockers, calcium channel blockers, and NSAIDs (nonsteroidal anti-inflammatory drugs). Q: Why are anticonvulsants used to treat migraine? A: During the past few years, there has been an increased interest in antiepileptic drugs for the prevention of migraine. They are sometimes referred to as "anticonvulsants". Both epilepsy and migraine may be caused by similar reactions in the brain.  Q: Why are antidepressants used to treat migraine? A: Antidepressants are typically used to treat people with depression. They may reduce migraine frequency by regulating chemical levels, such as serotonin, in the brain.  Q: What alternative therapies are used to treat migraine? A: The term "alternative therapies" is often used to describe treatments considered outside the scope of conventional Western medicine. Examples of alternative therapy include acupuncture, acupressure, and yoga. Another common alternative treatment is herbal therapy. Some herbs are believed to relieve headache pain. Always discuss alternative therapies with your caregiver before proceeding. Some herbal products contain arsenic and other toxins. TENSION HEADACHES Q: What is a tension-type headache? What causes it? How can I treat it? A: Tension-type headaches occur randomly. They are often the result of  temporary stress, anxiety, fatigue, or anger. Symptoms include soreness in your temples, a tightening band-like sensation around your head (a "vice-like" ache). Symptoms can also include a pulling feeling, pressure sensations, and contracting head and neck muscles. The headache begins in your forehead, temples, or the back of your head and neck. Treatment for tension-type headache may include over-the-counter or prescription medications. Treatment may also include self-help techniques such as relaxation training and biofeedback. CLUSTER HEADACHES Q: What is a cluster headache? What causes it? How can I treat it? A: Cluster headache gets its name because the attacks come in groups. The pain arrives with little, if any, warning. It is usually on one side of the head. A tearing or bloodshot eye and a runny nose on the same side of the headache may also accompany the pain. Cluster headaches are believed to be caused by chemical reactions in the brain. They have been described as the most severe and intense of any headache type. Treatment for cluster headache includes prescription medication and oxygen. SINUS HEADACHES Q: What is a sinus headache? What causes it? How can I treat it? A: When a cavity in the bones of the face and skull (a sinus) becomes inflamed, the inflammation will cause localized pain. This condition is usually the result of an allergic reaction, a tumor, or an  infection. If your headache is caused by a sinus blockage, such as an infection, you will probably have a fever. An x-ray will confirm a sinus blockage. Your caregiver's treatment might include antibiotics for the infection, as well as antihistamines or decongestants.  REBOUND HEADACHES Q: What is a rebound headache? What causes it? How can I treat it? A: A pattern of taking acute headache medications too often can lead to a condition known as "rebound headache." A pattern of taking too much headache medication includes taking it more  than 2 days per week or in excessive amounts. That means more than the label or a caregiver advises. With rebound headaches, your medications not only stop relieving pain, they actually begin to cause headaches. Doctors treat rebound headache by tapering the medication that is being overused. Sometimes your caregiver will gradually substitute a different type of treatment or medication. Stopping may be a challenge. Regularly overusing a medication increases the potential for serious side effects. Consult a caregiver if you regularly use headache medications more than 2 days per week or more than the label advises. ADDITIONAL QUESTIONS AND ANSWERS Q: What is biofeedback? A: Biofeedback is a self-help treatment. Biofeedback uses special equipment to monitor your body's involuntary physical responses. Biofeedback monitors:  Breathing.  Pulse.  Heart rate.  Temperature.  Muscle tension.  Brain activity. Biofeedback helps you refine and perfect your relaxation exercises. You learn to control the physical responses that are related to stress. Once the technique has been mastered, you do not need the equipment any more. Q: Are headaches hereditary? A: Four out of five (80%) of people that suffer report a family history of migraine. Scientists are not sure if this is genetic or a family predisposition. Despite the uncertainty, a child has a 50% chance of having migraine if one parent suffers. The child has a 75% chance if both parents suffer.  Q: Can children get headaches? A: By the time they reach high school, most young people have experienced some type of headache. Many safe and effective approaches or medications can prevent a headache from occurring or stop it after it has begun.  Q: What type of doctor should I see to diagnose and treat my headache? A: Start with your primary caregiver. Discuss his or her experience and approach to headaches. Discuss methods of classification, diagnosis, and  treatment. Your caregiver may decide to recommend you to a headache specialist, depending upon your symptoms or other physical conditions. Having diabetes, allergies, etc., may require a more comprehensive and inclusive approach to your headache. The National Head[employer] Foundation will provide, upon request, a list of [facility] physician members in your state. Document Released: 10/10/2003 Document Revised: 10/12/2011 Document Reviewed: 03/19/2008 [facility] Patient Information 2015 [place], [place]. This information is not intended to replace advice given to you by your health care provider. Make sure you discuss any questions you have with your health care provider.
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
## 2015-01-22 ENCOUNTER — Telehealth: Payer: Self-pay | Admitting: Internal Medicine

## 2015-01-22 ENCOUNTER — Ambulatory Visit: Admission: RE | Admit: 2015-01-22 | Payer: BLUE CROSS/BLUE SHIELD | Source: Ambulatory Visit

## 2015-01-22 NOTE — Telephone Encounter (Signed)
[facility] [place] [facility] [place] TELEPHONE ADVICE RECORD   [facility]     Patient Name: Chelsea Nolan Initial Comment Caller states passed out and need to let my dr know what is going on had a rattle in my chest don't know if it was congestion or what feet swollen since Saturday   DOB: April 03, 1981      Nurse Assessment  Nurse: Harlon Flor, RN, Darl Pikes Date/Time (Eastern Time): 01/22/2015 4:34:17 PM  Confirm and document reason for call. If symptomatic, describe symptoms. ---Caller states passed out and need to let my dr know what is going on had a rattle in my chest don't know if it was congestion or what feet swollen since Saturday . She passed out Sunday night as she walked through her living room. Spouse called 911 : and she was taken to the ED: no DX. told to followup with her DR in 2 days. Xrays were done on hip RT where she fell. Right side of her forehead too. She had CT of head as well. After she left ED she only had labs blood and urine RX migraine meds that she would normally take. She slept most of the days since then. Call disconnected. Nurse able to reach pt back. Today she has a rattle in her chest ., sore in chest. no fever She did have some sinus congestion recently. She has not been dieting . Not pregnant  Has the patient traveled out of the country within the last 30 days? ---No  Does the patient require triage? ---Yes  Related visit to physician within the last 2 weeks? ---Yes  Does the PT have any chronic conditions? (i.e. diabetes, asthma, etc.) ---Yes  List chronic conditions. ---migraines psoriatic arthritis hx of HTN Dr Dan Humphreys had put her on a weight loss ; off now WT 198 lbs has gained most of it back LMP a week ago anxiety, PTSD  Did the patient indicate they were pregnant? ---No    Guidelines     Guideline Title Affirmed Question Affirmed Notes   Cough - Acute Non-Productive Cough present > 10 days    Final Disposition User   See PCP When  Office is Open (within 3 days) Harlon Flor, Charity fundraiser, Darl Pikes

## 2015-01-22 NOTE — Telephone Encounter (Deleted)
Pt called that

## 2015-01-22 NOTE — Telephone Encounter (Signed)
FYI

## 2015-01-23 ENCOUNTER — Ambulatory Visit: Payer: BLUE CROSS/BLUE SHIELD | Admitting: Internal Medicine

## 2015-01-28 ENCOUNTER — Ambulatory Visit: Payer: BLUE CROSS/BLUE SHIELD | Admitting: Internal Medicine

## 2015-01-28 DIAGNOSIS — Z0289 Encounter for other administrative examinations: Secondary | ICD-10-CM

## 2015-02-21 ENCOUNTER — Ambulatory Visit (INDEPENDENT_AMBULATORY_CARE_PROVIDER_SITE_OTHER): Payer: BLUE CROSS/BLUE SHIELD | Admitting: Internal Medicine

## 2015-02-21 ENCOUNTER — Encounter: Payer: Self-pay | Admitting: Internal Medicine

## 2015-02-21 VITALS — BP 134/71 | HR 89 | Temp 98.5°F | Ht 62.75 in | Wt 200.0 lb

## 2015-02-21 DIAGNOSIS — Z720 Tobacco use: Secondary | ICD-10-CM | POA: Diagnosis not present

## 2015-02-21 DIAGNOSIS — N920 Excessive and frequent menstruation with regular cycle: Secondary | ICD-10-CM | POA: Diagnosis not present

## 2015-02-21 DIAGNOSIS — M542 Cervicalgia: Secondary | ICD-10-CM | POA: Diagnosis not present

## 2015-02-21 MED ORDER — BUTALBITAL-APAP-CAFFEINE 50-325-40 MG PO TABS: 1.0000 | ORAL_TABLET | Freq: Two times a day (BID) | ORAL | Status: DC | PRN

## 2015-02-21 MED ORDER — VARENICLINE TARTRATE 0.5 MG X 11 & 1 MG X 42 PO MISC: ORAL | Status: DC

## 2015-02-21 NOTE — Assessment & Plan Note (Signed)
Persistent menorrhagia. Scheduled for hysterectomy. By modified criteria, she is low risk for perioperative complications. Recent labs were stable. Recommend to proceed with surgery.

## 2015-02-21 NOTE — Patient Instructions (Signed)
Follow up after hysterectomy.

## 2015-02-21 NOTE — Progress Notes (Signed)
Pre visit review using our clinic review tool, if applicable. No additional management support is needed unless otherwise documented below in the visit note. 

## 2015-02-21 NOTE — Progress Notes (Signed)
Subjective:    Patient ID: Chelsea Nolan, female    DOB: 1981/04/18, 34 y.o.   MRN: [id number]  HPI  34YO female presents for follow up.  Neck pain - Resolved with out intervention. She did not have MRI. Not taking any medication for pain.  Scheduled to have hysterectomy 8/18 with [facility]. No previous problems with anesthesia. No recent illnesses this month. Had URI last month, treated with antibiotics at [facility] ER. All symptoms resolved.   Past medical, surgical, family and social history per today's encounter.  Review of Systems  Constitutional: Negative for fever, chills, appetite change, fatigue and unexpected weight change.  Eyes: Negative for visual disturbance.  Respiratory: Negative for shortness of breath.   Cardiovascular: Negative for chest pain and leg swelling.  Gastrointestinal: Negative for abdominal pain.  Genitourinary: Positive for menstrual problem.  Musculoskeletal: Negative for myalgias, arthralgias and neck pain.  Skin: Negative for color change and rash.  Neurological: Positive for headaches.  Hematological: Negative for adenopathy. Does not bruise/bleed easily.  Psychiatric/Behavioral: Negative for dysphoric mood. The patient is not nervous/anxious.        Objective:    BP 134/71 mmHg  Pulse 89  Temp(Src) 98.5 F (36.9 C) (Oral)  Ht 5' 2.75" (1.594 m)  Wt 200 lb (90.719 kg)  BMI 35.70 kg/m2  SpO2 98%  LMP 02/06/2015 Physical Exam  Constitutional: She is oriented to person, place, and time. She appears well-developed and well-nourished. No distress.  HENT:  Head: Normocephalic and atraumatic.  Right Ear: External ear normal.  Left Ear: External ear normal.  Nose: Nose normal.  Mouth/Throat: Oropharynx is clear and moist. No oropharyngeal exudate.  Eyes: Conjunctivae are normal. Pupils are equal, round, and reactive to light. Right eye exhibits no discharge. Left eye exhibits no discharge. No scleral icterus.  Neck: Normal range of motion. Neck  supple. No tracheal deviation present. No thyromegaly present.  Cardiovascular: Normal rate, regular rhythm, normal heart sounds and intact distal pulses.  Exam reveals no gallop and no friction rub.   No murmur heard. Pulmonary/Chest: Effort normal and breath sounds normal. No respiratory distress. She has no wheezes. She has no rales. She exhibits no tenderness.  Musculoskeletal: Normal range of motion. She exhibits no edema or tenderness.  Lymphadenopathy:    She has no cervical adenopathy.  Neurological: She is alert and oriented to person, place, and time. No cranial nerve deficit. She exhibits normal muscle tone. Coordination normal.  Skin: Skin is warm and dry. No rash noted. She is not diaphoretic. No erythema. No pallor.  Psychiatric: She has a normal mood and affect. Her behavior is normal. Judgment and thought content normal.          Assessment & Plan:   Problem List Items Addressed This Visit      Unprioritized   Menorrhagia    Persistent menorrhagia. Scheduled for hysterectomy. By modified criteria, she is low risk for perioperative complications. Recent labs were stable. Recommend to proceed with surgery.      Neck pain - Primary    Neck pain improved. She has opted not to have MRI evaluation. Follow up prn.      Tobacco abuse    Encouraged smoking cessation. Will start Chantix. Discussed potential side effects of this medication.      Relevant Medications   varenicline (CHANTIX STARTING MONTH PAK) 0.5 MG X 11 & 1 MG X 42 tablet       Return in about 3 months (around 05/24/2015) for  Recheck.

## 2015-02-21 NOTE — Assessment & Plan Note (Signed)
Encouraged smoking cessation. Will start Chantix. Discussed potential side effects of this medication.

## 2015-02-21 NOTE — Assessment & Plan Note (Signed)
Neck pain improved. She has opted not to have MRI evaluation. Follow up prn.

## 2015-03-13 ENCOUNTER — Telehealth: Payer: Self-pay | Admitting: Internal Medicine

## 2015-03-13 NOTE — Telephone Encounter (Signed)
Pt states that Dr. Dan Humphreys told her that she did not have to worry about this appt because she's going in for a hysterectomy and she wants to see her after surgery. So I wanted to confirm this before cancelling? Thank You!

## 2015-03-13 NOTE — Telephone Encounter (Signed)
OK. Fine to cancel.

## 2015-03-14 ENCOUNTER — Ambulatory Visit: Payer: Self-pay | Admitting: Internal Medicine

## 2015-03-20 ENCOUNTER — Other Ambulatory Visit: Payer: Self-pay | Admitting: Internal Medicine

## 2015-03-21 MED ORDER — VARENICLINE TARTRATE 0.5 MG X 11 & 1 MG X 42 PO MISC: ORAL | Status: DC

## 2015-03-21 NOTE — Addendum Note (Signed)
Addended by: Ronna Polio A on: 03/21/2015 04:49 PM   Modules accepted: Orders

## 2015-03-22 ENCOUNTER — Telehealth: Payer: Self-pay

## 2015-03-22 NOTE — Telephone Encounter (Signed)
Rx faxed

## 2015-03-22 NOTE — Telephone Encounter (Signed)
Patient called stating that her pharmacy has not received her Rx for Chantix. Patient stated that she is trying really hard to quit smoking, and she needs this filled as soon as possible. Thank you!

## 2015-03-26 ENCOUNTER — Telehealth: Payer: Self-pay | Admitting: *Deleted

## 2015-03-26 NOTE — Telephone Encounter (Signed)
Called pharmacy and gave a verbal approval

## 2015-03-26 NOTE — Telephone Encounter (Signed)
Chelsea Nolan - Can you check on this?

## 2015-03-26 NOTE — Telephone Encounter (Signed)
Patient stated that she received a starter pack for chantix , patient stated that pharmacy advised her too have a "continuous pack" for chantix,not another starter pack for a refill. Patient would like a Rx for the "continous chantix". Thanks

## 2015-05-31 ENCOUNTER — Other Ambulatory Visit: Payer: Self-pay | Admitting: *Deleted

## 2015-05-31 ENCOUNTER — Encounter: Payer: Self-pay | Admitting: Internal Medicine

## 2015-05-31 MED ORDER — BUTALBITAL-APAP-CAFFEINE 50-325-40 MG PO TABS: 1.0000 | ORAL_TABLET | Freq: Two times a day (BID) | ORAL | Status: DC | PRN

## 2015-07-24 ENCOUNTER — Encounter: Payer: Self-pay | Admitting: Internal Medicine

## 2015-07-25 ENCOUNTER — Encounter: Payer: Self-pay | Admitting: Family Medicine

## 2015-07-25 ENCOUNTER — Ambulatory Visit (INDEPENDENT_AMBULATORY_CARE_PROVIDER_SITE_OTHER): Payer: BLUE CROSS/BLUE SHIELD | Admitting: Family Medicine

## 2015-07-25 VITALS — BP 146/92 | HR 94 | Temp 98.5°F | Ht 62.75 in | Wt 210.1 lb

## 2015-07-25 DIAGNOSIS — L4050 Arthropathic psoriasis, unspecified: Secondary | ICD-10-CM | POA: Diagnosis not present

## 2015-07-25 MED ORDER — PREDNISONE 50 MG PO TABS: ORAL_TABLET | ORAL | Status: DC

## 2015-07-25 MED ORDER — OXYCODONE-ACETAMINOPHEN 5-325 MG PO TABS: 1.0000 | ORAL_TABLET | Freq: Three times a day (TID) | ORAL | Status: DC | PRN

## 2015-07-25 NOTE — Assessment & Plan Note (Addendum)
Established problem; Acute flare with knee pain.  No evidence of septic joint on exam. Treating with Prednisone (patient okay with this as she has nausea with prednisone) and PRN percocet for pain. Patient needs close follow with rheumatology.

## 2015-07-25 NOTE — Patient Instructions (Signed)
It was nice to see you today.  Take the prednisone as prescribed.  Use the pain medication as needed.  Follow up closely with Rheumatology.  Take care  Dr. Adriana Simas

## 2015-07-25 NOTE — Progress Notes (Signed)
Subjective:  Patient ID: Chelsea Nolan, female    DOB: 04-30-1981  Age: 34 y.o. MRN: [id number]  CC: Knee pain  HPI:  34 year old female with a past medical history psoriatic arthritis presents to clinic today with complaints of knee pain.  Knee pain  Patient reports that on Monday she developed pain and swelling of the left knee.  No known inciting factor. No fall, trauma, injury.   She has been applying with her methotrexate.  Pain is severe and unrelenting. Worse with range of motion and ambulation.  She has been taking Aleve with no improvement.  She states that she is now developing right knee pain as well.  She states that she was unable to see her rheumatologist for acute visit.  Social Hx   Social History   Social History  . Marital Status: Married    Spouse Name: N/A  . Number of Children: N/A  . Years of Education: N/A   Social History Main Topics  . Smoking status: Current Every Day Smoker -- 0.50 packs/day for 15 years    Types: Cigarettes  . Smokeless tobacco: Never Used  . Alcohol Use: No  . Drug Use: No  . Sexual Activity: Yes   Other Topics Concern  . None   Social History Narrative   Lives in [place] with 2 daughters and husband. Dog, 3 cats, prairie dog.      Work - Printmaker      Diet - limited fried food, limited sweets      Exercise - none   Review of Systems  Constitutional: Negative.   Musculoskeletal:       Knee pain & swelling.   Objective:  BP 146/92 mmHg  Pulse 94  Temp(Src) 98.5 F (36.9 C) (Oral)  Ht 5' 2.75" (1.594 m)  Wt 210 lb 2 oz (95.312 kg)  BMI 37.51 kg/m2  SpO2 95%  BP/Weight 07/25/2015 02/21/2015 01/21/2015  Systolic BP 146 134 146  Diastolic BP 92 71 96  Wt. (Lbs) 210.13 200 -  BMI 37.51 35.7 36.57   Physical Exam  Constitutional: She appears well-developed.  Appears in pain.  Pulmonary/Chest: Effort normal.  Musculoskeletal:  Knee: Left  Inspection - Normal. No obvious  abnormalities. No erythema. Palpation with diffuse tenderness. Small effusion. Mild warmth. ROM decreased in all planes secondary to pain.   Neurological: She is alert.  Skin: Skin is warm and dry. No erythema.  Psychiatric: She has a normal mood and affect.  Vitals reviewed.  Lab Results  Component Value Date   WBC 10.9 01/20/2015   HGB 13.4 01/20/2015   HCT 39.4 01/20/2015   PLT 303 01/20/2015   GLUCOSE 111* 01/20/2015   ALT 30 01/20/2015   AST 36 01/20/2015   NA 134* 01/20/2015   K 3.3* 01/20/2015   CL 103 01/20/2015   CREATININE 0.63 01/20/2015   BUN 9 01/20/2015   CO2 27 01/20/2015   TSH 2.30 12/10/2014   MICROALBUR 0.5 01/26/2013   Assessment & Plan:   Problem List Items Addressed This Visit    Psoriatic arthritis (HCC) - Primary    Established problem; Acute flare with knee pain.  No evidence of septic joint on exam. Treating with Prednisone (patient okay with this as she has nausea with prednisone) and PRN percocet for pain. Patient needs close follow with rheumatology.         Meds ordered this encounter  Medications  . predniSONE (DELTASONE) 50 MG tablet  Sig: 1 tablet daily x 5 days.    Dispense:  5 tablet    Refill:  0  . oxyCODONE-acetaminophen (PERCOCET/ROXICET) 5-325 MG tablet    Sig: Take 1 tablet by mouth every 8 (eight) hours as needed for severe pain.    Dispense:  30 tablet    Refill:  0    Follow-up: Return if symptoms worsen or fail to improve.  Everlene Other DO [facility]

## 2015-07-25 NOTE — Progress Notes (Signed)
Pre visit review using our clinic review tool, if applicable. No additional management support is needed unless otherwise documented below in the visit note. 

## 2015-08-05 ENCOUNTER — Encounter: Payer: Self-pay | Admitting: Internal Medicine

## 2015-08-06 ENCOUNTER — Other Ambulatory Visit: Payer: Self-pay | Admitting: Internal Medicine

## 2015-08-06 ENCOUNTER — Other Ambulatory Visit: Payer: Self-pay

## 2015-08-06 ENCOUNTER — Telehealth: Payer: Self-pay | Admitting: Internal Medicine

## 2015-08-06 MED ORDER — BUTALBITAL-APAP-CAFFEINE 50-325-40 MG PO TABS: 1.0000 | ORAL_TABLET | Freq: Two times a day (BID) | ORAL | Status: DC | PRN

## 2015-08-06 NOTE — Telephone Encounter (Signed)
Called into the pharmacy.

## 2015-08-06 NOTE — Telephone Encounter (Signed)
[facility] [phone] pharmacist Yasmin called about pt medication butalbital-acetaminophen-caffeine (FIORICET, ESGIC) 50-325-40 MG tablet. She states it's not at that pharmacy. Pharmacy is [id number] - [place] ([place]), [place] - [address]. Thank You!

## 2015-08-08 ENCOUNTER — Encounter: Payer: Self-pay | Admitting: Internal Medicine

## 2015-08-09 ENCOUNTER — Ambulatory Visit: Payer: BLUE CROSS/BLUE SHIELD | Admitting: Internal Medicine

## 2015-09-11 ENCOUNTER — Emergency Department
Admission: EM | Admit: 2015-09-11 | Discharge: 2015-09-11 | Disposition: A | Discharge status: Home / Self Care | Payer: BLUE CROSS/BLUE SHIELD | Attending: Emergency Medicine | Admitting: Emergency Medicine

## 2015-09-11 ENCOUNTER — Encounter: Payer: Self-pay | Admitting: Emergency Medicine

## 2015-09-11 ENCOUNTER — Emergency Department: Payer: BLUE CROSS/BLUE SHIELD

## 2015-09-11 DIAGNOSIS — J189 Pneumonia, unspecified organism: Secondary | ICD-10-CM

## 2015-09-11 DIAGNOSIS — J159 Unspecified bacterial pneumonia: Secondary | ICD-10-CM | POA: Diagnosis not present

## 2015-09-11 DIAGNOSIS — Z880 Allergy status to penicillin: Secondary | ICD-10-CM | POA: Insufficient documentation

## 2015-09-11 DIAGNOSIS — F17210 Nicotine dependence, cigarettes, uncomplicated: Secondary | ICD-10-CM | POA: Diagnosis not present

## 2015-09-11 DIAGNOSIS — R05 Cough: Secondary | ICD-10-CM | POA: Diagnosis present

## 2015-09-11 DIAGNOSIS — J040 Acute laryngitis: Secondary | ICD-10-CM | POA: Diagnosis not present

## 2015-09-11 DIAGNOSIS — I10 Essential (primary) hypertension: Secondary | ICD-10-CM | POA: Diagnosis not present

## 2015-09-11 DIAGNOSIS — Z79899 Other long term (current) drug therapy: Secondary | ICD-10-CM | POA: Insufficient documentation

## 2015-09-11 MED ORDER — AZITHROMYCIN 250 MG PO TABS: ORAL_TABLET | ORAL | Status: DC

## 2015-09-11 MED ORDER — GUAIFENESIN-CODEINE 100-10 MG/5ML PO SOLN: 5.0000 mL | ORAL | Status: DC | PRN

## 2015-09-11 NOTE — Discharge Instructions (Signed)
Community-Acquired Pneumonia, Adult Pneumonia is an infection of the lungs. One type of pneumonia can happen while a person is in a hospital. A different type can happen when a person is not in a hospital (community-acquired pneumonia). It is easy for this kind to spread from person to person. It can spread to you if you breathe near an infected person who coughs or sneezes. Some symptoms include:  A dry cough.  A wet (productive) cough.  Fever.  Sweating.  Chest pain. HOME CARE  Take over-the-counter and prescription medicines only as told by your doctor.  Only take cough medicine if you are losing sleep.  If you were prescribed an antibiotic medicine, take it as told by your doctor. Do not stop taking the antibiotic even if you start to feel better.  Sleep with your head and neck raised (elevated). You can do this by putting a few pillows under your head, or you can sleep in a recliner.  Do not use tobacco products. These include cigarettes, chewing tobacco, and e-cigarettes. If you need help quitting, ask your doctor.  Drink enough water to keep your pee (urine) clear or pale yellow. A shot (vaccine) can help prevent pneumonia. Shots are often suggested for:  People older than 34 years of age.  People older than 34 years of age:  Who are having cancer treatment.  Who have long-term (chronic) lung disease.  Who have problems with their body's defense system (immune system). You may also prevent pneumonia if you take these actions:  Get the flu (influenza) shot every year.  Go to the dentist as often as told.  Wash your hands often. If soap and water are not available, use hand sanitizer. GET HELP IF:  You have a fever.  You lose sleep because your cough medicine does not help. GET HELP RIGHT AWAY IF:  You are short of breath and it gets worse.  You have more chest pain.  Your sickness gets worse. This is very serious if:  You are an older adult.  Your  body's defense system is weak.  You cough up blood.   This information is not intended to replace advice given to you by your health care provider. Make sure you discuss any questions you have with your health care provider.   Document Released: 01/06/2008 Document Revised: 04/10/2015 Document Reviewed: 11/14/2014 Elsevier Interactive Patient Education [employer]   Follow-up as Dr. Dan Humphreys if any continued problems. Begin medication today as directed. Increase fluids. Decrease smoking.

## 2015-09-11 NOTE — ED Provider Notes (Signed)
[facility] Emergency Department Provider Note  ____________________________________________  Time seen: Approximately 8:35 AM  I have reviewed the triage vital signs and the nursing notes.   HISTORY  Chief Complaint Cough; Generalized Body Aches; and Fever   HPI Chelsea Nolan is a 35 y.o. female is here complaining of productive cough, chills, body aches and fever for 4 days. Patient has been taking Alka-Seltzer plus cold at home without any improvement. Patient's is an active smoker but has not been able to smoke since she has been sick. She denies any nausea or vomiting.She also has "lost her voice". She continues to take Claritin daily also without any improvement. Temperature has been as high as 102. Patient has had a history of pneumonia in the past. Patient rates her pain as 7 out of 10.   Past Medical History  Diagnosis Date  . Arthritis   . Hypertension   . Psoriatic arthritis [facility])     Dermatologist - Dr. Adolphus Birchwood, Rheumatologist - Dr. Gavin Potters  . Obesity   . Rheumatoid arthritis (HCC)   . Kidney stones     Patient Active Problem List   Diagnosis Date Noted  . Migraine with aura 01/16/2015  . Neck pain 01/10/2015  . Renal stones 05/17/2014  . PTSD (post-traumatic stress disorder) 04/02/2014  . Menorrhagia 11/15/2013  . Family history of ovarian cancer 11/15/2013  . Muscle spasm 06/12/2013  . Tobacco abuse 03/08/2013  . Psoriatic arthritis (HCC) 01/26/2013  . Essential hypertension, benign 01/26/2013  . Obesity, unspecified 01/26/2013    Past Surgical History  Procedure Laterality Date  . Vaginal delivery    . Cesarean section      pre-eclampsia  . Lithotripsy      Current Outpatient Rx  Name  Route  Sig  Dispense  Refill  . azithromycin (ZITHROMAX Z-PAK) 250 MG tablet      Take 2 tablets (500 mg) on  Day 1,  followed by 1 tablet (250 mg) once daily on Days 2 through 5.   6 each   0   . butalbital-acetaminophen-caffeine  (FIORICET, ESGIC) 50-325-40 MG tablet   Oral   Take 1 tablet by mouth 2 (two) times daily as needed for headache.   60 tablet   1   . Cholecalciferol (VITAMIN D) 2000 UNITS CAPS   Oral   Take 1 capsule by mouth daily.          . cyanocobalamin (,VITAMIN B-12,) 1000 MCG/ML injection   Subcutaneous   Inject 1,000 mcg into the skin once a week.         Marland Kitchen guaiFENesin-codeine 100-10 MG/5ML syrup   Oral   Take 5 mLs by mouth every 4 (four) hours as needed for cough.   120 mL   0   . loratadine (CLARITIN) 10 MG tablet   Oral   Take 10 mg by mouth daily as needed for allergies.          . Methotrexate, PF, 25 MG/0.5ML SOAJ               . prazosin (MINIPRESS) 2 MG capsule   Oral   Take 2-4 mg by mouth at bedtime. Pt takes 1 to 2 capsules by mouth at bedtime.         . varenicline (CHANTIX STARTING MONTH PAK) 0.5 MG X 11 & 1 MG X 42 tablet      Take one 0.5 mg tablet by mouth once daily for 3 days, then increase to  one 0.5 mg tablet twice daily for 4 days, then increase to one 1 mg tablet twice daily.   53 tablet   0     Allergies Penicillins; Enbrel; Flexeril; Prednisone; Sertraline; Topamax; Amoxicillin; and Zofran  Family History  Problem Relation Age of Onset  . Diabetes Mother   . Hyperlipidemia Mother   . Hypertension Mother   . Arthritis Mother   . Lupus Mother   . Obesity Sister   . Cancer Maternal Grandmother     ovarian    Social History Social History  Substance Use Topics  . Smoking status: Current Every Day Smoker -- 0.50 packs/day for 15 years    Types: Cigarettes  . Smokeless tobacco: Never Used  . Alcohol Use: No    Review of Systems Constitutional: Positive fever/chills Eyes: No visual changes. ENT: No sore throat. Positive laryngitis Cardiovascular: Denies chest pain. Respiratory: Denies shortness of breath. Positive reactive cough Gastrointestinal:   No nausea, no vomiting.  No diarrhea.  Genitourinary: Negative for  dysuria. Musculoskeletal: Negative for back pain. Generalized body aches. Skin: Negative for rash. Neurological: Negative for headaches, focal weakness or numbness.  10-point ROS otherwise negative.  ____________________________________________   PHYSICAL EXAM:  VITAL SIGNS: ED Triage Vitals  Enc Vitals Group     BP 09/11/15 0811 160/109 mmHg     Pulse Rate 09/11/15 0811 114     Resp 09/11/15 0811 18     Temp 09/11/15 0811 98.4 F (36.9 C)     Temp Source 09/11/15 0811 Oral     SpO2 09/11/15 0811 98 %     Weight 09/11/15 0811 189 lb (85.73 kg)     Height 09/11/15 0811 5\' 2"  (1.575 m)     Head Cir --      Peak Flow --      Pain Score 09/11/15 0816 7     Pain Loc --      Pain Edu? --      Excl. in GC? --     Constitutional: Alert and oriented. Well appearing and in no acute distress. Eyes: Conjunctivae are normal. PERRL. EOMI. Head: Atraumatic. Nose: Mild congestion/no rhinnorhea. Mouth/Throat: Mucous membranes are moist.  Oropharynx non-erythematous. Neck: No stridor.   Hematological/Lymphatic/Immunilogical: No cervical lymphadenopathy. Cardiovascular: Normal rate, regular rhythm. Grossly normal heart sounds.  Good peripheral circulation. Respiratory: Poor respiratory effort with poor air exchange. No wheezes were noted. Patient is able to speak in complete sentences without any difficulty. Gastrointestinal: Soft and nontender. No distention.  Musculoskeletal: Moves upper and lower extremities without any difficulty and normal gait was noted while in the emergency room. Neurologic:  Normal speech and language. No gross focal neurologic deficits are appreciated. No gait instability. Skin:  Skin is warm, dry and intact. No rash noted. Psychiatric: Mood and affect are normal. Speech and behavior are normal.  ____________________________________________   LABS (all labs ordered are listed, but only abnormal results are displayed)  Labs Reviewed - No data to  display   RADIOLOGY  Chest x-ray per radiologist shows mild infiltrate  right lung base with pneumonia not completely excluded. ____________________________________________   PROCEDURES  Procedure(s) performed: None  Critical Care performed: No  ____________________________________________   INITIAL IMPRESSION / ASSESSMENT AND PLAN / ED COURSE  Pertinent labs & imaging results that were available during my care of the patient were reviewed by me and considered in my medical decision making (see chart for details).  She was placed on Zithromax pack. He is also given a  prescription for Robitussin before meals as needed for cough. She is to follow-up with Dr. Dan Humphreys if any continued problems. She is also encouraged to discontinue smoking. ____________________________________________   FINAL CLINICAL IMPRESSION(S) / ED DIAGNOSES  Final diagnoses:  Community acquired pneumonia      Tommi Rumps, PA-C 09/11/15 1146  Minna Antis, MD 09/11/15 2257

## 2015-09-11 NOTE — ED Notes (Signed)
Pt to ed with c/o cough, chills, bodyaches, fever and weakness since Sat.

## 2015-09-23 ENCOUNTER — Encounter: Payer: Self-pay | Admitting: Internal Medicine

## 2015-09-23 ENCOUNTER — Other Ambulatory Visit: Payer: Self-pay

## 2015-09-23 MED ORDER — BUTALBITAL-APAP-CAFFEINE 50-325-40 MG PO TABS: 1.0000 | ORAL_TABLET | Freq: Two times a day (BID) | ORAL | Status: DC | PRN

## 2016-02-05 ENCOUNTER — Encounter: Payer: Self-pay | Admitting: Emergency Medicine

## 2016-02-05 ENCOUNTER — Emergency Department: Payer: Self-pay

## 2016-02-05 ENCOUNTER — Observation Stay
Admission: EM | Admit: 2016-02-05 | Discharge: 2016-02-06 | Disposition: A | Discharge status: Home / Self Care | Payer: Self-pay | Attending: Internal Medicine | Admitting: Internal Medicine

## 2016-02-05 DIAGNOSIS — Z791 Long term (current) use of non-steroidal anti-inflammatories (NSAID): Secondary | ICD-10-CM | POA: Insufficient documentation

## 2016-02-05 DIAGNOSIS — Z9889 Other specified postprocedural states: Secondary | ICD-10-CM | POA: Insufficient documentation

## 2016-02-05 DIAGNOSIS — Z881 Allergy status to other antibiotic agents status: Secondary | ICD-10-CM | POA: Insufficient documentation

## 2016-02-05 DIAGNOSIS — R509 Fever, unspecified: Secondary | ICD-10-CM

## 2016-02-05 DIAGNOSIS — J9811 Atelectasis: Secondary | ICD-10-CM | POA: Insufficient documentation

## 2016-02-05 DIAGNOSIS — Z888 Allergy status to other drugs, medicaments and biological substances status: Secondary | ICD-10-CM | POA: Insufficient documentation

## 2016-02-05 DIAGNOSIS — K760 Fatty (change of) liver, not elsewhere classified: Secondary | ICD-10-CM | POA: Insufficient documentation

## 2016-02-05 DIAGNOSIS — R05 Cough: Secondary | ICD-10-CM

## 2016-02-05 DIAGNOSIS — F17210 Nicotine dependence, cigarettes, uncomplicated: Secondary | ICD-10-CM | POA: Insufficient documentation

## 2016-02-05 DIAGNOSIS — Z79899 Other long term (current) drug therapy: Secondary | ICD-10-CM | POA: Insufficient documentation

## 2016-02-05 DIAGNOSIS — R059 Cough, unspecified: Secondary | ICD-10-CM

## 2016-02-05 DIAGNOSIS — Z8261 Family history of arthritis: Secondary | ICD-10-CM | POA: Insufficient documentation

## 2016-02-05 DIAGNOSIS — R778 Other specified abnormalities of plasma proteins: Secondary | ICD-10-CM

## 2016-02-05 DIAGNOSIS — L4050 Arthropathic psoriasis, unspecified: Secondary | ICD-10-CM | POA: Insufficient documentation

## 2016-02-05 DIAGNOSIS — M069 Rheumatoid arthritis, unspecified: Secondary | ICD-10-CM | POA: Insufficient documentation

## 2016-02-05 DIAGNOSIS — Z8249 Family history of ischemic heart disease and other diseases of the circulatory system: Secondary | ICD-10-CM | POA: Insufficient documentation

## 2016-02-05 DIAGNOSIS — Z6837 Body mass index (BMI) 37.0-37.9, adult: Secondary | ICD-10-CM | POA: Insufficient documentation

## 2016-02-05 DIAGNOSIS — Z8269 Family history of other diseases of the musculoskeletal system and connective tissue: Secondary | ICD-10-CM | POA: Insufficient documentation

## 2016-02-05 DIAGNOSIS — R748 Abnormal levels of other serum enzymes: Secondary | ICD-10-CM | POA: Insufficient documentation

## 2016-02-05 DIAGNOSIS — I10 Essential (primary) hypertension: Secondary | ICD-10-CM | POA: Insufficient documentation

## 2016-02-05 DIAGNOSIS — Z833 Family history of diabetes mellitus: Secondary | ICD-10-CM | POA: Insufficient documentation

## 2016-02-05 DIAGNOSIS — R079 Chest pain, unspecified: Secondary | ICD-10-CM | POA: Diagnosis present

## 2016-02-05 DIAGNOSIS — Z880 Allergy status to penicillin: Secondary | ICD-10-CM | POA: Insufficient documentation

## 2016-02-05 DIAGNOSIS — Z87442 Personal history of urinary calculi: Secondary | ICD-10-CM | POA: Insufficient documentation

## 2016-02-05 DIAGNOSIS — J209 Acute bronchitis, unspecified: Principal | ICD-10-CM | POA: Insufficient documentation

## 2016-02-05 DIAGNOSIS — Z79891 Long term (current) use of opiate analgesic: Secondary | ICD-10-CM | POA: Insufficient documentation

## 2016-02-05 DIAGNOSIS — Z8041 Family history of malignant neoplasm of ovary: Secondary | ICD-10-CM | POA: Insufficient documentation

## 2016-02-05 DIAGNOSIS — R7989 Other specified abnormal findings of blood chemistry: Secondary | ICD-10-CM

## 2016-02-05 LAB — CBC
HEMATOCRIT: 46.4 % (ref 35.0–47.0)
Hemoglobin: 16.1 g/dL — ABNORMAL HIGH (ref 12.0–16.0)
MCH: 33.2 pg (ref 26.0–34.0)
MCHC: 34.6 g/dL (ref 32.0–36.0)
MCV: 96 fL (ref 80.0–100.0)
Platelets: 288 10*3/uL (ref 150–440)
RBC: 4.83 MIL/uL (ref 3.80–5.20)
RDW: 14.6 % — AB (ref 11.5–14.5)
WBC: 16.4 10*3/uL — ABNORMAL HIGH (ref 3.6–11.0)

## 2016-02-05 LAB — TROPONIN I: Troponin I: 0.06 ng/mL (ref <0.03)

## 2016-02-05 LAB — BASIC METABOLIC PANEL
Anion gap: 11 (ref 5–15)
BUN: 10 mg/dL (ref 6–20)
CALCIUM: 9.3 mg/dL (ref 8.9–10.3)
CO2: 23 mmol/L (ref 22–32)
Chloride: 101 mmol/L (ref 101–111)
Creatinine, Ser: 0.66 mg/dL (ref 0.44–1.00)
GFR calc Af Amer: >60 mL/min (ref >60)
GLUCOSE: 108 mg/dL — AB (ref 65–99)
POTASSIUM: 3.5 mmol/L (ref 3.5–5.1)
Sodium: 135 mmol/L (ref 135–145)

## 2016-02-05 MED ORDER — MORPHINE SULFATE (PF) 2 MG/ML IV SOLN
2.0000 mg | INTRAVENOUS | Status: DC | PRN
  Administered 2016-02-05 – 2016-02-06 (×3): 2 mg via INTRAVENOUS
  Filled 2016-02-05 (×3): qty 1

## 2016-02-05 MED ORDER — ACETAMINOPHEN 325 MG PO TABS
650.0000 mg | ORAL_TABLET | Freq: Four times a day (QID) | ORAL | Status: DC | PRN
  Filled 2016-02-05: qty 2

## 2016-02-05 MED ORDER — CYANOCOBALAMIN 1000 MCG/ML IJ SOLN: 1000.0000 ug | INTRAMUSCULAR | Status: DC

## 2016-02-05 MED ORDER — OXYCODONE HCL 5 MG PO TABS
5.0000 mg | ORAL_TABLET | ORAL | Status: DC | PRN
  Administered 2016-02-05 – 2016-02-06 (×3): 5 mg via ORAL
  Filled 2016-02-05 (×3): qty 1

## 2016-02-05 MED ORDER — ASPIRIN 81 MG PO CHEW
324.0000 mg | CHEWABLE_TABLET | Freq: Once | ORAL | Status: AC
  Administered 2016-02-05: 324 mg via ORAL

## 2016-02-05 MED ORDER — IPRATROPIUM-ALBUTEROL 0.5-2.5 (3) MG/3ML IN SOLN
3.0000 mL | Freq: Once | RESPIRATORY_TRACT | Status: AC
  Administered 2016-02-05: 3 mL via RESPIRATORY_TRACT
  Filled 2016-02-05 (×2): qty 3

## 2016-02-05 MED ORDER — ALPRAZOLAM 1 MG PO TABS
2.0000 mg | ORAL_TABLET | Freq: Once | ORAL | Status: AC
  Administered 2016-02-05: 2 mg via ORAL
  Filled 2016-02-05: qty 2

## 2016-02-05 MED ORDER — ASPIRIN 81 MG PO CHEW
CHEWABLE_TABLET | ORAL | Status: AC
  Administered 2016-02-05: 324 mg via ORAL
  Filled 2016-02-05: qty 4

## 2016-02-05 MED ORDER — SODIUM CHLORIDE 0.9% FLUSH
3.0000 mL | Freq: Two times a day (BID) | INTRAVENOUS | Status: DC
  Administered 2016-02-05 – 2016-02-06 (×2): 3 mL via INTRAVENOUS

## 2016-02-05 MED ORDER — IOPAMIDOL (ISOVUE-370) INJECTION 76%
100.0000 mL | Freq: Once | INTRAVENOUS | Status: AC | PRN
  Administered 2016-02-05: 100 mL via INTRAVENOUS
  Filled 2016-02-05: qty 100

## 2016-02-05 MED ORDER — VITAMIN D 1000 UNITS PO TABS
2000.0000 [IU] | ORAL_TABLET | Freq: Every day | ORAL | Status: DC
  Administered 2016-02-06: 2000 [IU] via ORAL
  Filled 2016-02-05: qty 2

## 2016-02-05 MED ORDER — IBUPROFEN 400 MG PO TABS: 600.0000 mg | ORAL_TABLET | Freq: Four times a day (QID) | ORAL | Status: DC | PRN

## 2016-02-05 MED ORDER — ENOXAPARIN SODIUM 40 MG/0.4ML ~~LOC~~ SOLN
40.0000 mg | SUBCUTANEOUS | Status: DC
  Administered 2016-02-05: 40 mg via SUBCUTANEOUS

## 2016-02-05 MED ORDER — GUAIFENESIN-CODEINE 100-10 MG/5ML PO SOLN
5.0000 mL | ORAL | Status: DC | PRN
  Administered 2016-02-05 – 2016-02-06 (×3): 5 mL via ORAL
  Filled 2016-02-05 (×3): qty 5

## 2016-02-05 MED ORDER — ENOXAPARIN SODIUM 40 MG/0.4ML ~~LOC~~ SOLN
SUBCUTANEOUS | Status: AC
  Administered 2016-02-05: 40 mg via SUBCUTANEOUS
  Filled 2016-02-05: qty 0.4

## 2016-02-05 MED ORDER — PRAZOSIN HCL 2 MG PO CAPS
2.0000 mg | ORAL_CAPSULE | Freq: Every day | ORAL | Status: DC
  Administered 2016-02-05: 4 mg via ORAL
  Filled 2016-02-05: qty 2

## 2016-02-05 MED ORDER — LORATADINE 10 MG PO TABS: 10.0000 mg | ORAL_TABLET | Freq: Every day | ORAL | Status: DC | PRN

## 2016-02-05 MED ORDER — ACETAMINOPHEN 650 MG RE SUPP
650.0000 mg | Freq: Four times a day (QID) | RECTAL | Status: DC | PRN
  Filled 2016-02-05: qty 1

## 2016-02-05 MED ORDER — ENOXAPARIN SODIUM 100 MG/ML ~~LOC~~ SOLN: 1.0000 mg/kg | Freq: Once | SUBCUTANEOUS | Status: DC

## 2016-02-05 MED ORDER — LEVOFLOXACIN 750 MG PO TABS
750.0000 mg | ORAL_TABLET | Freq: Once | ORAL | Status: AC
  Administered 2016-02-05: 750 mg via ORAL
  Filled 2016-02-05: qty 1

## 2016-02-05 NOTE — Progress Notes (Signed)
Pt takes Xanax 2mg  four times a day at home, admitting MD did not transfer that prescription over while here at the hospital. Pt needs that while she is here. MD paged, Dr. wants to give it as a one time dose tonight & then let the day shift attending assess it from there. No other concerns, will continue to monitor. Joneen Roach, RN

## 2016-02-05 NOTE — ED Provider Notes (Signed)
[facility] Emergency Department Provider Note        Time seen: ----------------------------------------- 1:16 PM on 02/05/2016 -----------------------------------------    I have reviewed the triage vital signs and the nursing notes.   HISTORY  Chief Complaint Shortness of Breath and Cough    HPI Chelsea Nolan is a 35 y.o. female who presents to ER for cough, chest pain and shortness of breath. Patient states she had a fever last night to 100.1. She reports taking ibuprofen and Alka-Seltzer cold and flu medicine. Patient was seen here diagnosed with pneumonia 4 months ago, states this feels worse. She states she started with sinus and upper respiratory symptoms 2 days ago.   Past Medical History  Diagnosis Date  . Arthritis   . Hypertension   . Psoriatic arthritis [facility])     Dermatologist - Dr. Adolphus Birchwood, Rheumatologist - Dr. Gavin Potters  . Obesity   . Rheumatoid arthritis (HCC)   . Kidney stones     Patient Active Problem List   Diagnosis Date Noted  . Migraine with aura 01/16/2015  . Neck pain 01/10/2015  . Renal stones 05/17/2014  . PTSD (post-traumatic stress disorder) 04/02/2014  . Menorrhagia 11/15/2013  . Family history of ovarian cancer 11/15/2013  . Muscle spasm 06/12/2013  . Tobacco abuse 03/08/2013  . Psoriatic arthritis (HCC) 01/26/2013  . Essential hypertension, benign 01/26/2013  . Obesity, unspecified 01/26/2013    Past Surgical History  Procedure Laterality Date  . Vaginal delivery    . Cesarean section      pre-eclampsia  . Lithotripsy      Allergies Penicillins; Enbrel; Flexeril; Prednisone; Sertraline; Topamax; Amoxicillin; and Zofran  Social History Social History  Substance Use Topics  . Smoking status: Current Every Day Smoker -- 0.50 packs/day for 15 years    Types: Cigarettes  . Smokeless tobacco: Never Used  . Alcohol Use: No    Review of Systems Constitutional: Positive for fever Eyes: Negative for  visual changes. ENT: Negative for sore throat. Cardiovascular: Negative for chest pain. Respiratory: Positive for cough and shortness of breath Gastrointestinal: Negative for abdominal pain, vomiting and diarrhea. Genitourinary: Negative for dysuria. Musculoskeletal: Negative for back pain. Skin: Negative for rash. Neurological: Negative for headaches, focal weakness or numbness.  10-point ROS otherwise negative.  ____________________________________________   PHYSICAL EXAM:  VITAL SIGNS: ED Triage Vitals  Enc Vitals Group     BP 02/05/16 1246 161/105 mmHg     Pulse Rate 02/05/16 1246 108     Resp 02/05/16 1246 22     Temp 02/05/16 1246 97.8 F (36.6 C)     Temp Source 02/05/16 1246 Oral     SpO2 02/05/16 1246 99 %     Weight 02/05/16 1246 189 lb (85.73 kg)     Height 02/05/16 1246 5' 1.5" (1.562 m)     Head Cir --      Peak Flow --      Pain Score 02/05/16 1247 8     Pain Loc --      Pain Edu? --      Excl. in GC? --     Constitutional: Alert and oriented. Anxious, no acute distress Eyes: Conjunctivae are normal. PERRL. Normal extraocular movements. ENT   Head: Normocephalic and atraumatic.   Nose: No congestion/rhinnorhea.   Mouth/Throat: Mucous membranes are moist.   Neck: No stridor. Cardiovascular: Rapid rate, regular rhythm. No murmurs, rubs, or gallops. Respiratory: Tachypnea with clear breath sounds. Gastrointestinal: Soft and nontender. Normal bowel  sounds Musculoskeletal: Nontender with normal range of motion in all extremities. No lower extremity tenderness nor edema. Neurologic:  Normal speech and language. No gross focal neurologic deficits are appreciated.  Skin:  Skin is warm, dry and intact. No rash noted. Psychiatric: Anxious mood and affect ____________________________________________  EKG: Interpreted by me.Sinus rhythm with a rate of 95 bpm, normal PR interval, normal QRS, normal QT interval. Normal axis. No evidence of acute  infarction  ____________________________________________  ED COURSE:  Pertinent labs & imaging results that were available during my care of the patient were reviewed by me and considered in my medical decision making (see chart for details). Patient persists to ER with dyspnea, cough and fever. I will check basic labs and imaging. ____________________________________________    LABS (pertinent positives/negatives)  Labs Reviewed  BASIC METABOLIC PANEL - Abnormal; Notable for the following:    Glucose, Bld 108 (*)    All other components within normal limits  CBC - Abnormal; Notable for the following:    WBC 16.4 (*)    Hemoglobin 16.1 (*)    RDW 14.6 (*)    All other components within normal limits  TROPONIN I - Abnormal; Notable for the following:    Troponin I 0.06 (*)    All other components within normal limits    RADIOLOGY Images were viewed by me  Chest x-ray FINDINGS: There is no edema or consolidation. The heart size and pulmonary vascularity are normal. No adenopathy. No pneumothorax. No bone lesions.  IMPRESSION: No edema or consolidation. IMPRESSION: Negative for pulmonary embolus or finding to explain the patient's symptoms.  Fatty infiltration of the liver.  ____________________________________________  FINAL ASSESSMENT AND PLAN  Cough, fever, dyspnea, elevated troponin  Plan: Patient with labs and imaging as dictated above. No clear etiology for her elevated troponin and symptoms. Possible pericarditis or myocarditis. It is possible this is just demand related troponin elevation from fever and cough. I'll recommend observation with serial troponins, possible echocardiogram   Emily Filbert, MD   Note: This dictation was prepared with Dragon dictation. Any transcriptional errors that result from this process are unintentional   Emily Filbert, MD 02/05/16 1600

## 2016-02-05 NOTE — H&P (Signed)
[facility] [place] [facility]   PATIENT NAME: Chelsea Nolan    MR#:  [id number]  DATE OF BIRTH:  June 29, 1981   DATE OF ADMISSION:  02/05/2016  PRIMARY CARE PHYSICIAN: Wynona Dove, MD   REQUESTING/REFERRING PHYSICIAN: Mayford Knife  CHIEF COMPLAINT:   Chief Complaint  Patient presents with  . Shortness of Breath  . Cough    HISTORY OF PRESENT ILLNESS:  Chelsea Nolan  is a 35 y.o. female with a known history of Psoriatic arthritis on methotrexate who is presenting with chest pain. She describes three-day duration of upper respiratory symptoms now one day duration of nonproductive cough with chest pain described as sharp under the left breast in location and nonradiating worse with cough no relieving factors mild shortness of breath also positive for subjective fevers and chills  PAST MEDICAL HISTORY:   Past Medical History  Diagnosis Date  . Arthritis   . Hypertension   . Psoriatic arthritis [facility])     Dermatologist - Dr. Adolphus Birchwood, Rheumatologist - Dr. Gavin Potters  . Obesity   . Rheumatoid arthritis (HCC)   . Kidney stones     PAST SURGICAL HISTORY:   Past Surgical History  Procedure Laterality Date  . Vaginal delivery    . Cesarean section      pre-eclampsia  . Lithotripsy      SOCIAL HISTORY:   Social History  Substance Use Topics  . Smoking status: Current Every Day Smoker -- 0.50 packs/day for 15 years    Types: Cigarettes  . Smokeless tobacco: Never Used  . Alcohol Use: No    FAMILY HISTORY:   Family History  Problem Relation Age of Onset  . Diabetes Mother   . Hyperlipidemia Mother   . Hypertension Mother   . Arthritis Mother   . Lupus Mother   . Obesity Sister   . Cancer Maternal Grandmother     ovarian    DRUG ALLERGIES:   Allergies  Allergen Reactions  . Penicillins Rash  . Enbrel [Etanercept] Other (See Comments)    Reaction:  Abdominal pain  . Flexeril [Cyclobenzaprine]   . Prednisone Nausea Only  . Sertraline  Other (See Comments)    Reaction:  Suicidal ideation  . Topamax [Topiramate] Other (See Comments)    Reaction:  Tingling   . Amoxicillin Rash  . Zofran [Ondansetron Hcl] Rash and Other (See Comments)    Reaction:  Headache    REVIEW OF SYSTEMS:  REVIEW OF SYSTEMS:  CONSTITUTIONAL: Positive fevers, chills, denies fatigue, weakness.  EYES: Denies blurred vision, double vision, or eye pain.  EARS, NOSE, THROAT: Denies tinnitus, ear pain, hearing loss.  RESPIRATORY: Positive cough, shortness of breath, denies wheezing  CARDIOVASCULAR: Positive chest pain, denies palpitations, edema.  GASTROINTESTINAL: Denies nausea, vomiting, diarrhea, abdominal pain.  GENITOURINARY: Denies dysuria, hematuria.  ENDOCRINE: Denies nocturia or thyroid problems. HEMATOLOGIC AND LYMPHATIC: Denies easy bruising or bleeding.  SKIN: Denies rash or lesions.  MUSCULOSKELETAL: Denies pain in neck, back, shoulder, knees, hips, or further arthritic symptoms.  NEUROLOGIC: Denies paralysis, paresthesias.  PSYCHIATRIC: Denies anxiety or depressive symptoms. Otherwise full review of systems performed by me is negative.   MEDICATIONS AT HOME:   Prior to Admission medications   Medication Sig Start Date End Date Taking? Authorizing Provider  azithromycin (ZITHROMAX Z-PAK) 250 MG tablet Take 2 tablets (500 mg) on  Day 1,  followed by 1 tablet (250 mg) once daily on Days 2 through 5. 09/11/15   Tommi Rumps, PA-C  butalbital-acetaminophen-caffeine (FIORICET, ESGIC) 50-325-40 MG tablet Take 1 tablet by mouth 2 (two) times daily as needed for headache. 09/23/15   Shelia Media, MD  Cholecalciferol (VITAMIN D) 2000 UNITS CAPS Take 1 capsule by mouth daily.     Historical Provider, MD  cyanocobalamin (,VITAMIN B-12,) 1000 MCG/ML injection Inject 1,000 mcg into the skin once a week.    Historical Provider, MD  guaiFENesin-codeine 100-10 MG/5ML syrup Take 5 mLs by mouth every 4 (four) hours as needed for cough. 09/11/15    Tommi Rumps, PA-C  loratadine (CLARITIN) 10 MG tablet Take 10 mg by mouth daily as needed for allergies.     Historical Provider, MD  Methotrexate, PF, 25 MG/0.5ML SOAJ  12/21/14   Historical Provider, MD  prazosin (MINIPRESS) 2 MG capsule Take 2-4 mg by mouth at bedtime. Pt takes 1 to 2 capsules by mouth at bedtime.    Historical Provider, MD  varenicline (CHANTIX STARTING MONTH PAK) 0.5 MG X 11 & 1 MG X 42 tablet Take one 0.5 mg tablet by mouth once daily for 3 days, then increase to one 0.5 mg tablet twice daily for 4 days, then increase to one 1 mg tablet twice daily. 03/21/15   Shelia Media, MD      VITAL SIGNS:  Blood pressure 133/99, pulse 87, temperature 97.8 F (36.6 C), temperature source Oral, resp. rate 18, height 5' 1.5" (1.562 m), weight 189 lb (85.73 kg), last menstrual period 02/06/2015, SpO2 95 %.  PHYSICAL EXAMINATION:  VITAL SIGNS: Filed Vitals:   02/05/16 1246 02/05/16 1513  BP: 161/105 133/99  Pulse: 108 87  Temp: 97.8 F (36.6 C)   Resp: 22 18   GENERAL:35 y.o.female currently in no acute distress.  HEAD: Normocephalic, atraumatic.  EYES: Pupils equal, round, reactive to light. Extraocular muscles intact. No scleral icterus.  MOUTH: Moist mucosal membrane. Dentition intact. No abscess noted.  EAR, NOSE, THROAT: Clear without exudates. No external lesions.  NECK: Supple. No thyromegaly. No nodules. No JVD.  PULMONARY: Clear to ascultation, without wheeze rails or rhonci. No use of accessory muscles, Good respiratory effort. good air entry bilaterally CHEST: Nontender to palpation.  CARDIOVASCULAR: S1 and S2. Regular rate and rhythm. No murmurs, rubs, or gallops. No edema. Pedal pulses 2+ bilaterally.  GASTROINTESTINAL: Soft, nontender, nondistended. No masses. Positive bowel sounds. No hepatosplenomegaly.  MUSCULOSKELETAL: No swelling, clubbing, or edema. Range of motion full in all extremities.  NEUROLOGIC: Cranial nerves II through XII are intact. No  gross focal neurological deficits. Sensation intact. Reflexes intact.  SKIN:Psoriatic plaque extensor surface upper extremities otherwise No ulceration, lesions, rashes, or cyanosis. Skin warm and dry. Turgor intact.  PSYCHIATRIC: Mood, affect within normal limits. The patient is awake, alert and oriented x 3. Insight, judgment intact.    LABORATORY PANEL:   CBC  Recent Labs Lab 02/05/16 1305  WBC 16.4*  HGB 16.1*  HCT 46.4  PLT 288   ------------------------------------------------------------------------------------------------------------------  Chemistries   Recent Labs Lab 02/05/16 1305  NA 135  K 3.5  CL 101  CO2 23  GLUCOSE 108*  BUN 10  CREATININE 0.66  CALCIUM 9.3   ------------------------------------------------------------------------------------------------------------------  Cardiac Enzymes  Recent Labs Lab 02/05/16 1305  TROPONINI 0.06*   ------------------------------------------------------------------------------------------------------------------  RADIOLOGY:  Dg Chest 2 View  02/05/2016  CLINICAL DATA:  Cough and chest pain ; shortness of breath EXAM: CHEST  2 VIEW COMPARISON:  September 11, 2015 FINDINGS: There is no edema or consolidation. The heart size and pulmonary vascularity are  normal. No adenopathy. No pneumothorax. No bone lesions. IMPRESSION: No edema or consolidation. Electronically Signed   By: Bretta Bang III M.D.   On: 02/05/2016 13:43   Ct Angio Chest Pe W/cm &/or Wo Cm  02/05/2016  CLINICAL DATA:  Cough, chest pain and shortness of breath for 2-1/2 days. Fever. EXAM: CT ANGIOGRAPHY CHEST WITH CONTRAST TECHNIQUE: Multidetector CT imaging of the chest was performed using the standard protocol during bolus administration of intravenous contrast. Multiplanar CT image reconstructions and MIPs were obtained to evaluate the vascular anatomy. CONTRAST:  100 cc Isovue 370. COMPARISON:  PA and lateral chest earlier today. Single view of  the chest 09/11/2015. FINDINGS: No pulmonary embolus is identified. There is no axillary, hilar or mediastinal lymphadenopathy. No pleural or pericardial effusion. Heart size is normal. Mild dependent atelectasis is seen. The lungs are otherwise clear. Visualized upper abdomen shows fatty infiltration of the liver. No bony abnormality is seen. Review of the MIP images confirms the above findings. IMPRESSION: Negative for pulmonary embolus or finding to explain the patient's symptoms. Fatty infiltration of the liver. Electronically Signed   By: Drusilla Kanner M.D.   On: 02/05/2016 15:50    EKG:   Orders placed or performed during the hospital encounter of 02/05/16  . EKG 12-Lead  . EKG 12-Lead  . ED EKG within 10 minutes  . ED EKG within 10 minutes    IMPRESSION AND PLAN:   35 year old Caucasian female history of psoriatic arthritis presenting with chest pain and cough  1. Chest pain unspecified: Mild elevated troponin place on telemetry trend cardiac enzymes, will continue with her Levaquin as already received for suspected bronchitis    All the records are reviewed and case discussed with ED provider. Management plans discussed with the patient, family and they are in agreement.  CODE STATUS: Full  TOTAL TIME TAKING CARE OF THIS PATIENT: 33 minutes.    Hower,  Mardi Mainland.D on 02/05/2016 at 4:28 PM  Between 7am to 6pm - Pager - [phone]  After 6pm: House Pager: - [phone]  [facility] [place] [facility]  Office  [phone]  CC: Primary care physician; Wynona Dove, MD

## 2016-02-05 NOTE — ED Notes (Signed)
Pt presents to ED with reports of cough, chest pain under breasts and shortness of breath. Pt states she had a fever up to 100.1 last night. Pt reports taking ibuprofen and alka seltzer cold and flu medication.

## 2016-02-06 ENCOUNTER — Telehealth: Payer: Self-pay | Admitting: *Deleted

## 2016-02-06 LAB — TROPONIN I

## 2016-02-06 MED ORDER — GUAIFENESIN-CODEINE 100-10 MG/5ML PO SOLN
5.0000 mL | ORAL | Status: DC | PRN
Start: 2016-02-06 — End: 2016-02-06

## 2016-02-06 MED ORDER — NICOTINE 21 MG/24HR TD PT24: 21.0000 mg | MEDICATED_PATCH | Freq: Every day | TRANSDERMAL | Status: DC

## 2016-02-06 MED ORDER — LEVOFLOXACIN 500 MG PO TABS: 500.0000 mg | ORAL_TABLET | Freq: Every day | ORAL | Status: DC

## 2016-02-06 MED ORDER — IBUPROFEN 600 MG PO TABS: 600.0000 mg | ORAL_TABLET | Freq: Four times a day (QID) | ORAL | Status: DC | PRN

## 2016-02-06 MED ORDER — GUAIFENESIN-CODEINE 100-10 MG/5ML PO SOLN: 5.0000 mL | ORAL | Status: DC | PRN

## 2016-02-06 MED ORDER — LEVOFLOXACIN 500 MG PO TABS: 500.0000 mg | ORAL_TABLET | Freq: Every day | ORAL | Status: AC

## 2016-02-06 NOTE — Care Management (Signed)
Self pay patient. Patient states she is on workman's compensation. She pays for her medications out of pocket. She gets her methotrexate at [facility]. PCP is Ronna Polio. Patient states her MD is leaving the practice. List of accepting practices given. Provided patient with a medication management application. She lives at home with family who are supportive. She is independent and drives.

## 2016-02-06 NOTE — Telephone Encounter (Signed)
Pt will discharged today, from [facility]. She was admitted for chest pain

## 2016-02-06 NOTE — Discharge Instructions (Addendum)
DIET:  Regular diet  DISCHARGE CONDITION:  Stable  ACTIVITY:  Activity as tolerated  OXYGEN:  Home Oxygen: No.   Oxygen Delivery: room air  DISCHARGE LOCATION:  home    ADDITIONAL DISCHARGE INSTRUCTION:stop smoking   If you experience worsening of your admission symptoms, develop shortness of breath, life threatening emergency, suicidal or homicidal thoughts you must seek medical attention immediately by calling 911 or calling your MD immediately  if symptoms less severe.  You Must read complete instructions/literature along with all the possible adverse reactions/side effects for all the Medicines you take and that have been prescribed to you. Take any new Medicines after you have completely understood and accpet all the possible adverse reactions/side effects.   Please note  You were cared for by a hospitalist during your hospital stay. If you have any questions about your discharge medications or the care you received while you were in the hospital after you are discharged, you can call the unit and asked to speak with the hospitalist on call if the hospitalist that took care of you is not available. Once you are discharged, your primary care physician will handle any further medical issues. Please note that NO REFILLS for any discharge medications will be authorized once you are discharged, as it is imperative that you return to your primary care physician (or establish a relationship with a primary care physician if you do not have one) for your aftercare needs so that they can reassess your need for medications and monitor your lab values.   Aspirin and Your Heart  Aspirin is a medicine that affects the way blood clots. Aspirin can be used to help reduce the risk of blood clots, heart attacks, and other heart-related problems.  SHOULD I TAKE ASPIRIN? Your health care provider will help you determine whether it is safe and beneficial for you to take aspirin daily. Taking  aspirin daily may be beneficial if you:  Have had a heart attack or chest pain.  Have undergone open heart surgery such as coronary artery bypass surgery (CABG).  Have had coronary angioplasty.  Have experienced a stroke or transient ischemic attack (TIA).  Have peripheral vascular disease (PVD).  Have chronic heart rhythm problems such as atrial fibrillation. ARE THERE ANY RISKS OF TAKING ASPIRIN DAILY? Daily use of aspirin can increase your risk of side effects. Some of these include:  Bleeding. Bleeding problems can be minor or serious. An example of a minor problem is a cut that does not stop bleeding. An example of a more serious problem is stomach bleeding or bleeding into the brain. Your risk of bleeding is increased if you are also taking non-steroidal anti-inflammatory medicine (NSAIDs).  Increased bruising.  Upset stomach.  An allergic reaction. People who have nasal polyps have an increased risk of developing an aspirin allergy. WHAT ARE SOME GUIDELINES I SHOULD FOLLOW WHEN TAKING ASPIRIN?   Take aspirin only as directed by your health care provider. Make sure you understand how much you should take and what form you should take. The two forms of aspirin are:  Non-enteric-coated. This type of aspirin does not have a coating and is absorbed quickly. Non-enteric-coated aspirin is usually recommended for people with chest pain. This type of aspirin also comes in a chewable form.  Enteric-coated. This type of aspirin has a special coating that releases the medicine very slowly. Enteric-coated aspirin causes less stomach upset than non-enteric-coated aspirin. This type of aspirin should not be chewed or crushed.  Drink  alcohol in moderation. Drinking alcohol increases your risk of bleeding. WHEN SHOULD I SEEK MEDICAL CARE?   You have unusual bleeding or bruising.  You have stomach pain.  You have an allergic reaction. Symptoms of an allergic reaction  include:  Hives.  Itchy skin.  Swelling of the lips, tongue, or face.  You have ringing in your ears. WHEN SHOULD I SEEK IMMEDIATE MEDICAL CARE?   Your bowel movements are bloody, dark red, or black in color.  You vomit or cough up blood.  You have blood in your urine.  You cough, wheeze, or feel short of breath. If you have any of the following symptoms, this is an emergency. Do not wait to see if the pain will go away. Get medical help at once. Call your local emergency services (911 in the [place] Do not drive yourself to the hospital.  You have severe chest pain, especially if the pain is crushing or pressure-like and spreads to the arms, back, neck, or jaw.  You have stroke-like symptoms, such as:   Loss of vision.   Difficulty talking.   Numbness or weakness on one side of your body.   Numbness or weakness in your arm or leg.   Not thinking clearly or feeling confused.    This information is not intended to replace advice given to you by your health care provider. Make sure you discuss any questions you have with your health care provider.   Document Released: 07/02/2008 Document Revised: 08/10/2014 Document Reviewed: 10/25/2013 Elsevier Interactive Patient Education [employer]

## 2016-02-06 NOTE — Progress Notes (Signed)
Patient discharged with sister.  Case management arranged for Prescriptions to be filled.

## 2016-02-06 NOTE — Discharge Summary (Signed)
Chelsea Nolan, 35 y.o., DOB 08-02-1981, MRN [id number]. Admission date: 02/05/2016 Discharge Date 02/06/2016 Primary MD Wynona Dove, MD Admitting Physician Wyatt Haste, MD  Admission Diagnosis  Cough [R05] Elevated troponin I level [R79.89] Fever, unspecified fever cause [R50.9] Chest pain, unspecified chest pain type [R07.9]  Discharge Diagnosis   Active Problems:   Chest pain due to acute bronchitis   Minimally elevated troponin due to bronchitis  Fever due to acute bronchitis  Hypertension essential Psoriatic arthritis Morbid obesity History of rheumatoid arthritis Nicotine addiction        Hospital CourseCasey Etheridge is a 35 y.o. female with a known history of Psoriatic arthritis on methotrexate who is presenting with chest pain. She describes three-day duration of upper respiratory symptoms now one day duration of nonproductive cough with chest pain described as sharp under the left breast in location and nonradiating worse with cough no relieving factors mild shortness of breath also positive for subjective fevers and chills. Patient was seen in the ED and was noted to have a slightly abnormal troponin and therefore she was admitted to the hospital. Further evaluation included CT of the chest which showed no pneumonia. Her troponin normalized after the first set. She was started on antibiotics and antitussive medications with significant improvement in her symptoms. She is doing much better and is stable for discharge. If chest pain returns we'll need outpatient stress test.             Consults none Significant Tests:  See full reports for all details    Dg Chest 2 View  02/05/2016  CLINICAL DATA:  Cough and chest pain ; shortness of breath EXAM: CHEST  2 VIEW COMPARISON:  September 11, 2015 FINDINGS: There is no edema or consolidation. The heart size and pulmonary vascularity are normal. No adenopathy. No pneumothorax. No bone lesions. IMPRESSION: No edema or  consolidation. Electronically Signed   By: Bretta Bang III M.D.   On: 02/05/2016 13:43   Ct Angio Chest Pe W/cm &/or Wo Cm  02/05/2016  CLINICAL DATA:  Cough, chest pain and shortness of breath for 2-1/2 days. Fever. EXAM: CT ANGIOGRAPHY CHEST WITH CONTRAST TECHNIQUE: Multidetector CT imaging of the chest was performed using the standard protocol during bolus administration of intravenous contrast. Multiplanar CT image reconstructions and MIPs were obtained to evaluate the vascular anatomy. CONTRAST:  100 cc Isovue 370. COMPARISON:  PA and lateral chest earlier today. Single view of the chest 09/11/2015. FINDINGS: No pulmonary embolus is identified. There is no axillary, hilar or mediastinal lymphadenopathy. No pleural or pericardial effusion. Heart size is normal. Mild dependent atelectasis is seen. The lungs are otherwise clear. Visualized upper abdomen shows fatty infiltration of the liver. No bony abnormality is seen. Review of the MIP images confirms the above findings. IMPRESSION: Negative for pulmonary embolus or finding to explain the patient's symptoms. Fatty infiltration of the liver. Electronically Signed   By: Drusilla Kanner M.D.   On: 02/05/2016 15:50       Today   Subjective:   Aamiyah Derrick  feeling better denies any chest pain  Objective:   Blood pressure 115/69, pulse 94, temperature 98 F (36.7 C), temperature source Oral, resp. rate 26, height 5' 1.5" (1.562 m), weight 91.581 kg (201 lb 14.4 oz), last menstrual period 02/06/2015, SpO2 94 %.  .  Intake/Output Summary (Last 24 hours) at 02/06/16 1346 Last data filed at 02/06/16 0900  Gross per 24 hour  Intake    246 ml  Output      0 ml  Net    246 ml    Exam VITAL SIGNS: Blood pressure 115/69, pulse 94, temperature 98 F (36.7 C), temperature source Oral, resp. rate 26, height 5' 1.5" (1.562 m), weight 91.581 kg (201 lb 14.4 oz), last menstrual period 02/06/2015, SpO2 94 %.  GENERAL:  35 y.o.-year-old patient lying  in the bed with no acute distress.  EYES: Pupils equal, round, reactive to light and accommodation. No scleral icterus. Extraocular muscles intact.  HEENT: Head atraumatic, normocephalic. Oropharynx and nasopharynx clear.  NECK:  Supple, no jugular venous distention. No thyroid enlargement, no tenderness.  LUNGS: Normal breath sounds bilaterally, no wheezing, rales,rhonchi or crepitation. No use of accessory muscles of respiration.  CARDIOVASCULAR: S1, S2 normal. No murmurs, rubs, or gallops.  ABDOMEN: Soft, nontender, nondistended. Bowel sounds present. No organomegaly or mass.  EXTREMITIES: No pedal edema, cyanosis, or clubbing.  NEUROLOGIC: Cranial nerves II through XII are intact. Muscle strength 5/5 in all extremities. Sensation intact. Gait not checked.  PSYCHIATRIC: The patient is alert and oriented x 3.  SKIN: No obvious rash, lesion, or ulcer.   Data Review     CBC w Diff: Lab Results  Component Value Date   WBC 16.4* 02/05/2016   WBC 15.7* 09/02/2014   HGB 16.1* 02/05/2016   HGB 14.4 09/02/2014   HCT 46.4 02/05/2016   HCT 43.1 09/02/2014   PLT 288 02/05/2016   PLT 334 09/02/2014   LYMPHOPCT 39 01/20/2015   LYMPHOPCT 34.6 09/02/2014   MONOPCT 5 01/20/2015   MONOPCT 8.3 09/02/2014   EOSPCT 6 01/20/2015   EOSPCT 3.5 09/02/2014   BASOPCT 1 01/20/2015   BASOPCT 1.1 09/02/2014   CMP: Lab Results  Component Value Date   NA 135 02/05/2016   NA 141 09/02/2014   K 3.5 02/05/2016   K 3.8 09/02/2014   CL 101 02/05/2016   CL 108* 09/02/2014   CO2 23 02/05/2016   CO2 28 09/02/2014   BUN 10 02/05/2016   BUN 9 09/02/2014   CREATININE 0.66 02/05/2016   CREATININE 0.68 09/02/2014   PROT 6.9 01/20/2015   PROT 7.3 09/02/2014   ALBUMIN 3.6 01/20/2015   ALBUMIN 3.4 09/02/2014   BILITOT 0.3 01/20/2015   BILITOT 0.2 09/02/2014   ALKPHOS 84 01/20/2015   ALKPHOS 112 09/02/2014   AST 36 01/20/2015   AST 26 09/02/2014   ALT 30 01/20/2015   ALT 35 09/02/2014  .  Micro  Results No results found for this or any previous visit (from the past 240 hour(s)).      Code Status Orders        Start     Ordered   02/05/16 1613  Full code   Continuous     02/05/16 1613    Code Status History    Date Active Date Inactive Code Status Order ID Comments User Context   This patient has a current code status but no historical code status.    Advance Directive Documentation        Most Recent Value   Type of Advance Directive  Healthcare Power of Attorney   Pre-existing out of facility DNR order (yellow form or pink MOST form)     "MOST" Form in Place?            Follow-up Information    Follow up with Wynona Dove, MD. Nyra Capes on 02/12/2016.   Specialty:  Internal Medicine   Why:  Time:11:30 a.m.  Contact information:   [address] [place] [address] [phone]       Discharge Medications     Medication List    TAKE these medications        alprazolam 2 MG tablet  Commonly known as:  XANAX  Take 2 mg by mouth 4 (four) times daily.     butalbital-acetaminophen-caffeine 50-325-40 MG tablet  Commonly known as:  FIORICET, ESGIC  Take 1 tablet by mouth 2 (two) times daily as needed for headache.     FLUoxetine 40 MG capsule  Commonly known as:  PROZAC  Take 40 mg by mouth daily.     guaiFENesin-codeine 100-10 MG/5ML syrup  Take 5 mLs by mouth every 4 (four) hours as needed for cough.     ibuprofen 600 MG tablet  Commonly known as:  ADVIL,MOTRIN  Take 1 tablet (600 mg total) by mouth every 6 (six) hours as needed for moderate pain.     levofloxacin 500 MG tablet  Commonly known as:  LEVAQUIN  Take 1 tablet (500 mg total) by mouth daily.     loratadine 10 MG tablet  Commonly known as:  CLARITIN  Take 10 mg by mouth daily as needed for allergies.     Methotrexate (PF) 25 MG/0.5ML Soaj     nicotine 21 mg/24hr patch  Commonly known as:  NICODERM CQ - dosed in mg/24 hours  Place 1 patch (21 mg total)  onto the skin daily.     prazosin 2 MG capsule  Commonly known as:  MINIPRESS  Take 2-4 mg by mouth at bedtime. Pt takes 1 to 2 capsules by mouth at bedtime.     Vitamin D 2000 units Caps  Take 1 capsule by mouth daily.           Total Time in preparing paper work, data evaluation and todays exam - 35 minutes  Auburn Bilberry M.D on 02/06/2016 at 1:47 PM  [facility]   Office  [phone]

## 2016-02-07 ENCOUNTER — Telehealth: Payer: Self-pay

## 2016-02-07 NOTE — Telephone Encounter (Signed)
Transition Care Management Follow-up Telephone Call   Date discharged?02/06/16   How have you been since you were released from the hospital? STILL A LITTLE TENDER IN THE CHEST AREA FROM PREVIOUS COUGHING BUT NO SHARP OR SHOOTING PAIN.  COUGH IS PRESENT BUT NOT AS BAD.  NO FEVER.  BLOOD PRESSURE IS WITHIN NORMAL RANGE.  DRINKING PLENTY OF FLUIDS. BLOOD IN URINE LAST NIGHT BUT NONE TODAY.  NO PAIN WITH URINATION. SLEEPING OK.   Do you understand why you were in the hospital? YES, EXTREMELY FATIGUED WITH COUGH AND CHEST PAIN.   Do you understand the discharge instructions? YES, REST, INCREASE ACTIVITY AS TOLERATED, DRINK PLENTY OF FLUIDS/WATER, FOLLOW UP WITH PCP, FOLLOW UP WITH CARDIOLOGY DEPENDING ON TEST RESULTS.     Where were you discharged to? Home.   Items Reviewed:  Medications reviewed: YES, CONTINUE TO TAKE ALL SCHEDULED MEDICATIONS, AS DIRECTED.  NO CHANGES.        Allergies reviewed: YES, PENICILLINS, ENBREL, FLEXERIL, PREDNISONE, SERTRALINE,  TOPAMAX, AMOXICILLIN, ZOFRAN.  Dietary changes reviewed: YES, REGULAR DIET, NO RESTRICTIONS.  Referrals reviewed: YES, HOSPITAL FOLLOW UP SCHEDULED WITH PCP.  AWAITING RESULTS TO DETERMINE FOLLOW UP WITH CARDIO.   Functional Questionnaire:  Activities of Daily Living (ADLs):   She states they are independent in the following: INDEPENDENT IN ALL ADLS. States they require assistance with the following: DOES NOT REQUIRE ASSISTANCE AT THIS TIME.   Any transportation issues/concerns?: NO.   Any patient concerns? NONE.   Confirmed importance and date/time of follow-up visits scheduled?  YES, appointment scheduled 02/12/16 at 11:30.  Provider Appointment booked with Dr. Dan Humphreys (PCP).  Confirmed with patient if condition begins to worsen call PCP or go to the ER.  Patient was given the office number and encouraged to call back with question or concerns.  : YES, VERBALIZED UNDERSTANDING.

## 2016-02-07 NOTE — Telephone Encounter (Signed)
Noted. Will follow with transitional care management. Hospital follow up scheduled with PCP.

## 2016-02-10 ENCOUNTER — Ambulatory Visit: Payer: Self-pay | Admitting: Internal Medicine

## 2016-02-12 ENCOUNTER — Encounter: Payer: Self-pay | Admitting: Internal Medicine

## 2016-02-12 ENCOUNTER — Ambulatory Visit (INDEPENDENT_AMBULATORY_CARE_PROVIDER_SITE_OTHER): Payer: Self-pay | Admitting: Internal Medicine

## 2016-02-12 ENCOUNTER — Ambulatory Visit (INDEPENDENT_AMBULATORY_CARE_PROVIDER_SITE_OTHER): Payer: Self-pay

## 2016-02-12 VITALS — BP 148/98 | HR 100 | Ht 62.0 in | Wt 205.4 lb

## 2016-02-12 DIAGNOSIS — F4310 Post-traumatic stress disorder, unspecified: Secondary | ICD-10-CM

## 2016-02-12 DIAGNOSIS — R079 Chest pain, unspecified: Secondary | ICD-10-CM

## 2016-02-12 MED ORDER — KETOROLAC TROMETHAMINE 60 MG/2ML IJ SOLN: 60.0000 mg | Freq: Once | INTRAMUSCULAR | Status: DC

## 2016-02-12 MED ORDER — TRAMADOL HCL 50 MG PO TABS: 50.0000 mg | ORAL_TABLET | Freq: Three times a day (TID) | ORAL | Status: DC | PRN

## 2016-02-12 MED ORDER — KETOROLAC TROMETHAMINE 30 MG/ML IJ SOLN: 30.0000 mg | Freq: Once | INTRAMUSCULAR | Status: DC

## 2016-02-12 MED ORDER — KETOROLAC TROMETHAMINE 60 MG/2ML IM SOLN: 60.0000 mg | Freq: Once | INTRAMUSCULAR | Status: DC

## 2016-02-12 NOTE — Patient Instructions (Addendum)
Chest xray today.  Toradol injection today to help control pain.  Continue Ibuprofen at home.  Use Tramadol as needed for severe pain.  We will set up an evaluation with cardiology.  Follow up in 4 weeks.

## 2016-02-12 NOTE — Progress Notes (Signed)
Subjective:    Patient ID: Chelsea Nolan, female    DOB: [birth year], 35 y.o.   MRN: [id number]  HPI  35YO female presents for hospital follow up.  ADMITTED: 02/05/2016 DISCHARGED: 02/06/2016  DIAGNOSIS: Cough, Chest pain, Bronchitis  She reports she had 2 days of nasal congestion, followed by cough, chest pain. Admitted after presenting to ER with chest pain. Noted to have mildly abnormal troponin.  CT Chest 7/5 showed no abnormalities.  Cutting back on cigarette use. Planning to use Nicotine patch. Tried Chantix in past, but had worsening anxiety.  Continues to have some chest pain diffusely and cough productive of sputum. Cough has improved slightly. Using Ibuprofen and Codeine syrup as needed for cough.  Following with psychiatry for worsening anxiety. Next scheduled in July. Recently lost her brother to heroin overdose. Was inpatient in psychiatric facility a couple of month ago for anxiety.    Wt Readings from Last 3 Encounters:  02/12/16 205 lb 6.4 oz (93.169 kg)  02/05/16 201 lb 14.4 oz (91.581 kg)  09/11/15 189 lb (85.73 kg)   BP Readings from Last 3 Encounters:  02/12/16 148/98  02/06/16 115/69  09/11/15 160/109    Past Medical History  Diagnosis Date  . Arthritis   . Hypertension   . Psoriatic arthritis [facility])     Dermatologist - Dr. Adolphus Birchwood, Rheumatologist - Dr. Gavin Potters  . Obesity   . Rheumatoid arthritis (HCC)   . Kidney stones    Family History  Problem Relation Age of Onset  . Diabetes Mother   . Hyperlipidemia Mother   . Hypertension Mother   . Arthritis Mother   . Lupus Mother   . Obesity Sister   . Cancer Maternal Grandmother     ovarian   Past Surgical History  Procedure Laterality Date  . Vaginal delivery    . Cesarean section      pre-eclampsia  . Lithotripsy     Social History   Social History  . Marital Status: Married    Spouse Name: N/A  . Number of Children: N/A  . Years of Education: N/A   Social History Main Topics  .  Smoking status: Current Every Day Smoker -- 0.50 packs/day for 15 years    Types: Cigarettes  . Smokeless tobacco: Never Used  . Alcohol Use: No  . Drug Use: No  . Sexual Activity: Yes   Other Topics Concern  . None   Social History Narrative   Lives in [place] with 2 daughters and husband. Dog, 3 cats, prairie dog.      Work - Printmaker      Diet - limited fried food, limited sweets      Exercise - none    Review of Systems  Constitutional: Positive for fatigue. Negative for fever, chills, appetite change and unexpected weight change.  Eyes: Negative for visual disturbance.  Respiratory: Positive for cough, chest tightness and shortness of breath. Negative for wheezing.   Cardiovascular: Positive for chest pain (diffuse chest wall pain over ribs). Negative for palpitations and leg swelling.  Gastrointestinal: Negative for nausea, vomiting, abdominal pain, diarrhea and constipation.  Musculoskeletal: Positive for myalgias and arthralgias.  Skin: Negative for color change and rash.  Hematological: Negative for adenopathy. Does not bruise/bleed easily.  Psychiatric/Behavioral: Negative for dysphoric mood. The patient is nervous/anxious.        Objective:    BP 148/98 mmHg  Pulse 100  Ht 5\' 2"  (1.575 m)  Wt 205  lb 6.4 oz (93.169 kg)  BMI 37.56 kg/m2  SpO2 95%  LMP 02/06/2015 Physical Exam  Constitutional: She is oriented to person, place, and time. She appears well-developed and well-nourished. No distress.  HENT:  Head: Normocephalic and atraumatic.  Right Ear: External ear normal.  Left Ear: External ear normal.  Nose: Nose normal.  Mouth/Throat: Oropharynx is clear and moist. No oropharyngeal exudate.  Eyes: Conjunctivae are normal. Pupils are equal, round, and reactive to light. Right eye exhibits no discharge. Left eye exhibits no discharge. No scleral icterus.  Neck: Normal range of motion. Neck supple. No tracheal deviation present. No  thyromegaly present.  Cardiovascular: Normal rate, regular rhythm, normal heart sounds and intact distal pulses.  Exam reveals no gallop and no friction rub.   No murmur heard. Pulmonary/Chest: Effort normal and breath sounds normal. No tachypnea. No respiratory distress. She has no decreased breath sounds. She has no wheezes. She has no rhonchi. She has no rales. She exhibits tenderness.  Diffuse chest wall tenderness over ribs bilaterally  Musculoskeletal: Normal range of motion. She exhibits no edema or tenderness.  Lymphadenopathy:    She has no cervical adenopathy.  Neurological: She is alert and oriented to person, place, and time. No cranial nerve deficit. She exhibits normal muscle tone. Coordination normal.  Skin: Skin is warm and dry. No rash noted. She is not diaphoretic. No erythema. No pallor.  Psychiatric: She has a normal mood and affect. Her behavior is normal. Judgment and thought content normal.          Assessment & Plan:   Problem List Items Addressed This Visit      Unprioritized   Chest pain - Primary (Chronic)    Chest wall pain after recent bronchitis. Persistent pain likely related muscular strain from cough. Strongly encouraged her to quit smoking. Repeat CXR today. Pulmonary exam today normal. Reviewed hospital records. Note mild elevation of troponin in ER. Suspect this is related to strain and not CAD, however she does have risk of CAD including tobacco use, autoimmune disease, obesity, family hx. Will set up cardiology evaluation.      Relevant Orders   Ambulatory referral to Cardiology   DG Chest 2 View   PTSD (post-traumatic stress disorder) (Chronic)    PTSD followed by psychiatry in [facility] previously, as related to workman's comp issue. On Fluoxetine and high dose of Alprazolam, prescribed by psychiatry. She is scheduled now to follow up with a psychiatrist in [place]. Encouraged follow up.          Return in about 4 weeks (around 03/11/2016)  for New Patient.  Ronna Polio, MD Internal Medicine [facility]

## 2016-02-12 NOTE — Assessment & Plan Note (Signed)
PTSD followed by psychiatry in [facility] previously, as related to workman's comp issue. On Fluoxetine and high dose of Alprazolam, prescribed by psychiatry. She is scheduled now to follow up with a psychiatrist in [place]. Encouraged follow up.

## 2016-02-12 NOTE — Assessment & Plan Note (Addendum)
Chest wall pain after recent bronchitis. Persistent pain likely related muscular strain from cough. Strongly encouraged her to quit smoking. Repeat CXR today. Pulmonary exam today normal. Reviewed hospital records. Note mild elevation of troponin in ER. Suspect this is related to strain and not CAD, however she does have risk of CAD including tobacco use, autoimmune disease, obesity, family hx. Will set up cardiology evaluation.

## 2016-02-12 NOTE — Addendum Note (Signed)
Addended byElise Benne T on: 02/12/2016 12:26 PM   Modules accepted: Orders

## 2016-03-01 ENCOUNTER — Emergency Department
Admission: EM | Admit: 2016-03-01 | Discharge: 2016-03-01 | Disposition: A | Discharge status: Home / Self Care | Payer: Self-pay | Attending: Emergency Medicine | Admitting: Emergency Medicine

## 2016-03-01 ENCOUNTER — Emergency Department: Payer: Self-pay

## 2016-03-01 DIAGNOSIS — Z79899 Other long term (current) drug therapy: Secondary | ICD-10-CM | POA: Insufficient documentation

## 2016-03-01 DIAGNOSIS — R079 Chest pain, unspecified: Secondary | ICD-10-CM | POA: Insufficient documentation

## 2016-03-01 DIAGNOSIS — I10 Essential (primary) hypertension: Secondary | ICD-10-CM | POA: Insufficient documentation

## 2016-03-01 DIAGNOSIS — R55 Syncope and collapse: Secondary | ICD-10-CM | POA: Insufficient documentation

## 2016-03-01 DIAGNOSIS — F17210 Nicotine dependence, cigarettes, uncomplicated: Secondary | ICD-10-CM | POA: Insufficient documentation

## 2016-03-01 DIAGNOSIS — F410 Panic disorder [episodic paroxysmal anxiety] without agoraphobia: Secondary | ICD-10-CM | POA: Insufficient documentation

## 2016-03-01 LAB — CBC
HEMATOCRIT: 43.5 % (ref 35.0–47.0)
HEMOGLOBIN: 15.2 g/dL (ref 12.0–16.0)
MCH: 33.8 pg (ref 26.0–34.0)
MCHC: 34.9 g/dL (ref 32.0–36.0)
MCV: 96.8 fL (ref 80.0–100.0)
Platelets: 274 10*3/uL (ref 150–440)
RBC: 4.5 MIL/uL (ref 3.80–5.20)
RDW: 14.5 % (ref 11.5–14.5)
WBC: 13.1 10*3/uL — AB (ref 3.6–11.0)

## 2016-03-01 LAB — COMPREHENSIVE METABOLIC PANEL
ALBUMIN: 3.6 g/dL (ref 3.5–5.0)
ALK PHOS: 95 U/L (ref 38–126)
ALT: 24 U/L (ref 14–54)
AST: 26 U/L (ref 15–41)
Anion gap: 8 (ref 5–15)
BUN: 10 mg/dL (ref 6–20)
CALCIUM: 8.6 mg/dL — AB (ref 8.9–10.3)
CHLORIDE: 104 mmol/L (ref 101–111)
CO2: 25 mmol/L (ref 22–32)
CREATININE: 0.59 mg/dL (ref 0.44–1.00)
GFR calc Af Amer: >60 mL/min (ref >60)
GFR calc non Af Amer: >60 mL/min (ref >60)
GLUCOSE: 101 mg/dL — AB (ref 65–99)
Potassium: 3.4 mmol/L — ABNORMAL LOW (ref 3.5–5.1)
SODIUM: 137 mmol/L (ref 135–145)
Total Bilirubin: 0.6 mg/dL (ref 0.3–1.2)
Total Protein: 7.1 g/dL (ref 6.5–8.1)

## 2016-03-01 LAB — TROPONIN I

## 2016-03-01 MED ORDER — DIAZEPAM 5 MG PO TABS
10.0000 mg | ORAL_TABLET | Freq: Once | ORAL | Status: AC
  Administered 2016-03-01: 10 mg via ORAL
  Filled 2016-03-01: qty 2

## 2016-03-01 NOTE — ED Triage Notes (Signed)
Per EMS: Pt was unresponsive when medic truck arrived. Pt AO X 4 for [facility]. Pt had episode of chest pain and syncope. Pt hypertensive

## 2016-03-01 NOTE — ED Provider Notes (Signed)
[facility] Emergency Department Provider Note        Time seen: ----------------------------------------- 8:17 PM on 03/01/2016 -----------------------------------------    I have reviewed the triage vital signs and the nursing notes.   HISTORY  Chief Complaint No chief complaint on file.    HPI Chelsea Nolan is a 35 y.o. female who presents to the ER for an unresponsive episode. EMS was called by her significant other who states she was not acting appropriate. Patient was found by EMS because complained of left-sided chest pain that radiated into her shoulder. She was seen in the ER previously this month for nonspecific chest pain. Patient states her panic attacks feel different than this. She denies recent illness, nothing makes the chest pain better or worse.   Past Medical History:  Diagnosis Date  . Arthritis   . Hypertension   . Kidney stones   . Obesity   . Psoriatic arthritis [facility])    Dermatologist - Dr. Adolphus Birchwood, Rheumatologist - Dr. Gavin Potters  . Rheumatoid arthritis [facility])     Patient Active Problem List   Diagnosis Date Noted  . Chest pain 02/05/2016  . Migraine with aura 01/16/2015  . Neck pain 01/10/2015  . Renal stones 05/17/2014  . PTSD (post-traumatic stress disorder) 04/02/2014  . Menorrhagia 11/15/2013  . Family history of ovarian cancer 11/15/2013  . Muscle spasm 06/12/2013  . Tobacco abuse 03/08/2013  . Psoriatic arthritis (HCC) 01/26/2013  . Essential hypertension, benign 01/26/2013  . Obesity, unspecified 01/26/2013    Past Surgical History:  Procedure Laterality Date  . CESAREAN SECTION     pre-eclampsia  . LITHOTRIPSY    . VAGINAL DELIVERY      Allergies Penicillins; Enbrel [etanercept]; Flexeril [cyclobenzaprine]; Prednisone; Sertraline; Topamax [topiramate]; Amoxicillin; and Zofran [facility]]  Social History Social History  Substance Use Topics  . Smoking status: Current Every Day Smoker     Packs/day: 0.50    Years: 15.00    Types: Cigarettes  . Smokeless tobacco: Never Used  . Alcohol use No    Review of Systems Constitutional: Negative for fever. Cardiovascular: Positive for chest pain Respiratory: Negative for shortness of breath. Gastrointestinal: Negative for abdominal pain, vomiting and diarrhea. Genitourinary: Negative for dysuria. Musculoskeletal: Negative for back pain. Skin: Negative for rash. Neurological: Negative for headaches, focal weakness or numbness.  10-point ROS otherwise negative.  ____________________________________________   PHYSICAL EXAM:  VITAL SIGNS: ED Triage Vitals  Enc Vitals Group     BP      Pulse      Resp      Temp      Temp src      SpO2      Weight      Height      Head Circumference      Peak Flow      Pain Score      Pain Loc      Pain Edu?      Excl. in GC?     Constitutional: Alert and oriented. Anxious, mild distress Eyes: Conjunctivae are normal. PERRL. Normal extraocular movements. ENT   Head: Normocephalic and atraumatic.   Nose: No congestion/rhinnorhea.   Mouth/Throat: Mucous membranes are moist.   Neck: No stridor. Cardiovascular: Normal rate, regular rhythm. No murmurs, rubs, or gallops. Respiratory: Normal respiratory effort without tachypnea nor retractions. Breath sounds are clear and equal bilaterally. No wheezes/rales/rhonchi. Gastrointestinal: Soft and nontender. Normal bowel sounds Musculoskeletal: Nontender with normal range of motion in all extremities.  No lower extremity tenderness nor edema. Neurologic:  Normal speech and language. No gross focal neurologic deficits are appreciated.  Skin:  Skin is warm, dry and intact. No rash noted. Psychiatric: Anxious mood and affect  ____________________________________________  EKG: Interpreted by me. Sinus rhythm with a rate of 89 bpm, normal PR interval, normal QRS, normal QT  interval.  ____________________________________________  ED COURSE:  Pertinent labs & imaging results that were available during my care of the patient were reviewed by me and considered in my medical decision making (see chart for details). Clinical Course  Patient presents to the ER with what appears to be a panic attack, we will check cardiac labs, give oral Valium and obtained a chest x-ray.  Procedures ____________________________________________   LABS (pertinent positives/negatives)  Labs Reviewed  CBC - Abnormal; Notable for the following:       Result Value   WBC 13.1 (*)    All other components within normal limits  COMPREHENSIVE METABOLIC PANEL - Abnormal; Notable for the following:    Potassium 3.4 (*)    Glucose, Bld 101 (*)    Calcium 8.6 (*)    All other components within normal limits  TROPONIN I    RADIOLOGY  Chest x-ray Is unremarkable ____________________________________________  FINAL ASSESSMENT AND PLAN  Near-syncope, panic attack  Plan: Patient with labs and imaging as dictated above. Patient likely presents to the ER after panic attack. She has had a negative workup to this point. She has outpatient follow-up with cardiology in 3 days. I will advise keeping the appointment, returning for worsening symptoms.   Emily Filbert, MD   Note: This dictation was prepared with Dragon dictation. Any transcriptional errors that result from this process are unintentional    Emily Filbert, MD 03/01/16 2148

## 2016-03-01 NOTE — ED Notes (Signed)

## 2016-03-18 ENCOUNTER — Encounter: Payer: Self-pay | Admitting: Family Medicine

## 2016-03-18 ENCOUNTER — Ambulatory Visit (INDEPENDENT_AMBULATORY_CARE_PROVIDER_SITE_OTHER): Payer: Self-pay | Admitting: Family Medicine

## 2016-03-18 DIAGNOSIS — I10 Essential (primary) hypertension: Secondary | ICD-10-CM

## 2016-03-18 DIAGNOSIS — E785 Hyperlipidemia, unspecified: Secondary | ICD-10-CM

## 2016-03-18 DIAGNOSIS — G43109 Migraine with aura, not intractable, without status migrainosus: Secondary | ICD-10-CM

## 2016-03-18 DIAGNOSIS — R079 Chest pain, unspecified: Secondary | ICD-10-CM

## 2016-03-18 MED ORDER — BUTALBITAL-APAP-CAFFEINE 50-325-40 MG PO TABS: 1.0000 | ORAL_TABLET | Freq: Two times a day (BID) | ORAL | 1 refills | Status: DC | PRN

## 2016-03-18 NOTE — Assessment & Plan Note (Signed)
Stable. Fioricet refilled.  

## 2016-03-18 NOTE — Progress Notes (Signed)
Subjective:  Patient ID: Chelsea Nolan, female    DOB: 1981-05-19  Age: 35 y.o. MRN: [id number]  CC: Follow up Chest pain  HPI:  35 year old female with a history of HTN, Migraine, PTSD presents for follow up regarding chest pain  Chest pain  Patient recently seen in the ED on 7/30 for chest pain.  Workup was unremarkable and chest pain was thought to be secondary to panic attack.  She was encouraged to follow with cardiology.  She has seen a cardiologist and has had a negative stress test.  Patient presents today for follow-up regarding this.  No further episodes of chest pain.  She attributes her symptoms primarily to increasing stressors - PTSD, death in the family, etc.  No reports of SOB.  She has quit smoking.  HTN  Patient has a history of hypertension.  Previously improved and did no knee medication secondary to weight loss.  Has recently worsened.  Cardiology has been the patient on chlorthalidone but she is not taking this at this time. She states that she was unaware of this.  BP elevated today.  We'll discuss today.   Patient attribute her blood pressure increased to anxiety as well as pain. She states that she's having trouble with her "kidneys" at this time.   Migraine  Stable.   Doing well on Fioricet.  Hyperlipidemia  New problem.  Noted  all recent labs from cardiology.  Untreated this time.  We'll discuss today.   Social Hx   Social History   Social History  . Marital status: Married    Spouse name: N/A  . Number of children: N/A  . Years of education: N/A   Social History Main Topics  . Smoking status: Current Every Day Smoker    Packs/day: 0.50    Years: 15.00    Types: Cigarettes  . Smokeless tobacco: Never Used  . Alcohol use No  . Drug use: No  . Sexual activity: Yes   Other Topics Concern  . None   Social History Narrative   Lives in [place] with 2 daughters and husband. Dog, 3 cats, prairie dog.      Work -  Printmaker      Diet - limited fried food, limited sweets      Exercise - none   Review of Systems  Cardiovascular: Negative for chest pain.  Psychiatric/Behavioral: The patient is nervous/anxious.    Objective:  BP (!) 159/109 (BP Location: Right Arm, Patient Position: Sitting, Cuff Size: Large)   Pulse 90   Temp 98.4 F (36.9 C) (Oral)   Wt 205 lb 4 oz (93.1 kg)   LMP 02/06/2015   SpO2 97%   BMI 37.54 kg/m   BP/Weight 03/18/2016 03/01/2016 02/12/2016  Systolic BP 159 134 148  Diastolic BP 109 99 98  Wt. (Lbs) 205.25 185 205.4  BMI 37.54 33.84 37.56   Physical Exam  Constitutional: She is oriented to person, place, and time. She appears well-developed. No distress.  Cardiovascular: Regular rhythm.  Tachycardia present.   Pulmonary/Chest: Effort normal. She has no wheezes. She has no rales.  Neurological: She is alert and oriented to person, place, and time.  Psychiatric:  Flat affect.   Vitals reviewed.  Lab Results  Component Value Date   WBC 13.1 (H) 03/01/2016   HGB 15.2 03/01/2016   HCT 43.5 03/01/2016   PLT 274 03/01/2016   GLUCOSE 101 (H) 03/01/2016   ALT 24 03/01/2016  AST 26 03/01/2016   NA 137 03/01/2016   K 3.4 (L) 03/01/2016   CL 104 03/01/2016   CREATININE 0.59 03/01/2016   BUN 10 03/01/2016   CO2 25 03/01/2016   TSH 2.30 12/10/2014   MICROALBUR 0.5 01/26/2013    Assessment & Plan:   Problem List Items Addressed This Visit    Chest pain (Chronic)    Established problem, stable/improved. Negative stress test. Likely secondary to anxiety. Supportive care.       Essential hypertension, benign    BP elevated today. Patient attributes to pain. Planning to repeat blood pressure in 2 weeks and then will start medication if elevated.      Hyperlipidemia    New problem. Unclear benefit in her age group. Will continue to monitor. Advised weight loss.       Migraine with aura    Stable. Fioricet refilled.        Relevant Medications   butalbital-acetaminophen-caffeine (FIORICET, ESGIC) 50-325-40 MG tablet    Other Visit Diagnoses   None.     Meds ordered this encounter  Medications  . butalbital-acetaminophen-caffeine (FIORICET, ESGIC) 50-325-40 MG tablet    Sig: Take 1 tablet by mouth 2 (two) times daily as needed for headache.    Dispense:  60 tablet    Refill:  1    Follow-up: 2 weeks for BP check   Everlene Other DO [facility]

## 2016-03-18 NOTE — Assessment & Plan Note (Signed)
BP elevated today. Patient attributes to pain. Planning to repeat blood pressure in 2 weeks and then will start medication if elevated.

## 2016-03-18 NOTE — Assessment & Plan Note (Signed)
New problem. Unclear benefit in her age group. Will continue to monitor. Advised weight loss.

## 2016-03-18 NOTE — Assessment & Plan Note (Signed)
Established problem, stable/improved. Negative stress test. Likely secondary to anxiety. Supportive care.

## 2016-03-18 NOTE — Patient Instructions (Signed)
Follow up in 2 weeks for a BP check.  Continue your current medications.  Follow up to be scheduled after BP check.  Take care  Dr. Adriana Simas

## 2016-03-18 NOTE — Progress Notes (Signed)
Pre visit review using our clinic review tool, if applicable. No additional management support is needed unless otherwise documented below in the visit note. 

## 2016-04-03 ENCOUNTER — Ambulatory Visit: Payer: Self-pay | Admitting: Family Medicine

## 2016-04-03 DIAGNOSIS — Z0289 Encounter for other administrative examinations: Secondary | ICD-10-CM

## 2016-04-24 ENCOUNTER — Emergency Department
Admission: EM | Admit: 2016-04-24 | Discharge: 2016-04-24 | Disposition: A | Discharge status: Home / Self Care | Payer: Self-pay | Attending: Emergency Medicine | Admitting: Emergency Medicine

## 2016-04-24 ENCOUNTER — Encounter: Payer: Self-pay | Admitting: Emergency Medicine

## 2016-04-24 DIAGNOSIS — Z79899 Other long term (current) drug therapy: Secondary | ICD-10-CM | POA: Insufficient documentation

## 2016-04-24 DIAGNOSIS — I10 Essential (primary) hypertension: Secondary | ICD-10-CM | POA: Insufficient documentation

## 2016-04-24 DIAGNOSIS — M5432 Sciatica, left side: Secondary | ICD-10-CM | POA: Insufficient documentation

## 2016-04-24 DIAGNOSIS — F17210 Nicotine dependence, cigarettes, uncomplicated: Secondary | ICD-10-CM | POA: Insufficient documentation

## 2016-04-24 MED ORDER — TRAMADOL HCL 50 MG PO TABS: 50.0000 mg | ORAL_TABLET | Freq: Four times a day (QID) | ORAL | 0 refills | Status: DC | PRN

## 2016-04-24 MED ORDER — KETOROLAC TROMETHAMINE 60 MG/2ML IM SOLN
60.0000 mg | Freq: Once | INTRAMUSCULAR | Status: AC
  Administered 2016-04-24: 60 mg via INTRAMUSCULAR
  Filled 2016-04-24: qty 2

## 2016-04-24 MED ORDER — BACLOFEN 10 MG PO TABS: 10.0000 mg | ORAL_TABLET | Freq: Three times a day (TID) | ORAL | 0 refills | Status: AC

## 2016-04-24 MED ORDER — PREDNISONE 10 MG PO TABS: ORAL_TABLET | ORAL | 0 refills | Status: DC

## 2016-04-24 MED ORDER — PROMETHAZINE HCL 12.5 MG PO TABS: 12.5000 mg | ORAL_TABLET | Freq: Four times a day (QID) | ORAL | 0 refills | Status: DC | PRN

## 2016-04-24 NOTE — ED Triage Notes (Signed)
Left lower back pain down back of leg. Pt thinks her sciatic is acting up.

## 2016-04-24 NOTE — ED Provider Notes (Signed)
[facility] Emergency Department Provider Note  ____________________________________________  Time seen: Approximately 9:11 AM  I have reviewed the triage vital signs and the nursing notes.   HISTORY  Chief Complaint Sciatica    HPI Chelsea Nolan is a 35 y.o. female, NAD, presents to the emergency department with 2 day history of left lower back and leg pain.  Patients states she woke up yesterday with pain in her left lower back that "shoots down" her left leg, almost to the ankle.  She describes the pain as sharp and burning.  Patient states that the pain limits her ability to walk and bend.  She has tried ibuprofen, Tylenol, Aleve, icing, and heating with no relief of pain.  Her last took Aleve last night and nothing since that time.  Patient reports that the pain is not changed by position.  She denies any injury, falls, or trauma, but reports a recent increase in lifting as she has been caring for her mother.  She denies numbness, weakness, tingling, saddle paresthesia, loss of bowel or bladder control, loss of muscle strength, swelling, redness, abnormal warmth. Has not had any chest pain or shortness of breath. Denies abdominal pain, nausea, vomiting. Has not noted any rashes or open wounds. Does have a history of sciatica with last flare approximately 4 years ago that was similar.     Past Medical History:  Diagnosis Date  . Arthritis   . Hypertension   . Kidney stones   . Obesity   . Psoriatic arthritis [facility])    Dermatologist - Dr. Adolphus Birchwood, Rheumatologist - Dr. Gavin Potters  . Rheumatoid arthritis [facility])     Patient Active Problem List   Diagnosis Date Noted  . Hyperlipidemia 03/18/2016  . Chest pain 02/05/2016  . Migraine with aura 01/16/2015  . Renal stones 05/17/2014  . PTSD (post-traumatic stress disorder) 04/02/2014  . Family history of ovarian cancer 11/15/2013  . Psoriatic arthritis (HCC) 01/26/2013  . Essential hypertension, benign 01/26/2013  .  Obesity, unspecified 01/26/2013    Past Surgical History:  Procedure Laterality Date  . CESAREAN SECTION     pre-eclampsia  . LITHOTRIPSY    . VAGINAL DELIVERY      Prior to Admission medications   Medication Sig Start Date End Date Taking? Authorizing Provider  alprazolam Prudy Feeler) 2 MG tablet Take 2 mg by mouth 4 (four) times daily.    Historical Provider, MD  baclofen (LIORESAL) 10 MG tablet Take 1 tablet (10 mg total) by mouth 3 (three) times daily. 04/24/16 05/01/16  Bobbi Kozakiewicz L Parveen Freehling, PA-C  butalbital-acetaminophen-caffeine (FIORICET, ESGIC) 50-325-40 MG tablet Take 1 tablet by mouth 2 (two) times daily as needed for headache. 03/18/16   Tommie Sams, DO  Cholecalciferol (VITAMIN D) 2000 UNITS CAPS Take 1 capsule by mouth daily.     Historical Provider, MD  FLUoxetine (PROZAC) 40 MG capsule Take 40 mg by mouth daily.    Historical Provider, MD  loratadine (CLARITIN) 10 MG tablet Take 10 mg by mouth daily as needed for allergies.     Historical Provider, MD  Methotrexate, PF, 25 MG/0.5ML SOAJ  12/21/14   Historical Provider, MD  prazosin (MINIPRESS) 2 MG capsule Take 2-4 mg by mouth at bedtime. Pt takes 1 to 2 capsules by mouth at bedtime.    Historical Provider, MD  predniSONE (DELTASONE) 10 MG tablet Take a daily regimen of 6,5,4,3,2,1 04/24/16   Marty Sadlowski L Azrael Huss, PA-C  promethazine (PHENERGAN) 12.5 MG tablet Take 1 tablet (12.5 mg  total) by mouth every 6 (six) hours as needed for nausea or vomiting. 04/24/16 05/01/16  Aedyn Mckeon L Hieu Herms, PA-C  traMADol (ULTRAM) 50 MG tablet Take 1 tablet (50 mg total) by mouth every 6 (six) hours as needed. 04/24/16   Gerhardt Gleed L Sequoya Hogsett, PA-C    Allergies Penicillins; Enbrel [etanercept]; Flexeril [cyclobenzaprine]; Prednisone; Sertraline; Topamax [topiramate]; Amoxicillin; and Zofran [ondansetron hcl]  Family History  Problem Relation Age of Onset  . Diabetes Mother   . Hyperlipidemia Mother   . Hypertension Mother   . Arthritis Mother   . Lupus Mother   . Obesity  Sister   . Cancer Maternal Grandmother     ovarian    Social History Social History  Substance Use Topics  . Smoking status: Current Every Day Smoker    Packs/day: 0.50    Years: 15.00    Types: Cigarettes  . Smokeless tobacco: Never Used  . Alcohol use No     Review of Systems  Constitutional: No fever/chills Cardiovascular: No chest pain. Respiratory: No shortness of breath. No wheezing.  Gastrointestinal: No abdominal pain.  No nausea, vomiting.   Musculoskeletal: Positive for lower left back pain, left buttock pain and left leg pain.  Skin: Negative for rash, redness, swelling, skin sores, abnormal warmth.  Neurological: Negative for headaches, focal weakness or numbness. No tingling, saddle paresthesias, loss of bowel or bladder control. 10-point ROS otherwise negative.  ____________________________________________   PHYSICAL EXAM:  VITAL SIGNS: ED Triage Vitals [04/24/16 0826]  Enc Vitals Group     BP (!) 135/100     Pulse Rate (!) 103     Resp 20     Temp 97.9 F (36.6 C)     Temp Source Oral     SpO2 100 %     Weight 220 lb (99.8 kg)     Height 5\' 2"  (1.575 m)     Head Circumference      Peak Flow      Pain Score 9     Pain Loc      Pain Edu?      Excl. in GC?      Constitutional: Alert and oriented. Well appearing and in no acute distress, but in pain. Eyes: Conjunctivae are normal without icterus or injection Head: Atraumatic. Neck: Supple with full range of motion. Hematological/Lymphatic/Immunilogical: No cervical lymphadenopathy. Cardiovascular: Good peripheral circulation with 2+ pulses noted in the left lower extremity. Respiratory: Normal respiratory effort without tachypnea or retractions.  Musculoskeletal: Tenderness on palpation of paraspinal regions of the lumbar spine diffusely.  Positive left SI joint tenderness. Positive left straight leg raise. Tenderness to palpation about the soft tissues of the left hip and upper thigh without any  masses or abnormalities. ROM of left back rotation, left hip flexion, left hip abduction limited by pain. Muscle strength 5/5 of bilateral lower extremities. Neurologic:  Normal speech and language. No gross focal neurologic deficits are appreciated. Sensation to light touch grossly intact bilaterally lower extremity. Skin:  Skin is warm, dry and intact. No rash, redness, swelling, abnormal warmth, bruising, skin sores noted. Psychiatric: Mood and affect are normal. Speech and behavior are normal. Patient exhibits appropriate insight and judgement.   ____________________________________________   LABS  None ____________________________________________  EKG  None ____________________________________________  RADIOLOGY  None ____________________________________________    PROCEDURES  Procedure(s) performed: None   Procedures   Medications  ketorolac (TORADOL) injection 60 mg (60 mg Intramuscular Given 04/24/16 0947)     ____________________________________________   INITIAL IMPRESSION /  ASSESSMENT AND PLAN / ED COURSE  Pertinent labs & imaging results that were available during my care of the patient were reviewed by me and considered in my medical decision making (see chart for details).  Clinical Course    Patient's diagnosis is consistent with sciatica of the left side. Patient will be discharged home with prescriptions for promethazine, prednisone taper, baclofen, and tramadol to use as directed. Patient is to follow up with her primary care provider if symptoms persist past this treatment course. Patient is given ED precautions to return to the ED for any worsening or new symptoms.    ____________________________________________  FINAL CLINICAL IMPRESSION(S) / ED DIAGNOSES  Final diagnoses:  Sciatica of left side      NEW MEDICATIONS STARTED DURING THIS VISIT:  Discharge Medication List as of 04/24/2016 10:16 AM    START taking these medications    Details  baclofen (LIORESAL) 10 MG tablet Take 1 tablet (10 mg total) by mouth 3 (three) times daily., Starting Fri 04/24/2016, Until Fri 05/01/2016, Print    predniSONE (DELTASONE) 10 MG tablet Take a daily regimen of 6,5,4,3,2,1, Print    promethazine (PHENERGAN) 12.5 MG tablet Take 1 tablet (12.5 mg total) by mouth every 6 (six) hours as needed for nausea or vomiting., Starting Fri 04/24/2016, Until Fri 05/01/2016, Print    traMADol (ULTRAM) 50 MG tablet Take 1 tablet (50 mg total) by mouth every 6 (six) hours as needed., Starting Fri 04/24/2016, Print             Ernestene Kiel Gayle Mill, PA-C 04/24/16 1035    Emily Filbert, MD 04/24/16 1217

## 2016-04-24 NOTE — ED Notes (Signed)
Patient presents with left sided back pain with radiation into her thigh and lower leg. Patient states she has had this before but "never this bad".

## 2016-06-10 ENCOUNTER — Emergency Department: Payer: Self-pay

## 2016-06-10 ENCOUNTER — Emergency Department
Admission: EM | Admit: 2016-06-10 | Discharge: 2016-06-11 | Disposition: A | Discharge status: Home / Self Care | Payer: Self-pay | Attending: Student in an Organized Health Care Education/Training Program | Admitting: Student in an Organized Health Care Education/Training Program

## 2016-06-10 ENCOUNTER — Encounter: Payer: Self-pay | Admitting: Emergency Medicine

## 2016-06-10 DIAGNOSIS — R1013 Epigastric pain: Secondary | ICD-10-CM | POA: Insufficient documentation

## 2016-06-10 DIAGNOSIS — I10 Essential (primary) hypertension: Secondary | ICD-10-CM | POA: Insufficient documentation

## 2016-06-10 DIAGNOSIS — R109 Unspecified abdominal pain: Secondary | ICD-10-CM

## 2016-06-10 DIAGNOSIS — F17210 Nicotine dependence, cigarettes, uncomplicated: Secondary | ICD-10-CM | POA: Insufficient documentation

## 2016-06-10 DIAGNOSIS — Z79899 Other long term (current) drug therapy: Secondary | ICD-10-CM | POA: Insufficient documentation

## 2016-06-10 DIAGNOSIS — R112 Nausea with vomiting, unspecified: Secondary | ICD-10-CM | POA: Insufficient documentation

## 2016-06-10 LAB — BASIC METABOLIC PANEL
ANION GAP: 8 (ref 5–15)
BUN: 9 mg/dL (ref 6–20)
CHLORIDE: 102 mmol/L (ref 101–111)
CO2: 27 mmol/L (ref 22–32)
Calcium: 10.1 mg/dL (ref 8.9–10.3)
Creatinine, Ser: 0.71 mg/dL (ref 0.44–1.00)
GFR calc Af Amer: >60 mL/min (ref >60)
GLUCOSE: 97 mg/dL (ref 65–99)
POTASSIUM: 3.3 mmol/L — AB (ref 3.5–5.1)
Sodium: 137 mmol/L (ref 135–145)

## 2016-06-10 LAB — HEPATIC FUNCTION PANEL
ALT: 33 U/L (ref 14–54)
AST: 32 U/L (ref 15–41)
Albumin: 3.7 g/dL (ref 3.5–5.0)
Alkaline Phosphatase: 80 U/L (ref 38–126)
BILIRUBIN TOTAL: 1 mg/dL (ref 0.3–1.2)
Total Protein: 7.7 g/dL (ref 6.5–8.1)

## 2016-06-10 LAB — CBC WITH DIFFERENTIAL/PLATELET
BASOS ABS: 0.2 10*3/uL — AB (ref 0–0.1)
Basophils Relative: 1 %
Eosinophils Absolute: 0.3 10*3/uL (ref 0–0.7)
Eosinophils Relative: 2 %
HCT: 40.8 % (ref 35.0–47.0)
Hemoglobin: 13.8 g/dL (ref 12.0–16.0)
LYMPHS ABS: 5.2 10*3/uL — AB (ref 1.0–3.6)
LYMPHS PCT: 38 %
MCH: 32.2 pg (ref 26.0–34.0)
MCHC: 33.8 g/dL (ref 32.0–36.0)
MCV: 95.2 fL (ref 80.0–100.0)
MONO ABS: 1 10*3/uL — AB (ref 0.2–0.9)
Monocytes Relative: 7 %
NEUTROS ABS: 7.2 10*3/uL — AB (ref 1.4–6.5)
Neutrophils Relative %: 52 %
Platelets: 360 10*3/uL (ref 150–440)
RBC: 4.29 MIL/uL (ref 3.80–5.20)
RDW: 14.6 % — AB (ref 11.5–14.5)
WBC: 14 10*3/uL — ABNORMAL HIGH (ref 3.6–11.0)

## 2016-06-10 LAB — URINALYSIS COMPLETE WITH MICROSCOPIC (ARMC ONLY)
BILIRUBIN URINE: NEGATIVE
GLUCOSE, UA: NEGATIVE mg/dL
Hgb urine dipstick: NEGATIVE
KETONES UR: NEGATIVE mg/dL
Leukocytes, UA: NEGATIVE
NITRITE: NEGATIVE
PROTEIN: NEGATIVE mg/dL
SPECIFIC GRAVITY, URINE: 1.011 (ref 1.005–1.030)
pH: 5 (ref 5.0–8.0)

## 2016-06-10 LAB — LIPASE, BLOOD: Lipase: 22 U/L (ref 11–51)

## 2016-06-10 LAB — POCT PREGNANCY, URINE: Preg Test, Ur: NEGATIVE

## 2016-06-10 MED ORDER — MORPHINE SULFATE (PF) 4 MG/ML IV SOLN
4.0000 mg | INTRAVENOUS | Status: DC | PRN
  Administered 2016-06-10: 4 mg via INTRAVENOUS
  Filled 2016-06-10: qty 1

## 2016-06-10 MED ORDER — PROMETHAZINE HCL 25 MG/ML IJ SOLN
12.5000 mg | Freq: Once | INTRAMUSCULAR | Status: AC
  Administered 2016-06-10: 12.5 mg via INTRAVENOUS
  Filled 2016-06-10: qty 1

## 2016-06-10 MED ORDER — SODIUM CHLORIDE 0.9 % IV BOLUS (SEPSIS)
1000.0000 mL | Freq: Once | INTRAVENOUS | Status: AC
  Administered 2016-06-10: 1000 mL via INTRAVENOUS

## 2016-06-10 NOTE — ED Triage Notes (Signed)
Patient ambulatory to triage with steady gait, without difficulty, appears uncomfortable; pt reports seen at [facility] Monday for kidney stones; c/o left flank/abd pain with N/V & chills

## 2016-06-10 NOTE — ED Provider Notes (Signed)
[facility] Emergency Department Provider Note    First MD Initiated Contact with Patient 06/10/16 2118     (approximate)  I have reviewed the triage vital signs and the nursing notes.   HISTORY  Chief Complaint Flank Pain    HPI Chelsea Nolan is a 35 y.o. female with a history of psoriasis and reported kidney stones status post hysterectomy presents with left flank pain for the past several days. Patient was seen at [facility] emergency Department on Monday. She was scanned at that point which showed no evidence of acute intra-abdominal process. She did have large blood on her urinalysis was told the pain might be secondary to passed kidney stone. Since then she still had a progressively worsening pain but now states it's more located in the epigastrium. Denies any lower abdominal pain. No diarrhea. Is having nausea with nonbilious nonbloody vomiting.   Past Medical History:  Diagnosis Date  . Arthritis   . Hypertension   . Kidney stones   . Obesity   . Psoriatic arthritis [facility])    Dermatologist - Dr. Adolphus Birchwood, Rheumatologist - Dr. Gavin Potters  . Rheumatoid arthritis (HCC)    Family History  Problem Relation Age of Onset  . Diabetes Mother   . Hyperlipidemia Mother   . Hypertension Mother   . Arthritis Mother   . Lupus Mother   . Obesity Sister   . Cancer Maternal Grandmother     ovarian   Past Surgical History:  Procedure Laterality Date  . CESAREAN SECTION     pre-eclampsia  . LITHOTRIPSY    . VAGINAL DELIVERY     Patient Active Problem List   Diagnosis Date Noted  . Hyperlipidemia 03/18/2016  . Chest pain 02/05/2016  . Migraine with aura 01/16/2015  . Renal stones 05/17/2014  . PTSD (post-traumatic stress disorder) 04/02/2014  . Family history of ovarian cancer 11/15/2013  . Psoriatic arthritis (HCC) 01/26/2013  . Essential hypertension, benign 01/26/2013  . Obesity, unspecified 01/26/2013      Prior to Admission medications     Medication Sig Start Date End Date Taking? Authorizing Provider  alprazolam Prudy Feeler) 2 MG tablet Take 2 mg by mouth 4 (four) times daily.    Historical Provider, MD  butalbital-acetaminophen-caffeine (FIORICET, ESGIC) [phone] MG tablet Take 1 tablet by mouth 2 (two) times daily as needed for headache. 03/18/16   Tommie Sams, DO  Cholecalciferol (VITAMIN D) 2000 UNITS CAPS Take 1 capsule by mouth daily.     Historical Provider, MD  dicyclomine (BENTYL) 20 MG tablet Take 1 tablet (20 mg total) by mouth every 8 (eight) hours as needed for spasms. 06/11/16 06/11/17  Willy Eddy, MD  FLUoxetine (PROZAC) 40 MG capsule Take 40 mg by mouth daily.    Historical Provider, MD  HYDROcodone-acetaminophen (NORCO) 5-325 MG tablet Take 2 tablets by mouth every 6 (six) hours as needed for moderate pain. 06/11/16   Willy Eddy, MD  loratadine (CLARITIN) 10 MG tablet Take 10 mg by mouth daily as needed for allergies.     Historical Provider, MD  Methotrexate, PF, 25 MG/0.5ML SOAJ  12/21/14   Historical Provider, MD  prazosin (MINIPRESS) 2 MG capsule Take 2-4 mg by mouth at bedtime. Pt takes 1 to 2 capsules by mouth at bedtime.    Historical Provider, MD  predniSONE (DELTASONE) 10 MG tablet Take a daily regimen of 6,5,4,3,2,1 04/24/16   Jami L Hagler, PA-C  promethazine (PHENERGAN) 12.5 MG tablet Take 1 tablet (12.5 mg total)  by mouth every 6 (six) hours as needed for nausea or vomiting. 04/24/16 05/01/16  Jami L Hagler, PA-C  promethazine (PHENERGAN) 12.5 MG tablet Take 1 tablet (12.5 mg total) by mouth every 6 (six) hours as needed for nausea or vomiting. 06/11/16   Willy Eddy, MD  traMADol (ULTRAM) 50 MG tablet Take 1 tablet (50 mg total) by mouth every 6 (six) hours as needed. 04/24/16   Jami L Hagler, PA-C    Allergies Penicillins; Enbrel [etanercept]; Flexeril [cyclobenzaprine]; Prednisone; Sertraline; Topamax [topiramate]; Amoxicillin; and Zofran [facility] hcl]    Social History Social History   Substance Use Topics  . Smoking status: Current Every Day Smoker    Packs/day: 0.50    Years: 15.00    Types: Cigarettes  . Smokeless tobacco: Never Used  . Alcohol use No    Review of Systems Patient denies headaches, rhinorrhea, blurry vision, numbness, shortness of breath, chest pain, edema, cough, abdominal pain, nausea, vomiting, diarrhea, dysuria, fevers, rashes or hallucinations unless otherwise stated above in HPI. ____________________________________________   PHYSICAL EXAM:  VITAL SIGNS: Vitals:   06/10/16 2150 06/10/16 2200  BP: (!) 141/84 (!) 134/95  Pulse: 84 85  Resp:    Temp:      Constitutional: Alert and oriented. in no acute distress. Eyes: Conjunctivae are normal. PERRL. EOMI. Head: Atraumatic. Nose: No congestion/rhinnorhea. Mouth/Throat: Mucous membranes are moist.  Oropharynx non-erythematous. Neck: No stridor. Painless ROM. No cervical spine tenderness to palpation Hematological/Lymphatic/Immunilogical: No cervical lymphadenopathy. Cardiovascular: Normal rate, regular rhythm. Grossly normal heart sounds.  Good peripheral circulation. Respiratory: Normal respiratory effort.  No retractions. Lungs CTAB. Gastrointestinal: Soft and nontender. No distention. No abdominal bruits. No CVA tenderness. Musculoskeletal: No lower extremity tenderness nor edema.  No joint effusions. Neurologic:  Normal speech and language. No gross focal neurologic deficits are appreciated. No gait instability. Skin:  Skin is warm, dry and intact. Diffuse psoriatic plaques Psychiatric: Mood and affect are normal. Speech and behavior are normal.  ____________________________________________   LABS (all labs ordered are listed, but only abnormal results are displayed)  Results for orders placed or performed during the hospital encounter of 06/10/16 (from the past 24 hour(s))  Basic metabolic panel     Status: Abnormal   Collection Time: 06/10/16  9:22 PM  Result Value Ref  Range   Sodium 137 135 - 145 mmol/L   Potassium 3.3 (L) 3.5 - 5.1 mmol/L   Chloride 102 101 - 111 mmol/L   CO2 27 22 - 32 mmol/L   Glucose, Bld 97 65 - 99 mg/dL   BUN 9 6 - 20 mg/dL   Creatinine, Ser 3.29 0.44 - 1.00 mg/dL   Calcium 19.1 8.9 - 66.0 mg/dL   GFR calc non Af Amer >60 >60 mL/min   GFR calc Af Amer >60 >60 mL/min   Anion gap 8 5 - 15  CBC with Differential/Platelet     Status: Abnormal   Collection Time: 06/10/16  9:22 PM  Result Value Ref Range   WBC 14.0 (H) 3.6 - 11.0 K/uL   RBC 4.29 3.80 - 5.20 MIL/uL   Hemoglobin 13.8 12.0 - 16.0 g/dL   HCT 60.0 45.9 - 97.7 %   MCV 95.2 80.0 - 100.0 fL   MCH 32.2 26.0 - 34.0 pg   MCHC 33.8 32.0 - 36.0 g/dL   RDW 41.4 (H) 23.9 - 53.2 %   Platelets 360 150 - 440 K/uL   Neutrophils Relative % 52 %   Neutro Abs 7.2 (H) 1.4 -  6.5 K/uL   Lymphocytes Relative 38 %   Lymphs Abs 5.2 (H) 1.0 - 3.6 K/uL   Monocytes Relative 7 %   Monocytes Absolute 1.0 (H) 0.2 - 0.9 K/uL   Eosinophils Relative 2 %   Eosinophils Absolute 0.3 0 - 0.7 K/uL   Basophils Relative 1 %   Basophils Absolute 0.2 (H) 0 - 0.1 K/uL  Hepatic function panel     Status: Abnormal   Collection Time: 06/10/16  9:22 PM  Result Value Ref Range   Total Protein 7.7 6.5 - 8.1 g/dL   Albumin 3.7 3.5 - 5.0 g/dL   AST 32 15 - 41 U/L   ALT 33 14 - 54 U/L   Alkaline Phosphatase 80 38 - 126 U/L   Total Bilirubin 1.0 0.3 - 1.2 mg/dL   Bilirubin, Direct <2.6 (L) 0.1 - 0.5 mg/dL   Indirect Bilirubin NOT CALCULATED 0.3 - 0.9 mg/dL  Lipase, blood     Status: None   Collection Time: 06/10/16  9:22 PM  Result Value Ref Range   Lipase 22 11 - 51 U/L  Urinalysis complete, with microscopic ([facility] only)     Status: Abnormal   Collection Time: 06/10/16 11:29 PM  Result Value Ref Range   Color, Urine YELLOW (A) YELLOW   APPearance CLEAR (A) CLEAR   Glucose, UA NEGATIVE NEGATIVE mg/dL   Bilirubin Urine NEGATIVE NEGATIVE   Ketones, ur NEGATIVE NEGATIVE mg/dL   Specific Gravity,  Urine 1.011 1.005 - 1.030   Hgb urine dipstick NEGATIVE NEGATIVE   pH 5.0 5.0 - 8.0   Protein, ur NEGATIVE NEGATIVE mg/dL   Nitrite NEGATIVE NEGATIVE   Leukocytes, UA NEGATIVE NEGATIVE   RBC / HPF 0-5 0 - 5 RBC/hpf   WBC, UA 0-5 0 - 5 WBC/hpf   Bacteria, UA RARE (A) NONE SEEN   Squamous Epithelial / LPF 0-5 (A) NONE SEEN   Mucous PRESENT   Pregnancy, urine POC     Status: None   Collection Time: 06/10/16 11:37 PM  Result Value Ref Range   Preg Test, Ur NEGATIVE NEGATIVE   ____________________________________________  EKG__________________________________  RADIOLOGY  I personally reviewed all radiographic images ordered to evaluate for the above acute complaints and reviewed radiology reports and findings.  These findings were personally discussed with the patient.  Please see medical record for radiology report. ____________________________________________   PROCEDURES  Procedure(s) performed: none Procedures    Critical Care performed: no ____________________________________________   INITIAL IMPRESSION / ASSESSMENT AND PLAN / ED COURSE  Pertinent labs & imaging results that were available during my care of the patient were reviewed by me and considered in my medical decision making (see chart for details).  DDX: cholesystitis, cholelithasisi, gastritis, pancreatitis, pyelo, stone, uti, msk pain, viral illness,  Flu like illness  Chelsea Nolan is a 35 y.o. who presents to the ED with recurrent left flank pain and left epigastric pain. Patient afebrile hemodynamic stable. Patient uncomfortable appearing but in no acute distress. Recent CT scan showed no evidence of stone or reportedly had no intra-abdominal process. Based on my exam at this time I do not feel that repeat CT imaging clinically indicated due to concern for worsening radiation exposure and she does not have an abdomen that is consistent with acute surgical process. She has had a mild leukocytosis but does have  a lymphocytic predominance increasing the suspicion for possible viral illness with diffuse myalgias causing her discomfort. Given her obesity and reported epigastric pain with  nausea and vomiting will order ultrasound of the gallbladder. Ultrasound showed no evidence of acute gallbladder pathology. LFTs are normal. Her renal function is normal. Patient is on methotrexate but is not demonstrating any signs or symptoms of sepsis. UA does not show any evidence of infection or hematuria. She has no pelvic pain and she is status post hysterectomy. Patient was able to ambulate with steady gait was able to tolerate oral hydration. We'll discharge home with symptomatically management follow-up PCP.  Have discussed with the patient and available family all diagnostics and treatments performed thus far and all questions were answered to the best of my ability. The patient demonstrates understanding and agreement with plan.   Clinical Course      ____________________________________________   FINAL CLINICAL IMPRESSION(S) / ED DIAGNOSES  Final diagnoses:  Flank pain  Nausea and vomiting, intractability of vomiting not specified, unspecified vomiting type      NEW MEDICATIONS STARTED DURING THIS VISIT:  New Prescriptions   DICYCLOMINE (BENTYL) 20 MG TABLET    Take 1 tablet (20 mg total) by mouth every 8 (eight) hours as needed for spasms.   HYDROCODONE-ACETAMINOPHEN (NORCO) 5-325 MG TABLET    Take 2 tablets by mouth every 6 (six) hours as needed for moderate pain.   PROMETHAZINE (PHENERGAN) 12.5 MG TABLET    Take 1 tablet (12.5 mg total) by mouth every 6 (six) hours as needed for nausea or vomiting.     Note:  This document was prepared using Dragon voice recognition software and may include unintentional dictation errors.    Willy Eddy, MD 06/11/16 [phone]

## 2016-06-10 NOTE — ED Notes (Signed)
Pt up to restroom to provide urine sample at this time.

## 2016-06-11 MED ORDER — DICYCLOMINE HCL 20 MG PO TABS: 20.0000 mg | ORAL_TABLET | Freq: Three times a day (TID) | ORAL | 0 refills | Status: DC | PRN

## 2016-06-11 MED ORDER — HYDROCODONE-ACETAMINOPHEN 5-325 MG PO TABS: 2.0000 | ORAL_TABLET | Freq: Four times a day (QID) | ORAL | 0 refills | Status: DC | PRN

## 2016-06-11 MED ORDER — PROMETHAZINE HCL 12.5 MG PO TABS: 12.5000 mg | ORAL_TABLET | Freq: Four times a day (QID) | ORAL | 0 refills | Status: DC | PRN

## 2016-06-11 MED ORDER — PROMETHAZINE HCL 25 MG/ML IJ SOLN
12.5000 mg | Freq: Once | INTRAMUSCULAR | Status: AC
  Administered 2016-06-11: 12.5 mg via INTRAVENOUS
  Filled 2016-06-11: qty 1

## 2016-06-11 MED ORDER — HYDROCODONE-ACETAMINOPHEN 5-325 MG PO TABS
1.0000 | ORAL_TABLET | Freq: Once | ORAL | Status: AC
  Administered 2016-06-11: 1 via ORAL
  Filled 2016-06-11: qty 1

## 2016-06-11 NOTE — Discharge Instructions (Signed)

## 2016-06-28 ENCOUNTER — Encounter: Payer: Self-pay | Admitting: Family Medicine

## 2016-06-30 MED ORDER — BUTALBITAL-APAP-CAFFEINE 50-325-40 MG PO TABS: 1.0000 | ORAL_TABLET | Freq: Two times a day (BID) | ORAL | 1 refills | Status: DC | PRN

## 2016-06-30 NOTE — Telephone Encounter (Signed)
Rx faxed to [facility].

## 2016-06-30 NOTE — Telephone Encounter (Signed)
I have pended the medication. Last refill sent in 03/08/16. Last OV 03/18/16.

## 2016-07-03 ENCOUNTER — Ambulatory Visit (INDEPENDENT_AMBULATORY_CARE_PROVIDER_SITE_OTHER): Payer: Self-pay | Admitting: Psychology

## 2016-07-03 DIAGNOSIS — F4310 Post-traumatic stress disorder, unspecified: Secondary | ICD-10-CM

## 2016-07-07 ENCOUNTER — Ambulatory Visit (INDEPENDENT_AMBULATORY_CARE_PROVIDER_SITE_OTHER): Payer: PRIVATE HEALTH INSURANCE | Admitting: Psychology

## 2016-07-07 DIAGNOSIS — F4310 Post-traumatic stress disorder, unspecified: Secondary | ICD-10-CM | POA: Diagnosis not present

## 2016-07-14 ENCOUNTER — Ambulatory Visit (INDEPENDENT_AMBULATORY_CARE_PROVIDER_SITE_OTHER): Payer: PRIVATE HEALTH INSURANCE | Admitting: Psychology

## 2016-07-14 DIAGNOSIS — F4310 Post-traumatic stress disorder, unspecified: Secondary | ICD-10-CM | POA: Diagnosis not present

## 2016-07-21 ENCOUNTER — Ambulatory Visit (INDEPENDENT_AMBULATORY_CARE_PROVIDER_SITE_OTHER): Payer: PRIVATE HEALTH INSURANCE | Admitting: Psychology

## 2016-07-21 DIAGNOSIS — F4310 Post-traumatic stress disorder, unspecified: Secondary | ICD-10-CM

## 2016-08-04 ENCOUNTER — Ambulatory Visit: Payer: BLUE CROSS/BLUE SHIELD | Admitting: Psychology

## 2016-08-11 ENCOUNTER — Ambulatory Visit (INDEPENDENT_AMBULATORY_CARE_PROVIDER_SITE_OTHER): Payer: PRIVATE HEALTH INSURANCE | Admitting: Psychology

## 2016-08-11 DIAGNOSIS — F4310 Post-traumatic stress disorder, unspecified: Secondary | ICD-10-CM

## 2016-08-12 IMAGING — DX DG CERVICAL SPINE COMPLETE 4+V
5 series · 6 of 6 positions shown · non-contrast
Comparison: None.

CLINICAL DATA: Right-sided neck pain radiating into right upper
extremity with associated numbness and tingling of the right arm and
fingers.

EXAM:
CERVICAL SPINE - COMPLETE 4+ VIEW

[c-spine lat]
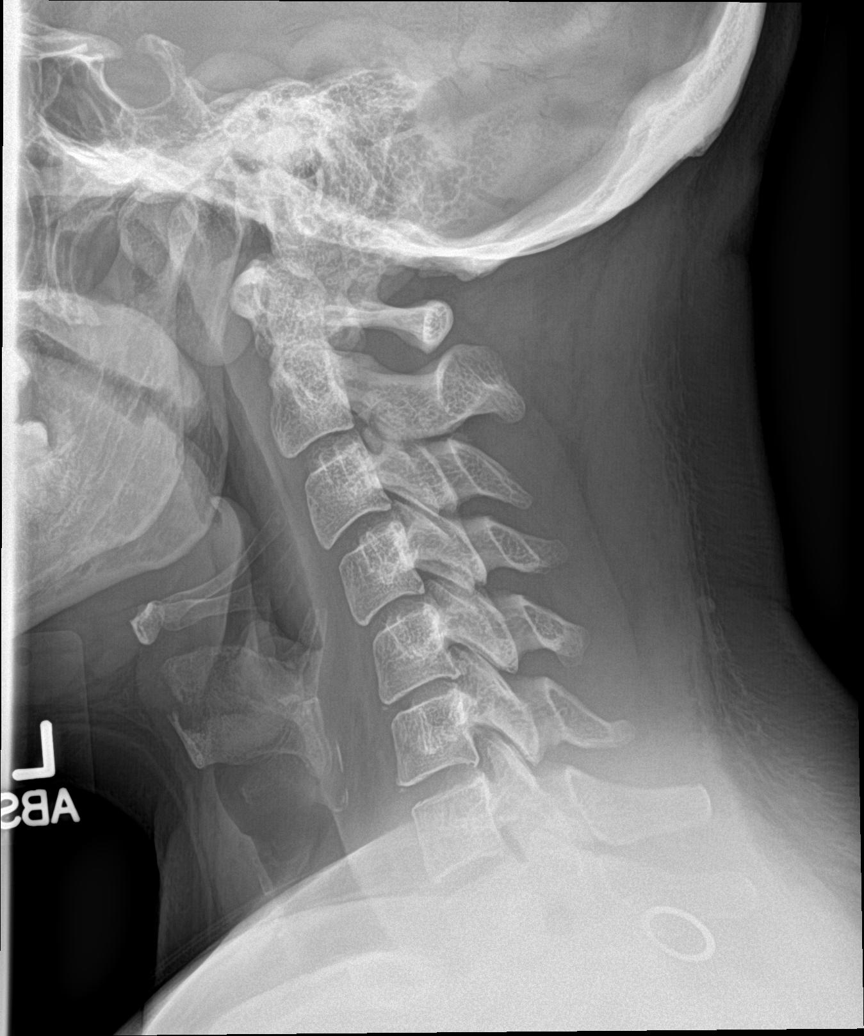

[c-spine obl (1 of 2)]
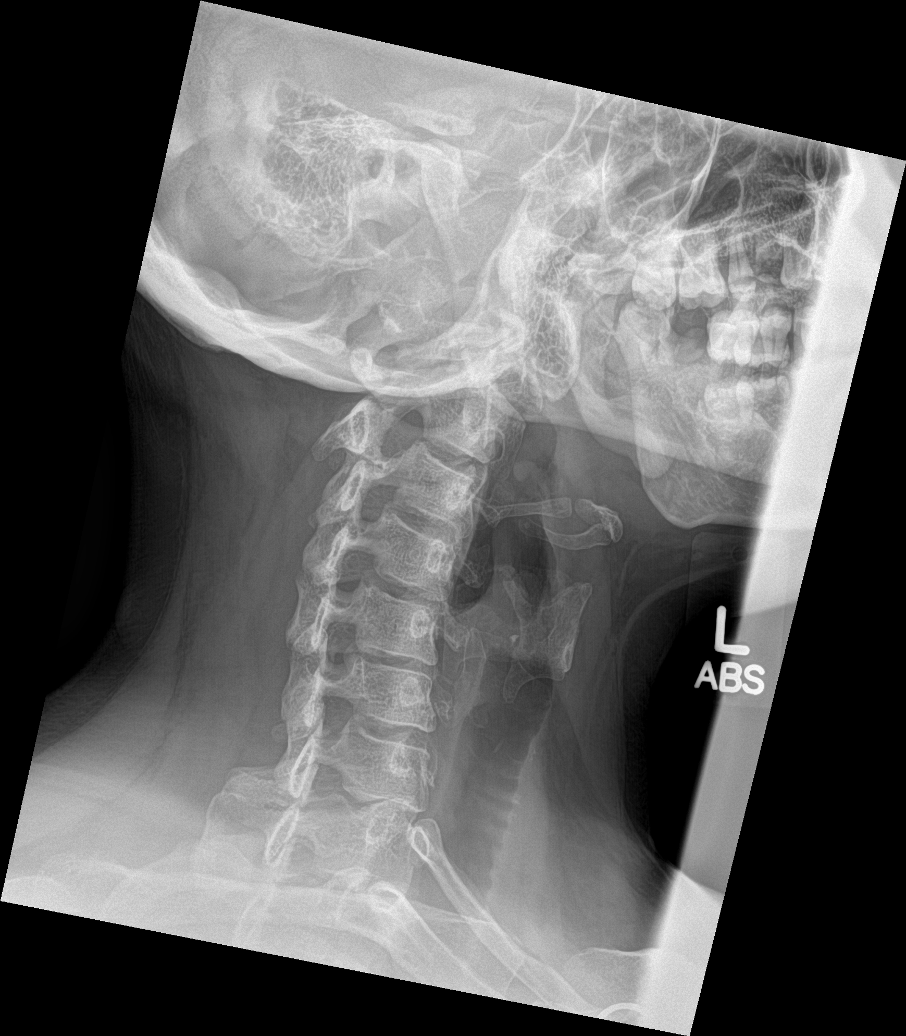

[c-spine obl (2 of 2)]
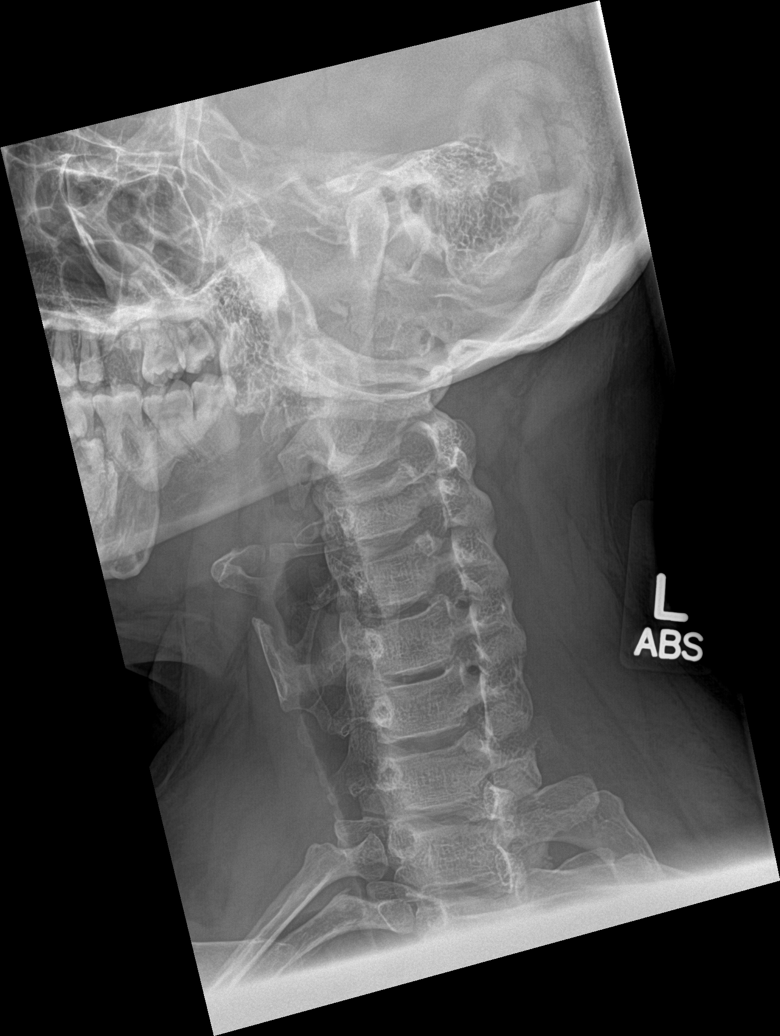

[c-spine ap]
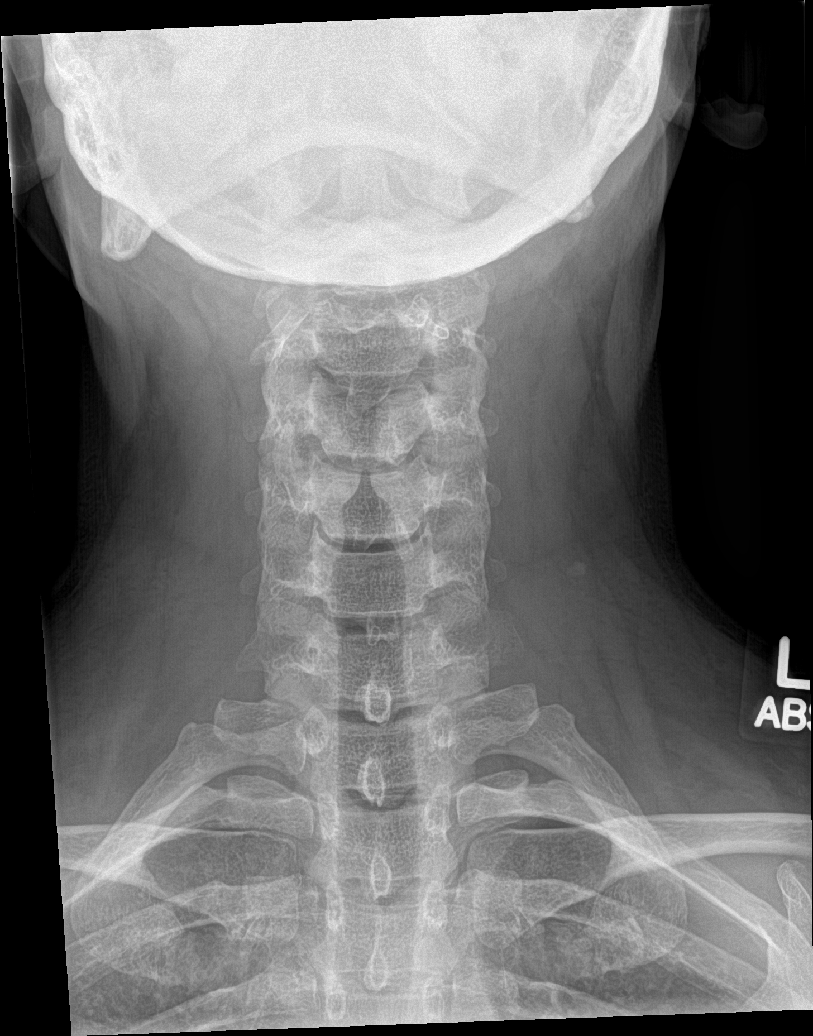

[Series 5: c-spine open mouth · 0.14mm/px · 2 of 2 slices shown]
[im 1/2]
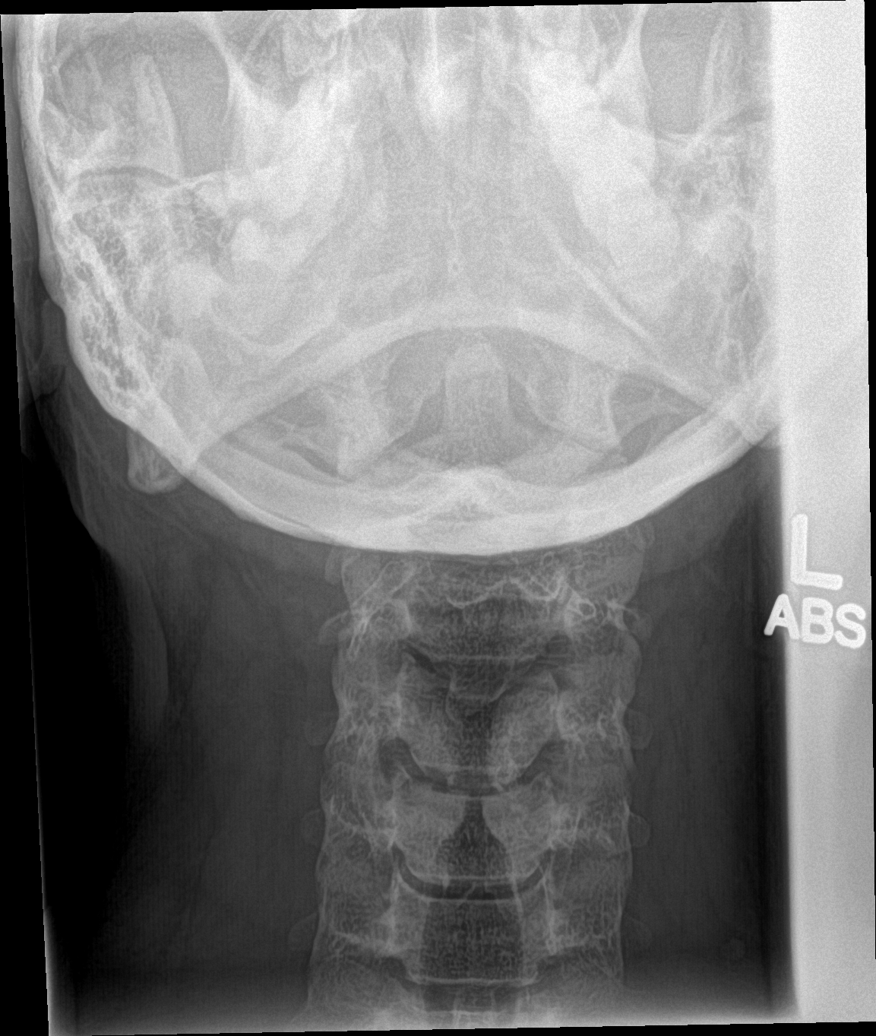
[im 2/2]
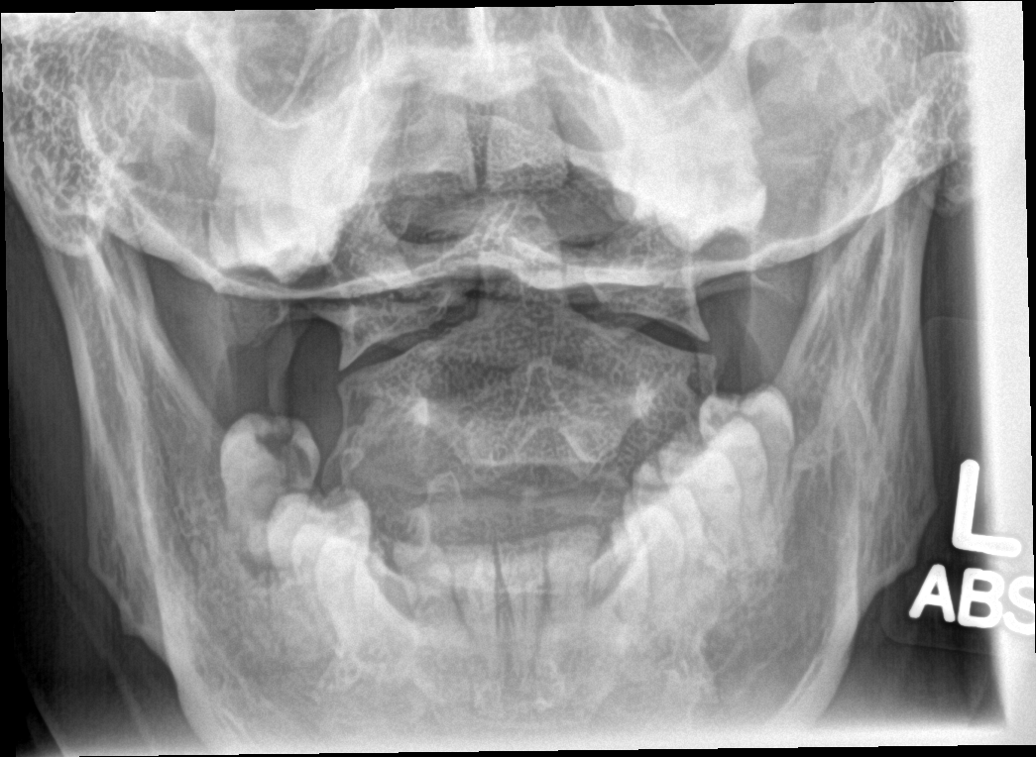

[6 of 6 positions shown; findings below may reference images not displayed]

FINDINGS: There is some loss of cervical lordosis. Minimal uncovertebral
proliferative changes are present at C5-6 and C6-7. No evidence of
bony canal stenosis, subluxation, fracture or soft tissue swelling.
No bony lesions are seen.
IMPRESSION: Minimal cervical spondylosis at C5-6 and C6-7.

## 2016-08-18 ENCOUNTER — Ambulatory Visit (INDEPENDENT_AMBULATORY_CARE_PROVIDER_SITE_OTHER): Payer: PRIVATE HEALTH INSURANCE | Admitting: Psychology

## 2016-08-18 DIAGNOSIS — F4310 Post-traumatic stress disorder, unspecified: Secondary | ICD-10-CM | POA: Diagnosis not present

## 2016-08-20 ENCOUNTER — Encounter: Payer: Self-pay | Admitting: Emergency Medicine

## 2016-08-20 ENCOUNTER — Emergency Department: Payer: BLUE CROSS/BLUE SHIELD

## 2016-08-20 ENCOUNTER — Emergency Department
Admission: EM | Admit: 2016-08-20 | Discharge: 2016-08-20 | Disposition: A | Discharge status: Home / Self Care | Payer: BLUE CROSS/BLUE SHIELD | Attending: Emergency Medicine | Admitting: Emergency Medicine

## 2016-08-20 DIAGNOSIS — F17210 Nicotine dependence, cigarettes, uncomplicated: Secondary | ICD-10-CM | POA: Insufficient documentation

## 2016-08-20 DIAGNOSIS — J189 Pneumonia, unspecified organism: Secondary | ICD-10-CM | POA: Insufficient documentation

## 2016-08-20 DIAGNOSIS — I10 Essential (primary) hypertension: Secondary | ICD-10-CM | POA: Diagnosis not present

## 2016-08-20 DIAGNOSIS — Z79899 Other long term (current) drug therapy: Secondary | ICD-10-CM | POA: Diagnosis not present

## 2016-08-20 DIAGNOSIS — R0602 Shortness of breath: Secondary | ICD-10-CM | POA: Diagnosis present

## 2016-08-20 LAB — BASIC METABOLIC PANEL
Anion gap: 8 (ref 5–15)
BUN: 12 mg/dL (ref 6–20)
CALCIUM: 9.1 mg/dL (ref 8.9–10.3)
CO2: 26 mmol/L (ref 22–32)
CREATININE: 0.76 mg/dL (ref 0.44–1.00)
Chloride: 103 mmol/L (ref 101–111)
GFR calc Af Amer: >60 mL/min (ref >60)
Glucose, Bld: 115 mg/dL — ABNORMAL HIGH (ref 65–99)
Potassium: 3.4 mmol/L — ABNORMAL LOW (ref 3.5–5.1)
SODIUM: 137 mmol/L (ref 135–145)

## 2016-08-20 LAB — CBC
HCT: 41.8 % (ref 35.0–47.0)
Hemoglobin: 14.5 g/dL (ref 12.0–16.0)
MCH: 33.5 pg (ref 26.0–34.0)
MCHC: 34.6 g/dL (ref 32.0–36.0)
MCV: 96.9 fL (ref 80.0–100.0)
PLATELETS: 313 10*3/uL (ref 150–440)
RBC: 4.31 MIL/uL (ref 3.80–5.20)
RDW: 15.2 % — AB (ref 11.5–14.5)
WBC: 12.2 10*3/uL — AB (ref 3.6–11.0)

## 2016-08-20 LAB — TROPONIN I

## 2016-08-20 MED ORDER — PREDNISONE 20 MG PO TABS
60.0000 mg | ORAL_TABLET | Freq: Once | ORAL | Status: DC
  Filled 2016-08-20: qty 3

## 2016-08-20 MED ORDER — ALBUTEROL SULFATE HFA 108 (90 BASE) MCG/ACT IN AERS: 2.0000 | INHALATION_SPRAY | Freq: Four times a day (QID) | RESPIRATORY_TRACT | 2 refills | Status: DC | PRN

## 2016-08-20 MED ORDER — AZITHROMYCIN 250 MG PO TABS: ORAL_TABLET | ORAL | 0 refills | Status: AC

## 2016-08-20 MED ORDER — HYDROCODONE-ACETAMINOPHEN 5-325 MG PO TABS: 1.0000 | ORAL_TABLET | ORAL | 0 refills | Status: DC | PRN

## 2016-08-20 MED ORDER — IPRATROPIUM-ALBUTEROL 0.5-2.5 (3) MG/3ML IN SOLN
3.0000 mL | Freq: Once | RESPIRATORY_TRACT | Status: AC
  Administered 2016-08-20: 3 mL via RESPIRATORY_TRACT
  Filled 2016-08-20: qty 3

## 2016-08-20 MED ORDER — PREDNISONE 10 MG PO TABS: 10.0000 mg | ORAL_TABLET | Freq: Every day | ORAL | 0 refills | Status: DC

## 2016-08-20 MED ORDER — IOPAMIDOL (ISOVUE-370) INJECTION 76%
75.0000 mL | Freq: Once | INTRAVENOUS | Status: AC | PRN
  Administered 2016-08-20: 75 mL via INTRAVENOUS

## 2016-08-20 MED ORDER — IPRATROPIUM-ALBUTEROL 0.5-2.5 (3) MG/3ML IN SOLN: 3.0000 mL | Freq: Once | RESPIRATORY_TRACT | Status: DC

## 2016-08-20 MED ORDER — ALBUTEROL SULFATE (2.5 MG/3ML) 0.083% IN NEBU
5.0000 mg | INHALATION_SOLUTION | Freq: Once | RESPIRATORY_TRACT | Status: AC
  Administered 2016-08-20: 5 mg via RESPIRATORY_TRACT
  Filled 2016-08-20: qty 6

## 2016-08-20 NOTE — Discharge Instructions (Signed)
Please take the Zithromax as directed. Use the albuterol inhaler 2 puffs 4 times a day as needed for shortness of breath and cough. If you need more medicine for the cough for Robitussin over-the-counter. I'll give you some Vicodin one pill 4 times a day for the pain with the cough. It might help with the coughing as well. Be careful the Vicodin can make you sleepy, do not drive on it. Please follow-up with your doctor or [facility] acute-care or the [facility] or the [place] [facility] or the [facility] clinic or [place] [facility] in 2-3 days. Please return if you're worse at all particularly if you get anywhere close to her short of breath that she were on arrival.

## 2016-08-20 NOTE — ED Triage Notes (Addendum)
Pt presents to ED with c/o shortness of breath that started last night. Pt is noted to have labored breathing at this time. Pt is unable to speak in complete sentences. Pt c/o somewhat productive cough at this time. Pt also c/o chest tightness at this time from coughing.

## 2016-08-20 NOTE — ED Notes (Signed)
Pt resting on stretcher with family at the bedside. Pt alert with no distress noted at this time. States she is feeling better. Breathing tx started and pt aware of pending discharge once completed. Will continue to assess.

## 2016-08-20 NOTE — ED Provider Notes (Signed)
[facility] Emergency Department Provider Note   ____________________________________________   First MD Initiated Contact with Patient 08/20/16 1508     (approximate)  I have reviewed the triage vital signs and the nursing notes.   HISTORY  Chief Complaint Shortness of Breath    HPI Chelsea Nolan is a 36 y.o. female who comes in complaining of coughing and shortness of breath starting yesterday. Patient says short of breath she cannot complete a whole sentence. O2 sats in the emergency room and dropped down to 88 on room air. This is after the first nebulizer treatment. Patient reports she occasionally brings up small amounts of yellow and green sputum. She denies having a fever. She is a smoker and has arthritis but no other medical problems she says.   Past Medical History:  Diagnosis Date  . Arthritis   . Hypertension   . Kidney stones   . Obesity   . Psoriatic arthritis [facility])    Dermatologist - Dr. Adolphus Birchwood, Rheumatologist - Dr. Gavin Potters  . Rheumatoid arthritis [facility])     Patient Active Problem List   Diagnosis Date Noted  . Hyperlipidemia 03/18/2016  . Chest pain 02/05/2016  . Migraine with aura 01/16/2015  . Renal stones 05/17/2014  . PTSD (post-traumatic stress disorder) 04/02/2014  . Family history of ovarian cancer 11/15/2013  . Psoriatic arthritis (HCC) 01/26/2013  . Essential hypertension, benign 01/26/2013  . Obesity, unspecified 01/26/2013    Past Surgical History:  Procedure Laterality Date  . CESAREAN SECTION     pre-eclampsia  . LITHOTRIPSY    . VAGINAL DELIVERY      Prior to Admission medications   Medication Sig Start Date End Date Taking? Authorizing Provider  Apremilast 30 MG TABS Take 30 mg by mouth 2 (two) times daily. 07/17/16  Yes Historical Provider, MD  butalbital-acetaminophen-caffeine (FIORICET, ESGIC) 50-325-40 MG tablet Take 1 tablet by mouth 2 (two) times daily as needed for headache. 06/30/16  Yes Tommie Sams, DO  Cholecalciferol (VITAMIN D) 2000 UNITS CAPS Take 1 capsule by mouth daily.    Yes Historical Provider, MD  clonazePAM (KLONOPIN) 2 MG tablet Take 2 mg by mouth 3 (three) times daily as needed. 07/30/16  Yes Historical Provider, MD  loratadine (CLARITIN) 10 MG tablet Take 10 mg by mouth daily as needed for allergies.    Yes Historical Provider, MD  Methotrexate, PF, 25 MG/0.5ML SOAJ Inject 25 mg into the skin every Thursday.  12/21/14  Yes Historical Provider, MD  prazosin (MINIPRESS) 2 MG capsule Take 2-4 mg by mouth at bedtime. Pt takes 1 to 2 capsules by mouth at bedtime.   Yes Historical Provider, MD  tamsulosin (FLOMAX) 0.4 MG CAPS capsule Take 0.4 mg by mouth as needed.   Yes Historical Provider, MD  dicyclomine (BENTYL) 20 MG tablet Take 1 tablet (20 mg total) by mouth every 8 (eight) hours as needed for spasms. Patient not taking: Reported on 08/20/2016 06/11/16 06/11/17  Willy Eddy, MD  HYDROcodone-acetaminophen [facility]) 5-325 MG tablet Take 2 tablets by mouth every 6 (six) hours as needed for moderate pain. Patient not taking: Reported on 08/20/2016 06/11/16   Willy Eddy, MD  predniSONE (DELTASONE) 10 MG tablet Take a daily regimen of 6,5,4,3,2,1 Patient not taking: Reported on 08/20/2016 04/24/16   Jami L Hagler, PA-C  promethazine (PHENERGAN) 12.5 MG tablet Take 1 tablet (12.5 mg total) by mouth every 6 (six) hours as needed for nausea or vomiting. 04/24/16 05/01/16  Jami L Hagler,  PA-C  promethazine (PHENERGAN) 12.5 MG tablet Take 1 tablet (12.5 mg total) by mouth every 6 (six) hours as needed for nausea or vomiting. Patient not taking: Reported on 08/20/2016 06/11/16   Willy Eddy, MD  traMADol (ULTRAM) 50 MG tablet Take 1 tablet (50 mg total) by mouth every 6 (six) hours as needed. Patient not taking: Reported on 08/20/2016 04/24/16   Jami L Hagler, PA-C    Allergies Penicillins; Enbrel [etanercept]; Flexeril [cyclobenzaprine]; Prednisone; Sertraline; Topamax  [topiramate]; Amoxicillin; and Zofran [ondansetron hcl]  Family History  Problem Relation Age of Onset  . Diabetes Mother   . Hyperlipidemia Mother   . Hypertension Mother   . Arthritis Mother   . Lupus Mother   . Obesity Sister   . Cancer Maternal Grandmother     ovarian    Social History Social History  Substance Use Topics  . Smoking status: Current Every Day Smoker    Packs/day: 0.50    Years: 15.00    Types: Cigarettes  . Smokeless tobacco: Never Used  . Alcohol use No    Review of Systems     Constitutional: No fever/chills Eyes: No visual changes. ENT: No sore throat. Cardiovascular: Chest pain under the right breast with coughing only Respiratory shortness of breath. Gastrointestinal: No abdominal pain.  No nausea, no vomiting.  No diarrhea.  No constipation. Genitourinary: Negative for dysuria. Musculoskeletal: Negative for back pain. Skin: Negative for rash. Neurological: Negative for headaches, focal weakness or numbness.  10-point ROS otherwise negative.  ____________________________________________   PHYSICAL EXAM:  VITAL SIGNS: ED Triage Vitals  Enc Vitals Group     BP 08/20/16 1358 (!) 144/109     Pulse Rate 08/20/16 1358 83     Resp 08/20/16 1358 (!) 26     Temp 08/20/16 1358 98.5 F (36.9 C)     Temp Source 08/20/16 1358 Oral     SpO2 08/20/16 1358 99 %     Weight 08/20/16 1359 200 lb (90.7 kg)     Height 08/20/16 1359 5\' 2"  (1.575 m)     Head Circumference --      Peak Flow --      Pain Score 08/20/16 1359 7     Pain Loc --      Pain Edu? --      Excl. in GC? --     Constitutional: Alert and oriented. Ill-appearing  Eyes: Conjunctivae are normal. PERRL. EOMI. Head: Atraumatic. Nose: No congestion/rhinnorhea. Mouth/Throat: Mucous membranes are moist.  Oropharynx non-erythematous. Neck: No stridor Cardiovascular: Normal rate, regular rhythm. Grossly normal heart sounds.  Good peripheral circulation. Respiratory: Increased  respiratory effort. retractions. Lungs CTAB. Gastrointestinal: Soft and nontender. No distention. No abdominal bruits. No CVA tenderness. }Musculoskeletal: No lower extremity tenderness nor edema.  No joint effusions. Neurologic:  Normal speech and language. No gross focal neurologic deficits are appreciated. No gait instability. Skin:  Skin is warm, dry and intact. No rash noted.  ____________________________________________   LABS (all labs ordered are listed, but only abnormal results are displayed)  Labs Reviewed  BASIC METABOLIC PANEL - Abnormal; Notable for the following:       Result Value   Potassium 3.4 (*)    Glucose, Bld 115 (*)    All other components within normal limits  CBC - Abnormal; Notable for the following:    WBC 12.2 (*)    RDW 15.2 (*)    All other components within normal limits  TROPONIN I  PREGNANCY, URINE  ____________________________________________  EKG  EKG read and interpreted by me shows normal sinus rhythm rate of 81 normal axis no acute ST-T wave changes ____________________________________________  RADIOLOGY  Study Result   CLINICAL DATA:  Pt presents to ED with c/o shortness of breath that started last night. Pt is noted to have labored breathing at this time. Pt is unable to speak in complete sentences. Pt c/o somewhat productive cough at this time. Pt also c/o chest tightness at this time from coughing.Smoker, htn  EXAM: CHEST  2 VIEW  COMPARISON:  03/01/2016  FINDINGS: The heart size and mediastinal contours are within normal limits. Both lungs are clear. No pleural effusion or pneumothorax. The visualized skeletal structures are unremarkable.  IMPRESSION: No active cardiopulmonary disease.   Electronically Signed   By: Amie Portland M.D.   On: 08/20/2016 15:05    dy Result   CLINICAL DATA:  Shortness of breath.  EXAM: CT ANGIOGRAPHY CHEST WITH CONTRAST  TECHNIQUE: Multidetector CT imaging of the  chest was performed using the standard protocol during bolus administration of intravenous contrast. Multiplanar CT image reconstructions and MIPs were obtained to evaluate the vascular anatomy.  CONTRAST:  75 mL of Isovue 370  COMPARISON:  Chest x-ray from earlier today and CT scan February 05, 2016  FINDINGS: Cardiovascular: The heart size normal. The thoracic aorta demonstrates no aneurysm or dissection. No pulmonary emboli identified.  Mediastinum/Nodes: There is a tiny hiatal hernia. The esophagus is otherwise normal. The thyroid is within normal limits. No adenopathy. No effusions.  Lungs/Pleura: The central airways are normal. No pneumothorax. Streaky opacities are seen in the dependent portions of both lungs medially, more prominent on the left than the right. Mild ground-glass in the medial right upper lobe as well. No other infiltrates. No pulmonary nodules or masses.  Upper Abdomen: No acute abnormality.  Musculoskeletal: No chest wall abnormality. No acute or significant osseous findings.  Review of the MIP images confirms the above findings.  IMPRESSION: 1. No pulmonary emboli. 2. Streaky opacities in the medial aspects of both lung bases and mild ground-glass in the anteromedial right upper lobe. While there is likely a component of atelectasis, I do suspect a superimposed infectious or inflammatory process. Recommend follow-up to resolution.   Electronically Signed   By: Gerome Sam III M.D   On: 08/20/2016 17:25    Abdomen. Emergency room [facility], she was came in with ____________________________________________   PROCEDURES  Procedure(s) performed:   Procedures  Critical Care performed:   ____________________________________________   INITIAL IMPRESSION / ASSESSMENT AND PLAN / ED COURSE  Pertinent labs & imaging results that were available during my care of the patient were reviewed by me and considered in my medical decision  making (see chart for details).   In ER patient sat goes up to 9899% on room air. Even with walking the patient maintains her sats in that level and range. We'll discharge her home with a little bit of prednisone for a couple days Zithromax and cough medicine and because it hurts so much when she coughs give her just a couple doses of Vicodin     ____________________________________________   FINAL CLINICAL IMPRESSION(S) / ED DIAGNOSES  Final diagnoses:  Community acquired pneumonia of left lung, unspecified part of lung      NEW MEDICATIONS STARTED DURING THIS VISIT:  New Prescriptions   No medications on file     Note:  This document was prepared using Dragon voice recognition software and may include unintentional dictation  errors.    Arnaldo Natal, MD 08/20/16 [phone]

## 2016-08-20 NOTE — ED Notes (Signed)
Patient transported to X-ray 

## 2016-08-25 ENCOUNTER — Ambulatory Visit: Payer: BLUE CROSS/BLUE SHIELD | Admitting: Psychology

## 2016-09-01 ENCOUNTER — Ambulatory Visit (INDEPENDENT_AMBULATORY_CARE_PROVIDER_SITE_OTHER): Payer: PRIVATE HEALTH INSURANCE | Admitting: Psychology

## 2016-09-01 DIAGNOSIS — F4310 Post-traumatic stress disorder, unspecified: Secondary | ICD-10-CM | POA: Diagnosis not present

## 2016-09-08 ENCOUNTER — Ambulatory Visit (INDEPENDENT_AMBULATORY_CARE_PROVIDER_SITE_OTHER): Payer: PRIVATE HEALTH INSURANCE | Admitting: Psychology

## 2016-09-08 DIAGNOSIS — F4310 Post-traumatic stress disorder, unspecified: Secondary | ICD-10-CM | POA: Diagnosis not present

## 2016-09-15 ENCOUNTER — Ambulatory Visit: Payer: BLUE CROSS/BLUE SHIELD | Admitting: Psychology

## 2016-09-22 ENCOUNTER — Ambulatory Visit (INDEPENDENT_AMBULATORY_CARE_PROVIDER_SITE_OTHER): Payer: PRIVATE HEALTH INSURANCE | Admitting: Psychology

## 2016-09-22 DIAGNOSIS — F4310 Post-traumatic stress disorder, unspecified: Secondary | ICD-10-CM | POA: Diagnosis not present

## 2016-09-29 ENCOUNTER — Ambulatory Visit: Payer: BLUE CROSS/BLUE SHIELD | Admitting: Psychology

## 2016-10-06 ENCOUNTER — Ambulatory Visit: Payer: BLUE CROSS/BLUE SHIELD | Admitting: Psychology

## 2016-10-13 ENCOUNTER — Ambulatory Visit (INDEPENDENT_AMBULATORY_CARE_PROVIDER_SITE_OTHER): Payer: PRIVATE HEALTH INSURANCE | Admitting: Psychology

## 2016-10-13 DIAGNOSIS — F4310 Post-traumatic stress disorder, unspecified: Secondary | ICD-10-CM

## 2016-10-20 ENCOUNTER — Ambulatory Visit (INDEPENDENT_AMBULATORY_CARE_PROVIDER_SITE_OTHER): Payer: PRIVATE HEALTH INSURANCE | Admitting: Psychology

## 2016-10-20 DIAGNOSIS — F4310 Post-traumatic stress disorder, unspecified: Secondary | ICD-10-CM | POA: Diagnosis not present

## 2016-10-27 DIAGNOSIS — I10 Essential (primary) hypertension: Secondary | ICD-10-CM | POA: Diagnosis not present

## 2016-10-27 DIAGNOSIS — F17210 Nicotine dependence, cigarettes, uncomplicated: Secondary | ICD-10-CM | POA: Diagnosis not present

## 2016-10-27 DIAGNOSIS — Z79899 Other long term (current) drug therapy: Secondary | ICD-10-CM | POA: Diagnosis not present

## 2016-10-27 DIAGNOSIS — G43909 Migraine, unspecified, not intractable, without status migrainosus: Secondary | ICD-10-CM | POA: Insufficient documentation

## 2016-10-27 DIAGNOSIS — Z5321 Procedure and treatment not carried out due to patient leaving prior to being seen by health care provider: Secondary | ICD-10-CM | POA: Diagnosis not present

## 2016-10-27 NOTE — ED Triage Notes (Signed)
Pt in with co migraine since yest hx of the same. States feels like one of her normal migraines, took fioricet and excedrine migraine without relief. States does feel nausea and light sensitivity.

## 2016-10-28 ENCOUNTER — Emergency Department
Admission: EM | Admit: 2016-10-28 | Discharge: 2016-10-28 | Disposition: A | Discharge status: Home / Self Care | Payer: BLUE CROSS/BLUE SHIELD | Attending: Emergency Medicine | Admitting: Emergency Medicine

## 2016-11-03 ENCOUNTER — Ambulatory Visit (INDEPENDENT_AMBULATORY_CARE_PROVIDER_SITE_OTHER): Payer: PRIVATE HEALTH INSURANCE | Admitting: Psychology

## 2016-11-03 DIAGNOSIS — F4310 Post-traumatic stress disorder, unspecified: Secondary | ICD-10-CM | POA: Diagnosis not present

## 2016-11-10 ENCOUNTER — Ambulatory Visit: Payer: Self-pay | Admitting: Psychology

## 2016-11-17 ENCOUNTER — Ambulatory Visit (INDEPENDENT_AMBULATORY_CARE_PROVIDER_SITE_OTHER): Payer: PRIVATE HEALTH INSURANCE | Admitting: Psychology

## 2016-11-17 DIAGNOSIS — F4310 Post-traumatic stress disorder, unspecified: Secondary | ICD-10-CM

## 2016-11-24 ENCOUNTER — Ambulatory Visit: Payer: PRIVATE HEALTH INSURANCE | Admitting: Psychology

## 2016-12-01 ENCOUNTER — Ambulatory Visit (INDEPENDENT_AMBULATORY_CARE_PROVIDER_SITE_OTHER): Payer: PRIVATE HEALTH INSURANCE | Admitting: Psychology

## 2016-12-01 DIAGNOSIS — F4310 Post-traumatic stress disorder, unspecified: Secondary | ICD-10-CM

## 2016-12-08 ENCOUNTER — Ambulatory Visit: Payer: PRIVATE HEALTH INSURANCE | Admitting: Psychology

## 2016-12-12 ENCOUNTER — Encounter: Payer: Self-pay | Admitting: Emergency Medicine

## 2016-12-12 ENCOUNTER — Emergency Department
Admission: EM | Admit: 2016-12-12 | Discharge: 2016-12-12 | Disposition: A | Discharge status: Home / Self Care | Payer: BLUE CROSS/BLUE SHIELD | Attending: Emergency Medicine | Admitting: Emergency Medicine

## 2016-12-12 DIAGNOSIS — K0889 Other specified disorders of teeth and supporting structures: Secondary | ICD-10-CM | POA: Insufficient documentation

## 2016-12-12 DIAGNOSIS — I10 Essential (primary) hypertension: Secondary | ICD-10-CM | POA: Insufficient documentation

## 2016-12-12 DIAGNOSIS — Z79899 Other long term (current) drug therapy: Secondary | ICD-10-CM | POA: Diagnosis not present

## 2016-12-12 DIAGNOSIS — F17210 Nicotine dependence, cigarettes, uncomplicated: Secondary | ICD-10-CM | POA: Diagnosis not present

## 2016-12-12 DIAGNOSIS — R030 Elevated blood-pressure reading, without diagnosis of hypertension: Secondary | ICD-10-CM

## 2016-12-12 MED ORDER — ACETAMINOPHEN-CODEINE #3 300-30 MG PO TABS: 1.0000 | ORAL_TABLET | Freq: Four times a day (QID) | ORAL | 0 refills | Status: DC | PRN

## 2016-12-12 NOTE — ED Triage Notes (Signed)
Patient presents to the ED with dental pain to the right side of her mouth.  Patient reports she was having dental pain approx. 1 week ago and went and saw a new dentist on Tuesday who removed 4 teeth.  Patient states, "He gave me antibiotics to take but he didn't explain how to take care of the places where he removed teeth or anything and 5 minutes after I left my mouth started hurting so bad."  Patient reports substance "oozing" from stitches site.  Patient's right side of her cheek appears swollen.

## 2016-12-12 NOTE — Discharge Instructions (Signed)
Continue taking clindamycin until completely finished. Make an appointment with Dr. Adriana Simas to have your blood pressure rechecked next week. Take medication only as directed. Call your dentist Monday for further evaluation. Also listed or dental clinics in this area.  OPTIONS FOR DENTAL FOLLOW UP CARE  [place] [facility] - Local Safety Net Dental Clinics [email or web address]   [facility] [phone])  Sharl Ma [phone])  Edith Endave [phone] ext [phone])  [facility] [phone])  [facility] [phone]) This clinic caters to the indigent population and is on a lottery system. Location: [employer] [facility], [employer], [address], [place] Clinic Hours: Wednesdays from 6pm - 9pm, patients seen by a lottery system. For dates, call or go to [email or web address] Services: Cleanings, fillings and simple extractions. Payment Options: DENTAL WORK IS FREE OF CHARGE. Bring proof of income or support. Best way to get seen: Arrive at 5:15 pm - this is a lottery, NOT first come/first serve, so arriving earlier will not increase your chances of being seen.     [facility] [phone] Select option 1 for emergencies   Location: [facility], [place], [address] [place] Clinic Hours: No walk-ins accepted - call the day before to schedule an appointment. Check in times are 9:30 am and 1:30 pm. Services: Simple extractions, temporary fillings, pulpectomy/pulp debridement, uncomplicated abscess drainage. Payment Options: PAYMENT IS DUE AT THE TIME OF SERVICE.  Fee is usually $100-200, additional surgical procedures (e.g. abscess drainage) may be extra. Cash, checks, Visa/MasterCard accepted.  Can file Medicaid if patient is covered for dental - patient should  call case worker to check. No discount for [facility] patients. Best way to get seen: MUST call the day before and get onto the schedule. Can usually be seen the next 1-2 days. No walk-ins accepted.     [facility] [phone]   Location: [facility], [address] [place] Clinic Hours: M, W, Th, F 8am or 1:30pm, Tues 9a or 1:30 - first come/first served. Services: Simple extractions, temporary fillings, uncomplicated abscess drainage.  You do not need to be an [facility] resident. Payment Options: PAYMENT IS DUE AT THE TIME OF SERVICE. Dental insurance, otherwise sliding scale - bring proof of income or support. Depending on income and treatment needed, cost is usually $50-200. Best way to get seen: Arrive early as it is first come/first served.     [facility] [phone]   Location: [address] Clinic Hours: Mon-Thu 8a-5p Services: Most basic dental services including extractions and fillings. Payment Options: PAYMENT IS DUE AT THE TIME OF SERVICE. Sliding scale, up to 50% off - bring proof if income or support. Medicaid with dental option accepted. Best way to get seen: Call to schedule an appointment, can usually be seen within 2 weeks OR they will try to see walk-ins - show up at 8a or 2p (you may have to wait).     [facility] [phone] [place] RESIDENTS ONLY   Location: [facility], [address] [place], [place] [address] Clinic Hours: By appointment only. Monday - Thursday 8am-5pm, Friday 8am-12pm Services: Cleanings, fillings, extractions. Payment Options: PAYMENT IS DUE AT THE TIME OF SERVICE. Cash, Visa or MasterCard. Sliding scale - $30 minimum per service. Best way to get seen: Come in to office, complete packet and make an appointment -  need proof of income or support monies for each household member and  proof of [facility] residence. Usually takes about a month to get in.     [facility] [phone]   Location: [address], [facility] Clinic Hours: Walk-in Urgent Care Dental Services are offered Monday-Friday mornings only. The numbers of emergencies accepted daily is limited to the number of providers available. Maximum 15 - Mondays, Wednesdays & Thursdays Maximum 10 - Tuesdays & Fridays Services: You do not need to be a [facility] resident to be seen for a dental emergency. Emergencies are defined as pain, swelling, abnormal bleeding, or dental trauma. Walkins will receive x-rays if needed. NOTE: Dental cleaning is not an emergency. Payment Options: PAYMENT IS DUE AT THE TIME OF SERVICE. Minimum co-pay is $40.00 for uninsured patients. Minimum co-pay is $3.00 for Medicaid with dental coverage. Dental Insurance is accepted and must be presented at time of visit. Medicare does not cover dental. Forms of payment: Cash, credit card, checks. Best way to get seen: If not previously registered with the clinic, walk-in dental registration begins at 7:15 am and is on a first come/first serve basis. If previously registered with the clinic, call to make an appointment.     [facility] [phone] [place] RESIDENTS ONLY   Location: [address] [place], [place] Clinic Hours: Mon-Thu 10a-2p Services: Extractions only! Payment Options: FREE (donations accepted) - bring proof of income or support Best way to get seen: Call and schedule an appointment OR come at 8am on the 1st Monday of every month (except for holidays) when it is first come/first served.     [facility] [phone]   Location: [address] [place], [place] Clinic Hours: Friday mornings Services, Payment Options, Best way to get seen: Call for info

## 2016-12-12 NOTE — ED Provider Notes (Signed)
[facility] Emergency Department Provider Note  ____________________________________________   First MD Initiated Contact with Patient 12/12/16 1057     (approximate)  I have reviewed the triage vital signs and the nursing notes.   HISTORY  Chief Complaint Dental Pain    HPI Chelsea Nolan is a 36 y.o. female is here complaining of dental pain.Patient states she was seen in the dentist office possibly one week ago for pain of her wisdom tooth. Patient states that she is unsure why she had 4 teeth extracted when she was therefore wisdom tooth. Patient states that she was placed on antibiotics that did not explain how to take care of the suture sites. Patient states that the antibiotic was the only thing given to her. She has been taking ibuprofen with some relief of her pain. She also states that last evening she took a Q-tip and "jammed it into the socket". She denies any fever or chills. There's been no nausea or vomiting. Patient has continued to smoke cigarettes after her dental extraction. Patient states that she took over-the-counter ibuprofen at approximately 8 AM today. Currently she rates her pain as 9 out of 10.  While in the emergency room patient was noted to have elevated blood pressure. Patient states that she has had hypertension in the past but lost a great deal rate and was told that she did not need to take her medication anymore. She has been off her antihypertensives for possibly 4 years. She states that she goes to her counselor on a regular basis and that her blood pressures have been normal.   Past Medical History:  Diagnosis Date  . Arthritis   . Hypertension   . Kidney stones   . Obesity   . Psoriatic arthritis [facility])    Dermatologist - Dr. Adolphus Birchwood, Rheumatologist - Dr. Gavin Potters  . Rheumatoid arthritis [facility])     Patient Active Problem List   Diagnosis Date Noted  . Hyperlipidemia 03/18/2016  . Chest pain 02/05/2016  . Migraine with  aura 01/16/2015  . Renal stones 05/17/2014  . PTSD (post-traumatic stress disorder) 04/02/2014  . Family history of ovarian cancer 11/15/2013  . Psoriatic arthritis (HCC) 01/26/2013  . Essential hypertension, benign 01/26/2013  . Obesity, unspecified 01/26/2013    Past Surgical History:  Procedure Laterality Date  . CESAREAN SECTION     pre-eclampsia  . LITHOTRIPSY    . VAGINAL DELIVERY      Prior to Admission medications   Medication Sig Start Date End Date Taking? Authorizing Provider  acetaminophen-codeine (TYLENOL #3) 300-30 MG tablet Take 1 tablet by mouth every 6 (six) hours as needed for moderate pain. 12/12/16   Tommi Rumps, PA-C  albuterol (PROVENTIL HFA;VENTOLIN HFA) 108 (90 Base) MCG/ACT inhaler Inhale 2 puffs into the lungs every 6 (six) hours as needed for wheezing or shortness of breath. 08/20/16   Arnaldo Natal, MD  Apremilast 30 MG TABS Take 30 mg by mouth 2 (two) times daily. 07/17/16   [provider]  butalbital-acetaminophen-caffeine (FIORICET, ESGIC) 50-325-40 MG tablet Take 1 tablet by mouth 2 (two) times daily as needed for headache. 06/30/16   Tommie Sams, DO  Cholecalciferol (VITAMIN D) 2000 UNITS CAPS Take 1 capsule by mouth daily.     [provider]  clonazePAM (KLONOPIN) 2 MG tablet Take 2 mg by mouth 3 (three) times daily as needed. 07/30/16   [provider]  loratadine (CLARITIN) 10 MG tablet Take 10 mg by mouth  daily as needed for allergies.     [provider]  Methotrexate, PF, 25 MG/0.5ML SOAJ Inject 25 mg into the skin every Thursday.  12/21/14   [provider]  prazosin (MINIPRESS) 2 MG capsule Take 2-4 mg by mouth at bedtime. Pt takes 1 to 2 capsules by mouth at bedtime.    [provider]  tamsulosin (FLOMAX) 0.4 MG CAPS capsule Take 0.4 mg by mouth as needed.    [provider]    Allergies Penicillins; Enbrel [etanercept]; Flexeril [cyclobenzaprine]; Prednisone; Sertraline;  Topamax [topiramate]; Amoxicillin; and Zofran [ondansetron hcl]  Family History  Problem Relation Age of Onset  . Diabetes Mother   . Hyperlipidemia Mother   . Hypertension Mother   . Arthritis Mother   . Lupus Mother   . Obesity Sister   . Cancer Maternal Grandmother        ovarian    Social History Social History  Substance Use Topics  . Smoking status: Current Every Day Smoker    Years: 15.00    Types: Cigarettes  . Smokeless tobacco: Never Used  . Alcohol use No    Review of Systems  Constitutional: No fever/chills Eyes: No visual changes. ENT: No sore throat. Positive for dental pain. Cardiovascular: Denies chest pain. Respiratory: Denies shortness of breath. Gastrointestinal: No abdominal pain.  No nausea, no vomiting.   Musculoskeletal: Negative for complaints. Skin: Negative for rash. Neurological: Negative for headaches, focal weakness or numbness. Psychiatric:Positive for PTSD   ____________________________________________   PHYSICAL EXAM:  VITAL SIGNS: ED Triage Vitals  Enc Vitals Group     BP 12/12/16 1045 (!) 176/127     Pulse Rate 12/12/16 1045 (!) 102     Resp 12/12/16 1045 18     Temp 12/12/16 1045 98.4 F (36.9 C)     Temp Source 12/12/16 1045 Oral     SpO2 12/12/16 1045 100 %     Weight 12/12/16 1045 180 lb (81.6 kg)     Height 12/12/16 1045 5\' 2"  (1.575 m)     Head Circumference --      Peak Flow --      Pain Score 12/12/16 1102 9     Pain Loc --      Pain Edu? --      Excl. in GC? --     Constitutional: Alert and oriented. Well appearing and in no acute distress. Eyes: Conjunctivae are normal. PERRL. EOMI. Head: Atraumatic. Nose: No congestion/rhinnorhea. Mouth/Throat: Mucous membranes are moist.  Oropharynx non-erythematous. There appears to be 3 molar extractions on the right lower posterior aspect of the mandible along with one extraction right upper anterior molar. No bleeding or drainage is noted. There is no excessive edema  present. Area appears to be healing well without any signs of infection. Neck: No stridor.   Hematological/Lymphatic/Immunilogical: No cervical lymphadenopathy. Cardiovascular: Normal rate, regular rhythm. Grossly normal heart sounds.  Good peripheral circulation. Respiratory: Normal respiratory effort.  No retractions. Lungs CTAB. Musculoskeletal: Moves upper and lower extremity is without difficulty. Normal gait was noted. Neurologic:  Normal speech and language. No gross focal neurologic deficits are appreciated. No gait instability. Skin:  Skin is warm, dry and intact. No rash noted. Psychiatric: Mood and affect are normal. Speech and behavior are normal.  ____________________________________________   LABS (all labs ordered are listed, but only abnormal results are displayed)  Labs Reviewed - No data to display  PROCEDURES  Procedure(s) performed: None  Procedures  Critical Care performed: No  ____________________________________________   INITIAL IMPRESSION / ASSESSMENT AND PLAN / ED COURSE  Pertinent labs & imaging results that were available during my care of the patient were reviewed by me and considered in my medical decision making (see chart for details).  Blood pressure was addressed with patient who states that she no longer has any problems with her blood pressure. She was encouraged to follow-up with Dr. Adriana Simas for a recheck of her blood pressure.  Patient will continue taking clindamycin as directed by her dentist. She was given a prescription for Tylenol No. 3 one every 6 hours as needed for pain, #10. She has called her dentist on Monday for further instruction or pain medication if needed. She is encouraged not to smoke or drink through straws.       ____________________________________________   FINAL CLINICAL IMPRESSION(S) / ED DIAGNOSES  Final diagnoses:  Pain, dental  Elevated blood pressure reading      NEW MEDICATIONS STARTED DURING THIS  VISIT:  Discharge Medication List as of 12/12/2016 11:50 AM    START taking these medications   Details  acetaminophen-codeine (TYLENOL #3) 300-30 MG tablet Take 1 tablet by mouth every 6 (six) hours as needed for moderate pain., Starting Sat 12/12/2016, Print         Note:  This document was prepared using Dragon voice recognition software and may include unintentional dictation errors.    Tommi Rumps, PA-C 12/12/16 1539    Governor Rooks, MD 12/13/16 [phone]

## 2016-12-15 ENCOUNTER — Ambulatory Visit (INDEPENDENT_AMBULATORY_CARE_PROVIDER_SITE_OTHER): Payer: PRIVATE HEALTH INSURANCE | Admitting: Psychology

## 2016-12-15 DIAGNOSIS — F4310 Post-traumatic stress disorder, unspecified: Secondary | ICD-10-CM

## 2016-12-22 ENCOUNTER — Ambulatory Visit: Payer: PRIVATE HEALTH INSURANCE | Admitting: Psychology

## 2016-12-29 ENCOUNTER — Ambulatory Visit (INDEPENDENT_AMBULATORY_CARE_PROVIDER_SITE_OTHER): Payer: PRIVATE HEALTH INSURANCE | Admitting: Psychology

## 2016-12-29 DIAGNOSIS — F4310 Post-traumatic stress disorder, unspecified: Secondary | ICD-10-CM

## 2017-01-05 ENCOUNTER — Ambulatory Visit: Payer: PRIVATE HEALTH INSURANCE | Admitting: Psychology

## 2017-01-19 ENCOUNTER — Ambulatory Visit (INDEPENDENT_AMBULATORY_CARE_PROVIDER_SITE_OTHER): Payer: PRIVATE HEALTH INSURANCE | Admitting: Psychology

## 2017-01-19 DIAGNOSIS — F4310 Post-traumatic stress disorder, unspecified: Secondary | ICD-10-CM | POA: Diagnosis not present

## 2017-02-02 ENCOUNTER — Ambulatory Visit (INDEPENDENT_AMBULATORY_CARE_PROVIDER_SITE_OTHER): Payer: PRIVATE HEALTH INSURANCE | Admitting: Psychology

## 2017-02-02 DIAGNOSIS — F4310 Post-traumatic stress disorder, unspecified: Secondary | ICD-10-CM | POA: Diagnosis not present

## 2017-02-09 ENCOUNTER — Ambulatory Visit: Payer: PRIVATE HEALTH INSURANCE | Admitting: Psychology

## 2017-03-02 ENCOUNTER — Ambulatory Visit (INDEPENDENT_AMBULATORY_CARE_PROVIDER_SITE_OTHER): Payer: PRIVATE HEALTH INSURANCE | Admitting: Psychology

## 2017-03-02 DIAGNOSIS — F4310 Post-traumatic stress disorder, unspecified: Secondary | ICD-10-CM

## 2017-03-06 ENCOUNTER — Emergency Department: Payer: BLUE CROSS/BLUE SHIELD

## 2017-03-06 ENCOUNTER — Emergency Department
Admission: EM | Admit: 2017-03-06 | Discharge: 2017-03-06 | Disposition: A | Discharge status: Home / Self Care | Payer: BLUE CROSS/BLUE SHIELD | Attending: Emergency Medicine | Admitting: Emergency Medicine

## 2017-03-06 DIAGNOSIS — Z87442 Personal history of urinary calculi: Secondary | ICD-10-CM | POA: Insufficient documentation

## 2017-03-06 DIAGNOSIS — I10 Essential (primary) hypertension: Secondary | ICD-10-CM | POA: Insufficient documentation

## 2017-03-06 DIAGNOSIS — N3001 Acute cystitis with hematuria: Secondary | ICD-10-CM | POA: Diagnosis not present

## 2017-03-06 DIAGNOSIS — Z79899 Other long term (current) drug therapy: Secondary | ICD-10-CM | POA: Insufficient documentation

## 2017-03-06 DIAGNOSIS — R109 Unspecified abdominal pain: Secondary | ICD-10-CM

## 2017-03-06 DIAGNOSIS — F17210 Nicotine dependence, cigarettes, uncomplicated: Secondary | ICD-10-CM | POA: Insufficient documentation

## 2017-03-06 DIAGNOSIS — R310 Gross hematuria: Secondary | ICD-10-CM

## 2017-03-06 LAB — URINALYSIS, COMPLETE (UACMP) WITH MICROSCOPIC
BILIRUBIN URINE: NEGATIVE
Glucose, UA: NEGATIVE mg/dL
KETONES UR: NEGATIVE mg/dL
LEUKOCYTES UA: NEGATIVE
Nitrite: NEGATIVE
Protein, ur: 30 mg/dL — AB
Specific Gravity, Urine: 1.023 (ref 1.005–1.030)
pH: 5 (ref 5.0–8.0)

## 2017-03-06 LAB — CBC
HEMATOCRIT: 45.6 % (ref 35.0–47.0)
HEMOGLOBIN: 15.5 g/dL (ref 12.0–16.0)
MCH: 33.5 pg (ref 26.0–34.0)
MCHC: 34 g/dL (ref 32.0–36.0)
MCV: 98.4 fL (ref 80.0–100.0)
Platelets: 338 10*3/uL (ref 150–440)
RBC: 4.64 MIL/uL (ref 3.80–5.20)
RDW: 13.8 % (ref 11.5–14.5)
WBC: 13.5 10*3/uL — ABNORMAL HIGH (ref 3.6–11.0)

## 2017-03-06 LAB — COMPREHENSIVE METABOLIC PANEL
ALBUMIN: 4 g/dL (ref 3.5–5.0)
ALK PHOS: 98 U/L (ref 38–126)
ALT: 25 U/L (ref 14–54)
ANION GAP: 9 (ref 5–15)
AST: 25 U/L (ref 15–41)
BILIRUBIN TOTAL: 0.6 mg/dL (ref 0.3–1.2)
BUN: 10 mg/dL (ref 6–20)
CO2: 27 mmol/L (ref 22–32)
Calcium: 9.1 mg/dL (ref 8.9–10.3)
Chloride: 105 mmol/L (ref 101–111)
Creatinine, Ser: 0.79 mg/dL (ref 0.44–1.00)
GFR calc Af Amer: >60 mL/min (ref >60)
GFR calc non Af Amer: >60 mL/min (ref >60)
GLUCOSE: 86 mg/dL (ref 65–99)
POTASSIUM: 3.3 mmol/L — AB (ref 3.5–5.1)
Sodium: 141 mmol/L (ref 135–145)
TOTAL PROTEIN: 7.8 g/dL (ref 6.5–8.1)

## 2017-03-06 MED ORDER — SODIUM CHLORIDE 0.9 % IV BOLUS (SEPSIS)
1000.0000 mL | Freq: Once | INTRAVENOUS | Status: AC
  Administered 2017-03-06: 1000 mL via INTRAVENOUS

## 2017-03-06 MED ORDER — METOCLOPRAMIDE HCL 10 MG PO TABS: 10.0000 mg | ORAL_TABLET | Freq: Three times a day (TID) | ORAL | 0 refills | Status: DC | PRN

## 2017-03-06 MED ORDER — KETOROLAC TROMETHAMINE 30 MG/ML IJ SOLN
30.0000 mg | Freq: Once | INTRAMUSCULAR | Status: AC
  Administered 2017-03-06: 30 mg via INTRAVENOUS
  Filled 2017-03-06: qty 1

## 2017-03-06 MED ORDER — TRAMADOL HCL 50 MG PO TABS: 50.0000 mg | ORAL_TABLET | Freq: Four times a day (QID) | ORAL | 0 refills | Status: DC | PRN

## 2017-03-06 MED ORDER — SULFAMETHOXAZOLE-TRIMETHOPRIM 800-160 MG PO TABS
1.0000 | ORAL_TABLET | Freq: Once | ORAL | Status: AC
  Administered 2017-03-06: 1 via ORAL
  Filled 2017-03-06: qty 1

## 2017-03-06 MED ORDER — SULFAMETHOXAZOLE-TRIMETHOPRIM 800-160 MG PO TABS: 1.0000 | ORAL_TABLET | Freq: Two times a day (BID) | ORAL | 0 refills | Status: DC

## 2017-03-06 NOTE — ED Triage Notes (Signed)
Patient reports mid lower abdominal pain that radiates into right flank.  Reports pain started Friday am and has gotten progressively worse.

## 2017-03-06 NOTE — ED Notes (Signed)
Urged patient to void. 

## 2017-03-06 NOTE — ED Provider Notes (Signed)
[facility] Emergency Department Provider Note   ____________________________________________   First MD Initiated Contact with Patient 03/06/17 0404     (approximate)  I have reviewed the triage vital signs and the nursing notes.   HISTORY  Chief Complaint Abdominal Pain    HPI Chelsea Nolan is a 36 y.o. female who comes into the hospital today thinking that she's passing a kidney stone. The patient states that she woke up this morning with some light pain. She hadn't eaten much and she did have some nausea. The patient states that she ate dinner and vomited. She thought she could sleep it off but the pain persisted. She woke up and went to the bathroom and saw straight blood in her urine. The patient also has some right-sided flank pain. The patient rates her pain a 7 out of 10 in intensity. She took some Tylenol and Aleve at home but it didn't help. The patient does have a history of kidney stones. The patient is here today for evaluation.She denies any fevers, chest pain, shortness of breath, dizziness or lightheadedness.   Past Medical History:  Diagnosis Date  . Arthritis   . Hypertension   . Kidney stones   . Obesity   . Psoriatic arthritis [facility])    Dermatologist - Dr. Adolphus Birchwood, Rheumatologist - Dr. Gavin Potters  . Rheumatoid arthritis [facility])     Patient Active Problem List   Diagnosis Date Noted  . Hyperlipidemia 03/18/2016  . Chest pain 02/05/2016  . Migraine with aura 01/16/2015  . Renal stones 05/17/2014  . PTSD (post-traumatic stress disorder) 04/02/2014  . Family history of ovarian cancer 11/15/2013  . Psoriatic arthritis (HCC) 01/26/2013  . Essential hypertension, benign 01/26/2013  . Obesity, unspecified 01/26/2013    Past Surgical History:  Procedure Laterality Date  . CESAREAN SECTION     pre-eclampsia  . LITHOTRIPSY    . VAGINAL DELIVERY      Prior to Admission medications   Medication Sig Start Date End Date Taking?  Authorizing Provider  acetaminophen-codeine (TYLENOL #3) 300-30 MG tablet Take 1 tablet by mouth every 6 (six) hours as needed for moderate pain. 12/12/16   Tommi Rumps, PA-C  albuterol (PROVENTIL HFA;VENTOLIN HFA) 108 (90 Base) MCG/ACT inhaler Inhale 2 puffs into the lungs every 6 (six) hours as needed for wheezing or shortness of breath. 08/20/16   Arnaldo Natal, MD  Apremilast 30 MG TABS Take 30 mg by mouth 2 (two) times daily. 07/17/16   [provider]  butalbital-acetaminophen-caffeine (FIORICET, ESGIC) 50-325-40 MG tablet Take 1 tablet by mouth 2 (two) times daily as needed for headache. 06/30/16   Tommie Sams, DO  Cholecalciferol (VITAMIN D) 2000 UNITS CAPS Take 1 capsule by mouth daily.     [provider]  clonazePAM (KLONOPIN) 2 MG tablet Take 2 mg by mouth 3 (three) times daily as needed. 07/30/16   [provider]  loratadine (CLARITIN) 10 MG tablet Take 10 mg by mouth daily as needed for allergies.     [provider]  Methotrexate, PF, 25 MG/0.5ML SOAJ Inject 25 mg into the skin every Thursday.  12/21/14   [provider]  metoCLOPramide (REGLAN) 10 MG tablet Take 1 tablet (10 mg total) by mouth every 8 (eight) hours as needed. 03/06/17   Rebecka Apley, MD  prazosin (MINIPRESS) 2 MG capsule Take 2-4 mg by mouth at bedtime. Pt takes 1 to 2 capsules by mouth at bedtime.    [provider]  sulfamethoxazole-trimethoprim (BACTRIM DS,SEPTRA DS) 800-160 MG tablet Take 1 tablet by mouth 2 (two) times daily. 03/06/17   Rebecka Apley, MD  tamsulosin (FLOMAX) 0.4 MG CAPS capsule Take 0.4 mg by mouth as needed.    [provider]  traMADol (ULTRAM) 50 MG tablet Take 1 tablet (50 mg total) by mouth every 6 (six) hours as needed. 03/06/17   Rebecka Apley, MD    Allergies Penicillins; Enbrel [etanercept]; Flexeril [cyclobenzaprine]; Prednisone; Sertraline; Topamax [topiramate]; Amoxicillin; and Zofran [ondansetron  hcl]  Family History  Problem Relation Age of Onset  . Diabetes Mother   . Hyperlipidemia Mother   . Hypertension Mother   . Arthritis Mother   . Lupus Mother   . Obesity Sister   . Cancer Maternal Grandmother        ovarian    Social History Social History  Substance Use Topics  . Smoking status: Current Every Day Smoker    Years: 15.00    Types: Cigarettes  . Smokeless tobacco: Never Used  . Alcohol use No    Review of Systems  Constitutional: No fever/chills Eyes: No visual changes. ENT: No sore throat. Cardiovascular: Denies chest pain. Respiratory: Denies shortness of breath. Gastrointestinal: Nausea and vomiting, No abdominal pain. No diarrhea.  No constipation. Genitourinary: Negative for dysuria. Musculoskeletal: Right flank pain and hematuria Skin: Negative for rash. Neurological: Negative for headaches, focal weakness or numbness.   ____________________________________________   PHYSICAL EXAM:  VITAL SIGNS: ED Triage Vitals [03/06/17 0332]  Enc Vitals Group     BP (!) 156/117     Pulse Rate (!) 104     Resp 20     Temp 97.9 F (36.6 C)     Temp Source Oral     SpO2 99 %     Weight 170 lb (77.1 kg)     Height 5\' 2"  (1.575 m)     Head Circumference      Peak Flow      Pain Score 7     Pain Loc      Pain Edu?      Excl. in GC?     Constitutional: Alert and oriented. Well appearing and in mild distress. Eyes: Conjunctivae are normal. PERRL. EOMI. Head: Atraumatic. Nose: No congestion/rhinnorhea. Mouth/Throat: Mucous membranes are moist.  Oropharynx non-erythematous. Cardiovascular: Normal rate, regular rhythm. Grossly normal heart sounds.  Good peripheral circulation. Respiratory: Normal respiratory effort.  No retractions. Lungs CTAB. Gastrointestinal: Soft and nontender. No distention. Positive bowel sounds, Right side and CVA tenderness to palpation Musculoskeletal: No lower extremity tenderness nor edema.   Neurologic:  Normal speech  and language. No gross focal neurologic deficits are appreciated.  Skin:  Skin is warm, dry and intact.  Psychiatric: Mood and affect are normal.   ____________________________________________   LABS (all labs ordered are listed, but only abnormal results are displayed)  Labs Reviewed  CBC - Abnormal; Notable for the following:       Result Value   WBC 13.5 (*)    All other components within normal limits  COMPREHENSIVE METABOLIC PANEL - Abnormal; Notable for the following:    Potassium 3.3 (*)    All other components within normal limits  URINALYSIS, COMPLETE (UACMP) WITH MICROSCOPIC - Abnormal; Notable for the following:    Color, Urine AMBER (*)    APPearance CLOUDY (*)    Hgb urine dipstick LARGE (*)    Protein, ur 30 (*)    Bacteria, UA MANY (*)  Squamous Epithelial / LPF 6-30 (*)    All other components within normal limits  URINE CULTURE   ____________________________________________  EKG  none ____________________________________________  RADIOLOGY  Ct Renal Stone Study  Result Date: 03/06/2017 CLINICAL DATA:  Initial evaluation for acute right flank pain. EXAM: CT ABDOMEN AND PELVIS WITHOUT CONTRAST TECHNIQUE: Multidetector CT imaging of the abdomen and pelvis was performed following the standard protocol without IV contrast. COMPARISON:  Prior CT from 12/31/2014. FINDINGS: Lower chest: Mild hazy subsegmental atelectatic changes noted at the right lung base. 4 mm nodular density along the left hemidiaphragm noted, stable from prior, and most likely benign given stability. Visualized lung bases are otherwise clear. Hepatobiliary: Liver demonstrates a somewhat mottled appearance, favored to reflect heterogeneous fat deposition, although this is poorly evaluated on this noncontrast examination. Gallbladder within normal limits. No biliary dilatation. Pancreas: Pancreas within normal limits. Spleen: Spleen within normal limits. Adrenals/Urinary Tract: Adrenal glands are  normal. 5 mm nonobstructive stone present within the lower pole of the left kidney. Additional punctate nonobstructive stone present within the lower pole the right kidney (series 6, image 40). No other definite radiopaque calculi seen within either kidney. No radiopaque stones seen along the course of either renal collecting system. No hydroureter. Partially distended bladder within normal limits. No layering stones within the bladder lumen. Stomach/Bowel: Stomach within normal limits. Duodenal diverticulum without associated inflammation noted. No evidence for bowel obstruction. Appendix normal. No acute inflammatory changes seen about the bowels. Vascular/Lymphatic: Intra-abdominal aorta of normal caliber. No adenopathy. Reproductive: Uterus is absent.  Ovaries within normal limits. Other: No free air or fluid. Musculoskeletal: No acute osseus abnormality. No worrisome lytic or blastic osseous lesions. IMPRESSION: 1. Bilateral nonobstructive nephrolithiasis as above. No CT evidence for obstructive uropathy. No ureterolithiasis. 2. Mottled appearance of the liver, favored to be related to heterogeneous fat deposition, although this is poorly evaluated on this noncontrast examination. Correlation with LFTs suggested. Additionally, follow-up exam with nonemergent MRI would likely be helpful for complete evaluation. 3. No other acute intra-abdominal or pelvic process. Electronically Signed   By: Rise Mu M.D.   On: 03/06/2017 05:14    ____________________________________________   PROCEDURES  Procedure(s) performed: None  Procedures  Critical Care performed: No  ____________________________________________   INITIAL IMPRESSION / ASSESSMENT AND PLAN / ED COURSE  Pertinent labs & imaging results that were available during my care of the patient were reviewed by me and considered in my medical decision making (see chart for details).  This is a 36 year old female who comes into the  hospital today with some right flank pain and some hematuria. The patient thinks that she may be passing a kidney stone. I sent the patient for a CT scan and there was no obstructive uropathy. The patient's urinalysis though shows many bacteria and too numerous to count white blood cells and red blood cells. I feel that the patient has urinary tract infection. I gave the patient a dose of Bactrim and I will discharge her with some Bactrim. The patient also had some Toradol liter of normal saline. I will reassess the patient and discharge her with a UTI. I will send a urine culture as well.      ____________________________________________   FINAL CLINICAL IMPRESSION(S) / ED DIAGNOSES  Final diagnoses:  Gross hematuria  Flank pain  Acute cystitis with hematuria      NEW MEDICATIONS STARTED DURING THIS VISIT:  New Prescriptions   METOCLOPRAMIDE (REGLAN) 10 MG TABLET    Take 1 tablet (  10 mg total) by mouth every 8 (eight) hours as needed.   SULFAMETHOXAZOLE-TRIMETHOPRIM (BACTRIM DS,SEPTRA DS) 800-160 MG TABLET    Take 1 tablet by mouth 2 (two) times daily.   TRAMADOL (ULTRAM) 50 MG TABLET    Take 1 tablet (50 mg total) by mouth every 6 (six) hours as needed.     Note:  This document was prepared using Dragon voice recognition software and may include unintentional dictation errors.    Rebecka Apley, MD 03/06/17 [phone]

## 2017-03-06 NOTE — ED Notes (Signed)
Patient transported to CT 

## 2017-03-06 NOTE — Discharge Instructions (Signed)
Please follow up with your primary care physician.

## 2017-03-07 LAB — URINE CULTURE

## 2017-03-09 ENCOUNTER — Ambulatory Visit (INDEPENDENT_AMBULATORY_CARE_PROVIDER_SITE_OTHER): Payer: PRIVATE HEALTH INSURANCE | Admitting: Psychology

## 2017-03-09 DIAGNOSIS — F4310 Post-traumatic stress disorder, unspecified: Secondary | ICD-10-CM

## 2017-03-09 DIAGNOSIS — Z630 Problems in relationship with spouse or partner: Secondary | ICD-10-CM | POA: Diagnosis not present

## 2017-03-16 ENCOUNTER — Ambulatory Visit: Payer: Self-pay | Admitting: Psychology

## 2017-03-23 ENCOUNTER — Ambulatory Visit: Payer: PRIVATE HEALTH INSURANCE | Admitting: Psychology

## 2017-03-25 ENCOUNTER — Ambulatory Visit: Payer: Self-pay | Admitting: Psychology

## 2017-03-30 ENCOUNTER — Ambulatory Visit (INDEPENDENT_AMBULATORY_CARE_PROVIDER_SITE_OTHER): Payer: PRIVATE HEALTH INSURANCE | Admitting: Psychology

## 2017-03-30 DIAGNOSIS — F4310 Post-traumatic stress disorder, unspecified: Secondary | ICD-10-CM | POA: Diagnosis not present

## 2017-03-30 DIAGNOSIS — Z630 Problems in relationship with spouse or partner: Secondary | ICD-10-CM

## 2017-04-06 ENCOUNTER — Ambulatory Visit (INDEPENDENT_AMBULATORY_CARE_PROVIDER_SITE_OTHER): Payer: PRIVATE HEALTH INSURANCE | Admitting: Psychology

## 2017-04-06 DIAGNOSIS — Z630 Problems in relationship with spouse or partner: Secondary | ICD-10-CM

## 2017-04-06 DIAGNOSIS — F4310 Post-traumatic stress disorder, unspecified: Secondary | ICD-10-CM | POA: Diagnosis not present

## 2017-04-13 ENCOUNTER — Ambulatory Visit: Payer: Self-pay | Admitting: Psychology

## 2017-04-20 ENCOUNTER — Ambulatory Visit (INDEPENDENT_AMBULATORY_CARE_PROVIDER_SITE_OTHER): Payer: PRIVATE HEALTH INSURANCE | Admitting: Psychology

## 2017-04-20 DIAGNOSIS — Z630 Problems in relationship with spouse or partner: Secondary | ICD-10-CM

## 2017-04-20 DIAGNOSIS — F4310 Post-traumatic stress disorder, unspecified: Secondary | ICD-10-CM | POA: Diagnosis not present

## 2017-04-27 ENCOUNTER — Ambulatory Visit: Payer: Self-pay | Admitting: Psychology

## 2017-05-04 ENCOUNTER — Ambulatory Visit: Payer: Self-pay | Admitting: Psychology

## 2017-05-11 ENCOUNTER — Ambulatory Visit (INDEPENDENT_AMBULATORY_CARE_PROVIDER_SITE_OTHER): Payer: PRIVATE HEALTH INSURANCE | Admitting: Psychology

## 2017-05-11 DIAGNOSIS — F4310 Post-traumatic stress disorder, unspecified: Secondary | ICD-10-CM | POA: Diagnosis not present

## 2017-05-11 DIAGNOSIS — Z630 Problems in relationship with spouse or partner: Secondary | ICD-10-CM

## 2017-05-18 ENCOUNTER — Ambulatory Visit: Payer: Self-pay | Admitting: Psychology

## 2017-05-25 ENCOUNTER — Ambulatory Visit: Payer: Self-pay | Admitting: Psychology

## 2017-06-01 ENCOUNTER — Ambulatory Visit: Payer: Self-pay | Admitting: Psychology

## 2017-06-08 ENCOUNTER — Ambulatory Visit (INDEPENDENT_AMBULATORY_CARE_PROVIDER_SITE_OTHER): Payer: PRIVATE HEALTH INSURANCE | Admitting: Psychology

## 2017-06-08 DIAGNOSIS — F4310 Post-traumatic stress disorder, unspecified: Secondary | ICD-10-CM

## 2017-06-08 DIAGNOSIS — Z630 Problems in relationship with spouse or partner: Secondary | ICD-10-CM | POA: Diagnosis not present

## 2017-06-14 ENCOUNTER — Other Ambulatory Visit: Payer: Self-pay | Admitting: Acute Care

## 2017-06-14 DIAGNOSIS — R51 Headache: Principal | ICD-10-CM

## 2017-06-14 DIAGNOSIS — R519 Headache, unspecified: Secondary | ICD-10-CM

## 2017-06-15 ENCOUNTER — Ambulatory Visit: Payer: Self-pay | Admitting: Psychology

## 2017-06-22 ENCOUNTER — Ambulatory Visit: Payer: Self-pay | Admitting: Psychology

## 2017-06-29 ENCOUNTER — Ambulatory Visit (INDEPENDENT_AMBULATORY_CARE_PROVIDER_SITE_OTHER): Payer: PRIVATE HEALTH INSURANCE | Admitting: Psychology

## 2017-06-29 DIAGNOSIS — Z630 Problems in relationship with spouse or partner: Secondary | ICD-10-CM | POA: Diagnosis not present

## 2017-06-29 DIAGNOSIS — F4310 Post-traumatic stress disorder, unspecified: Secondary | ICD-10-CM | POA: Diagnosis not present

## 2017-06-30 ENCOUNTER — Ambulatory Visit
Admission: RE | Admit: 2017-06-30 | Discharge: 2017-06-30 | Disposition: A | Discharge status: Home / Self Care | Payer: BLUE CROSS/BLUE SHIELD | Source: Ambulatory Visit | Attending: Acute Care | Admitting: Acute Care

## 2017-06-30 DIAGNOSIS — R51 Headache: Secondary | ICD-10-CM | POA: Diagnosis present

## 2017-06-30 DIAGNOSIS — R519 Headache, unspecified: Secondary | ICD-10-CM

## 2017-07-06 ENCOUNTER — Ambulatory Visit (INDEPENDENT_AMBULATORY_CARE_PROVIDER_SITE_OTHER): Payer: PRIVATE HEALTH INSURANCE | Admitting: Psychology

## 2017-07-06 DIAGNOSIS — Z630 Problems in relationship with spouse or partner: Secondary | ICD-10-CM

## 2017-07-06 DIAGNOSIS — F4310 Post-traumatic stress disorder, unspecified: Secondary | ICD-10-CM | POA: Diagnosis not present

## 2017-07-13 ENCOUNTER — Ambulatory Visit: Payer: PRIVATE HEALTH INSURANCE | Admitting: Psychology

## 2017-08-10 ENCOUNTER — Ambulatory Visit (INDEPENDENT_AMBULATORY_CARE_PROVIDER_SITE_OTHER): Payer: PRIVATE HEALTH INSURANCE | Admitting: Psychology

## 2017-08-10 DIAGNOSIS — F4310 Post-traumatic stress disorder, unspecified: Secondary | ICD-10-CM | POA: Diagnosis not present

## 2017-08-10 DIAGNOSIS — Z630 Problems in relationship with spouse or partner: Secondary | ICD-10-CM | POA: Diagnosis not present

## 2017-08-17 ENCOUNTER — Ambulatory Visit: Payer: PRIVATE HEALTH INSURANCE | Admitting: Psychology

## 2017-08-24 ENCOUNTER — Ambulatory Visit (INDEPENDENT_AMBULATORY_CARE_PROVIDER_SITE_OTHER): Payer: PRIVATE HEALTH INSURANCE | Admitting: Psychology

## 2017-08-24 DIAGNOSIS — F4310 Post-traumatic stress disorder, unspecified: Secondary | ICD-10-CM

## 2017-08-24 DIAGNOSIS — Z630 Problems in relationship with spouse or partner: Secondary | ICD-10-CM

## 2017-08-31 ENCOUNTER — Ambulatory Visit: Payer: PRIVATE HEALTH INSURANCE | Admitting: Psychology

## 2017-09-06 ENCOUNTER — Ambulatory Visit: Payer: PRIVATE HEALTH INSURANCE | Admitting: Psychology

## 2017-09-07 ENCOUNTER — Ambulatory Visit (INDEPENDENT_AMBULATORY_CARE_PROVIDER_SITE_OTHER): Payer: PRIVATE HEALTH INSURANCE | Admitting: Psychology

## 2017-09-07 DIAGNOSIS — F4310 Post-traumatic stress disorder, unspecified: Secondary | ICD-10-CM

## 2017-09-07 DIAGNOSIS — Z630 Problems in relationship with spouse or partner: Secondary | ICD-10-CM

## 2017-09-20 ENCOUNTER — Ambulatory Visit: Payer: PRIVATE HEALTH INSURANCE | Admitting: Psychology

## 2017-10-04 ENCOUNTER — Ambulatory Visit (INDEPENDENT_AMBULATORY_CARE_PROVIDER_SITE_OTHER): Payer: PRIVATE HEALTH INSURANCE | Admitting: Psychology

## 2017-10-04 DIAGNOSIS — Z630 Problems in relationship with spouse or partner: Secondary | ICD-10-CM

## 2017-10-04 DIAGNOSIS — F4310 Post-traumatic stress disorder, unspecified: Secondary | ICD-10-CM | POA: Diagnosis not present

## 2017-10-18 ENCOUNTER — Ambulatory Visit: Payer: Self-pay | Admitting: Psychology

## 2017-11-01 ENCOUNTER — Ambulatory Visit (INDEPENDENT_AMBULATORY_CARE_PROVIDER_SITE_OTHER): Payer: Medicare Other | Admitting: Psychology

## 2017-11-01 DIAGNOSIS — Z630 Problems in relationship with spouse or partner: Secondary | ICD-10-CM | POA: Diagnosis not present

## 2017-11-01 DIAGNOSIS — F4310 Post-traumatic stress disorder, unspecified: Secondary | ICD-10-CM

## 2017-11-15 ENCOUNTER — Ambulatory Visit: Payer: Self-pay | Admitting: Psychology

## 2017-11-29 ENCOUNTER — Ambulatory Visit (INDEPENDENT_AMBULATORY_CARE_PROVIDER_SITE_OTHER): Payer: Medicare Other | Admitting: Psychology

## 2017-11-29 DIAGNOSIS — F4310 Post-traumatic stress disorder, unspecified: Secondary | ICD-10-CM

## 2017-11-29 DIAGNOSIS — Z630 Problems in relationship with spouse or partner: Secondary | ICD-10-CM

## 2017-12-13 ENCOUNTER — Ambulatory Visit (INDEPENDENT_AMBULATORY_CARE_PROVIDER_SITE_OTHER): Payer: Medicare Other | Admitting: Psychology

## 2017-12-13 DIAGNOSIS — F4310 Post-traumatic stress disorder, unspecified: Secondary | ICD-10-CM

## 2017-12-13 DIAGNOSIS — Z630 Problems in relationship with spouse or partner: Secondary | ICD-10-CM | POA: Diagnosis not present

## 2018-01-10 ENCOUNTER — Ambulatory Visit: Payer: Medicaid Other | Admitting: Psychology

## 2018-01-24 ENCOUNTER — Ambulatory Visit (INDEPENDENT_AMBULATORY_CARE_PROVIDER_SITE_OTHER): Payer: Medicare Other | Admitting: Psychology

## 2018-01-24 DIAGNOSIS — F4310 Post-traumatic stress disorder, unspecified: Secondary | ICD-10-CM | POA: Diagnosis not present

## 2018-02-07 ENCOUNTER — Ambulatory Visit: Payer: Medicaid Other | Admitting: Psychology

## 2018-02-21 ENCOUNTER — Ambulatory Visit: Payer: Medicaid Other | Admitting: Psychology

## 2018-03-07 ENCOUNTER — Ambulatory Visit: Payer: Medicare Other | Admitting: Psychology

## 2018-04-18 ENCOUNTER — Ambulatory Visit: Payer: Medicaid Other | Admitting: Psychology

## 2018-05-02 ENCOUNTER — Ambulatory Visit: Payer: Medicaid Other | Admitting: Psychology

## 2018-05-16 ENCOUNTER — Ambulatory Visit: Payer: Medicaid Other | Admitting: Psychology

## 2018-05-30 ENCOUNTER — Ambulatory Visit: Payer: Medicaid Other | Admitting: Psychology

## 2018-08-12 ENCOUNTER — Emergency Department
Admission: EM | Admit: 2018-08-12 | Discharge: 2018-08-12 | Disposition: A | Discharge status: Home / Self Care | Payer: Medicare Other | Attending: Emergency Medicine | Admitting: Emergency Medicine

## 2018-08-12 ENCOUNTER — Encounter: Payer: Self-pay | Admitting: Emergency Medicine

## 2018-08-12 DIAGNOSIS — I10 Essential (primary) hypertension: Secondary | ICD-10-CM | POA: Insufficient documentation

## 2018-08-12 DIAGNOSIS — H5789 Other specified disorders of eye and adnexa: Secondary | ICD-10-CM | POA: Diagnosis present

## 2018-08-12 DIAGNOSIS — Z79899 Other long term (current) drug therapy: Secondary | ICD-10-CM | POA: Diagnosis not present

## 2018-08-12 DIAGNOSIS — F17210 Nicotine dependence, cigarettes, uncomplicated: Secondary | ICD-10-CM | POA: Insufficient documentation

## 2018-08-12 DIAGNOSIS — H1032 Unspecified acute conjunctivitis, left eye: Secondary | ICD-10-CM

## 2018-08-12 MED ORDER — TETRACAINE HCL 0.5 % OP SOLN
2.0000 [drp] | Freq: Once | OPHTHALMIC | Status: DC
  Filled 2018-08-12: qty 4

## 2018-08-12 MED ORDER — MOXIFLOXACIN HCL 0.5 % OP SOLN: 1.0000 [drp] | Freq: Three times a day (TID) | OPHTHALMIC | 0 refills | Status: AC

## 2018-08-12 MED ORDER — KETOROLAC TROMETHAMINE 0.4 % OP SOLN: 1.0000 [drp] | Freq: Four times a day (QID) | OPHTHALMIC | 0 refills | Status: DC

## 2018-08-12 MED ORDER — FLUORESCEIN SODIUM 1 MG OP STRP
1.0000 | ORAL_STRIP | Freq: Once | OPHTHALMIC | Status: DC
  Filled 2018-08-12: qty 1

## 2018-08-12 MED ORDER — CETIRIZINE HCL 10 MG PO CAPS: 10.0000 mg | ORAL_CAPSULE | Freq: Every day | ORAL | 0 refills | Status: DC

## 2018-08-12 NOTE — ED Notes (Signed)
See triage note  Presents with left eye irritated and crusty  sxs' started couple of days ago

## 2018-08-12 NOTE — ED Provider Notes (Signed)
[facility] Emergency Department Provider Note  ____________________________________________  Time seen: Approximately 11:10 AM  I have reviewed the triage vital signs and the nursing notes.   HISTORY  Chief Complaint Eye Drainage and Conjunctivitis    HPI Chelsea Nolan is a 38 y.o. female that presents to the emergency department for evaluation of left eye irritation and crusting this morning. Patient states that she was doing her acrylic nails on Wednesday and is not sure if she scratched her eye or got some of the medicine in her eye.  She immediately took out her contacts after incident and flushed her eye.  She has been wearing her glasses since.  This morning she woke up and was unable to open her eye due to it being crusted shut.  Eye itches and burns.  She has noticed surrounding her eye is puffy.  Her daughter got a cat for Christmas and states that she has not been around a cat in many years and may be allergic.  Patient used a couple drops of some ciprofloxacin drops that her husband had leftover last night.  No photophobia, nausea, vomiting.  Past Medical History:  Diagnosis Date  . Arthritis   . Hypertension   . Kidney stones   . Obesity   . Psoriatic arthritis [facility](HCC)    Dermatologist - Dr. Adolphus Birchwoodasher, Rheumatologist - Dr. Gavin PottersKernodle  . Rheumatoid arthritis [facility](HCC)     Patient Active Problem List   Diagnosis Date Noted  . Hyperlipidemia 03/18/2016  . Chest pain 02/05/2016  . Migraine with aura 01/16/2015  . Renal stones 05/17/2014  . PTSD (post-traumatic stress disorder) 04/02/2014  . Family history of ovarian cancer 11/15/2013  . Psoriatic arthritis (HCC) 01/26/2013  . Essential hypertension, benign 01/26/2013  . Obesity, unspecified 01/26/2013    Past Surgical History:  Procedure Laterality Date  . CESAREAN SECTION     pre-eclampsia  . LITHOTRIPSY    . VAGINAL DELIVERY      Prior to Admission medications   Medication Sig Start Date End  Date Taking? Authorizing Provider  acetaminophen-codeine (TYLENOL #3) 300-30 MG tablet Take 1 tablet by mouth every 6 (six) hours as needed for moderate pain. 12/12/16   Tommi RumpsSummers, Rhonda L, PA-C  albuterol (PROVENTIL HFA;VENTOLIN HFA) 108 (90 Base) MCG/ACT inhaler Inhale 2 puffs into the lungs every 6 (six) hours as needed for wheezing or shortness of breath. 08/20/16   Arnaldo NatalMalinda, Paul F, MD  Apremilast 30 MG TABS Take 30 mg by mouth 2 (two) times daily. 07/17/16   [provider]  butalbital-acetaminophen-caffeine (FIORICET, ESGIC) 50-325-40 MG tablet Take 1 tablet by mouth 2 (two) times daily as needed for headache. 06/30/16   Tommie Samsook, Jayce G, DO  Cetirizine HCl (ZYRTEC ALLERGY) 10 MG CAPS Take 1 capsule (10 mg total) by mouth daily. 08/12/18   Enid DerryWagner, Audrina Marten, PA-C  Cholecalciferol (VITAMIN D) 2000 UNITS CAPS Take 1 capsule by mouth daily.     [provider]  clonazePAM (KLONOPIN) 2 MG tablet Take 2 mg by mouth 3 (three) times daily as needed. 07/30/16   [provider]  ketorolac (ACULAR) 0.4 % SOLN Place 1 drop into the left eye 4 (four) times daily. 08/12/18   Enid DerryWagner, Zacherie Honeyman, PA-C  loratadine (CLARITIN) 10 MG tablet Take 10 mg by mouth daily as needed for allergies.     [provider]  Methotrexate, PF, 25 MG/0.5ML SOAJ Inject 25 mg into the skin every Thursday.  12/21/14   [provider]  metoCLOPramide (  REGLAN) 10 MG tablet Take 1 tablet (10 mg total) by mouth every 8 (eight) hours as needed. 03/06/17   Rebecka Apley, MD  moxifloxacin (VIGAMOX) 0.5 % ophthalmic solution Place 1 drop into both eyes 3 (three) times daily for 7 days. 08/12/18 08/19/18  Enid Derry, PA-C  prazosin (MINIPRESS) 2 MG capsule Take 2-4 mg by mouth at bedtime. Pt takes 1 to 2 capsules by mouth at bedtime.    [provider]  sulfamethoxazole-trimethoprim (BACTRIM DS,SEPTRA DS) 800-160 MG tablet Take 1 tablet by mouth 2 (two) times daily. 03/06/17   Rebecka Apley, MD   tamsulosin (FLOMAX) 0.4 MG CAPS capsule Take 0.4 mg by mouth as needed.    [provider]  traMADol (ULTRAM) 50 MG tablet Take 1 tablet (50 mg total) by mouth every 6 (six) hours as needed. 03/06/17   Rebecka Apley, MD    Allergies Penicillins; Enbrel [etanercept]; Flexeril [cyclobenzaprine]; Prednisone; Sertraline; Topamax [topiramate]; Amoxicillin; and Zofran [ondansetron hcl]  Family History  Problem Relation Age of Onset  . Diabetes Mother   . Hyperlipidemia Mother   . Hypertension Mother   . Arthritis Mother   . Lupus Mother   . Obesity Sister   . Cancer Maternal Grandmother        ovarian    Social History Social History   Tobacco Use  . Smoking status: Current Every Day Smoker    Years: 15.00    Types: Cigarettes  . Smokeless tobacco: Never Used  Substance Use Topics  . Alcohol use: No  . Drug use: No     Review of Systems  Cardiovascular: No chest pain. Respiratory: No cough. No SOB. Gastrointestinal: No abdominal pain.  No nausea, no vomiting.  Musculoskeletal: Negative for musculoskeletal pain. Skin: Negative for rash, abrasions, lacerations, ecchymosis. Neurological: Negative for headaches, numbness or tingling   ____________________________________________   PHYSICAL EXAM:  VITAL SIGNS: ED Triage Vitals  Enc Vitals Group     BP 08/12/18 0818 138/89     Pulse Rate 08/12/18 0818 97     Resp 08/12/18 0818 18     Temp 08/12/18 0818 98.5 F (36.9 C)     Temp Source 08/12/18 0818 Oral     SpO2 08/12/18 0818 98 %     Weight 08/12/18 0812 180 lb (81.6 kg)     Height 08/12/18 0812 5\' 2"  (1.575 m)     Head Circumference --      Peak Flow --      Pain Score 08/12/18 0812 7     Pain Loc --      Pain Edu? --      Excl. in GC? --      Constitutional: Alert and oriented. Well appearing and in no acute distress. Eyes: Conjunctivae are normal. PERRL. EOMI. Possible very faint 1 mm defect to 6 o'clock position of iris. Head:  Atraumatic. ENT:      Ears:      Nose: No congestion/rhinnorhea.      Mouth/Throat: Mucous membranes are moist.  Neck: No stridor. Cardiovascular: Normal rate, regular rhythm.  Good peripheral circulation. Respiratory: Normal respiratory effort without tachypnea or retractions. Lungs CTAB. Good air entry to the bases with no decreased or absent breath sounds. Musculoskeletal: Full range of motion to all extremities. No gross deformities appreciated. Neurologic:  Normal speech and language. No gross focal neurologic deficits are appreciated.  Skin:  Skin is warm, dry and intact. No rash noted. Psychiatric: Mood and affect are normal.  Speech and behavior are normal. Patient exhibits appropriate insight and judgement.   ____________________________________________   LABS (all labs ordered are listed, but only abnormal results are displayed)  Labs Reviewed - No data to display ____________________________________________  EKG   ____________________________________________  RADIOLOGY   No results found.  ____________________________________________    PROCEDURES  Procedure(s) performed:    Procedures    Medications  fluorescein ophthalmic strip 1 strip (has no administration in time range)  tetracaine (PONTOCAINE) 0.5 % ophthalmic solution 2 drop (has no administration in time range)     ____________________________________________   INITIAL IMPRESSION / ASSESSMENT AND PLAN / ED COURSE  Pertinent labs & imaging results that were available during my care of the patient were reviewed by me and considered in my medical decision making (see chart for details).  Review of the [place] CSRS was performed in accordance of the [facility] prior to dispensing any controlled drugs.   Patient's diagnosis is consistent with conjunctivitis.  Vital signs and exam are reassuring.  Patient will be given a prescription for Zyrtec for possible cat allergy.  Patient will be discharged home  with prescriptions for vigamox and zytrec. Patient is to follow up with ophthalmology as directed. Patient is given ED precautions to return to the ED for any worsening or new symptoms.     ____________________________________________  FINAL CLINICAL IMPRESSION(S) / ED DIAGNOSES  Final diagnoses:  Acute bacterial conjunctivitis of left eye      NEW MEDICATIONS STARTED DURING THIS VISIT:  ED Discharge Orders         Ordered    moxifloxacin (VIGAMOX) 0.5 % ophthalmic solution  3 times daily     08/12/18 1055    ketorolac (ACULAR) 0.4 % SOLN  4 times daily     08/12/18 1055    Cetirizine HCl (ZYRTEC ALLERGY) 10 MG CAPS  Daily     08/12/18 1055              This chart was dictated using voice recognition software/Dragon. Despite best efforts to proofread, errors can occur which can change the meaning. Any change was purely unintentional.    Enid Derry, PA-C 08/12/18 1234    Don Perking, [place], MD 08/14/18 [phone]

## 2018-08-12 NOTE — Discharge Instructions (Addendum)
Please make an appointment with Dr. Sharman Crate on Monday for eye recheck.

## 2018-08-12 NOTE — ED Triage Notes (Signed)
Pt reports this am her left eye was crusted shut. Pt reports eye itches and burns. States wears contacts but has been wearing glasses the last few days.

## 2018-09-12 DIAGNOSIS — F330 Major depressive disorder, recurrent, mild: Secondary | ICD-10-CM | POA: Insufficient documentation

## 2018-11-05 ENCOUNTER — Other Ambulatory Visit: Payer: Self-pay

## 2018-11-05 ENCOUNTER — Emergency Department
Admission: EM | Admit: 2018-11-05 | Discharge: 2018-11-05 | Disposition: A | Discharge status: Home / Self Care | Payer: Medicare Other | Attending: Emergency Medicine | Admitting: Emergency Medicine

## 2018-11-05 ENCOUNTER — Emergency Department: Payer: Medicare Other

## 2018-11-05 DIAGNOSIS — R509 Fever, unspecified: Secondary | ICD-10-CM | POA: Insufficient documentation

## 2018-11-05 DIAGNOSIS — R1110 Vomiting, unspecified: Secondary | ICD-10-CM | POA: Diagnosis not present

## 2018-11-05 DIAGNOSIS — R109 Unspecified abdominal pain: Secondary | ICD-10-CM | POA: Diagnosis not present

## 2018-11-05 DIAGNOSIS — F17210 Nicotine dependence, cigarettes, uncomplicated: Secondary | ICD-10-CM | POA: Insufficient documentation

## 2018-11-05 DIAGNOSIS — R05 Cough: Secondary | ICD-10-CM | POA: Diagnosis not present

## 2018-11-05 DIAGNOSIS — Z79899 Other long term (current) drug therapy: Secondary | ICD-10-CM | POA: Insufficient documentation

## 2018-11-05 DIAGNOSIS — R0602 Shortness of breath: Secondary | ICD-10-CM | POA: Diagnosis not present

## 2018-11-05 DIAGNOSIS — R6889 Other general symptoms and signs: Secondary | ICD-10-CM

## 2018-11-05 DIAGNOSIS — I10 Essential (primary) hypertension: Secondary | ICD-10-CM | POA: Diagnosis not present

## 2018-11-05 DIAGNOSIS — Z20828 Contact with and (suspected) exposure to other viral communicable diseases: Secondary | ICD-10-CM | POA: Diagnosis not present

## 2018-11-05 DIAGNOSIS — R197 Diarrhea, unspecified: Secondary | ICD-10-CM | POA: Insufficient documentation

## 2018-11-05 LAB — LIPASE, BLOOD: Lipase: 23 U/L (ref 11–51)

## 2018-11-05 LAB — CBC WITH DIFFERENTIAL/PLATELET
Abs Immature Granulocytes: 0.04 10*3/uL (ref 0.00–0.07)
Basophils Absolute: 0.1 10*3/uL (ref 0.0–0.1)
Basophils Relative: 1 %
Eosinophils Absolute: 0.4 10*3/uL (ref 0.0–0.5)
Eosinophils Relative: 4 %
HCT: 48.7 % — ABNORMAL HIGH (ref 36.0–46.0)
Hemoglobin: 16.3 g/dL — ABNORMAL HIGH (ref 12.0–15.0)
Immature Granulocytes: 0 %
Lymphocytes Relative: 41 %
Lymphs Abs: 4.5 10*3/uL — ABNORMAL HIGH (ref 0.7–4.0)
MCH: 32.1 pg (ref 26.0–34.0)
MCHC: 33.5 g/dL (ref 30.0–36.0)
MCV: 95.9 fL (ref 80.0–100.0)
Monocytes Absolute: 0.8 10*3/uL (ref 0.1–1.0)
Monocytes Relative: 8 %
Neutro Abs: 5.2 10*3/uL (ref 1.7–7.7)
Neutrophils Relative %: 46 %
Platelets: 337 10*3/uL (ref 150–400)
RBC: 5.08 MIL/uL (ref 3.87–5.11)
RDW: 12.8 % (ref 11.5–15.5)
WBC: 11.2 10*3/uL — ABNORMAL HIGH (ref 4.0–10.5)
nRBC: 0 % (ref 0.0–0.2)

## 2018-11-05 LAB — COMPREHENSIVE METABOLIC PANEL
ALT: 39 U/L (ref 0–44)
AST: 38 U/L (ref 15–41)
Albumin: 3.9 g/dL (ref 3.5–5.0)
Alkaline Phosphatase: 82 U/L (ref 38–126)
Anion gap: 9 (ref 5–15)
BUN: 10 mg/dL (ref 6–20)
CO2: 22 mmol/L (ref 22–32)
Calcium: 9 mg/dL (ref 8.9–10.3)
Chloride: 104 mmol/L (ref 98–111)
Creatinine, Ser: 0.62 mg/dL (ref 0.44–1.00)
GFR calc Af Amer: >60 mL/min (ref >60)
GFR calc non Af Amer: >60 mL/min (ref >60)
Glucose, Bld: 93 mg/dL (ref 70–99)
Potassium: 4.1 mmol/L (ref 3.5–5.1)
Sodium: 135 mmol/L (ref 135–145)
Total Bilirubin: 1.1 mg/dL (ref 0.3–1.2)
Total Protein: 8.3 g/dL — ABNORMAL HIGH (ref 6.5–8.1)

## 2018-11-05 LAB — INFLUENZA PANEL BY PCR (TYPE A & B)
Influenza A By PCR: NEGATIVE
Influenza B By PCR: NEGATIVE

## 2018-11-05 LAB — HCG, QUANTITATIVE, PREGNANCY: hCG, Beta Chain, Quant, S: 1 m[IU]/mL (ref <5)

## 2018-11-05 MED ORDER — SODIUM CHLORIDE 0.9 % IV BOLUS
1000.0000 mL | Freq: Once | INTRAVENOUS | Status: AC
  Administered 2018-11-05: 1000 mL via INTRAVENOUS

## 2018-11-05 MED ORDER — METOCLOPRAMIDE HCL 10 MG PO TABS: 10.0000 mg | ORAL_TABLET | Freq: Three times a day (TID) | ORAL | 0 refills | Status: DC | PRN

## 2018-11-05 MED ORDER — KETOROLAC TROMETHAMINE 30 MG/ML IJ SOLN
15.0000 mg | Freq: Once | INTRAMUSCULAR | Status: AC
  Administered 2018-11-05: 15 mg via INTRAVENOUS
  Filled 2018-11-05: qty 1

## 2018-11-05 MED ORDER — ACETAMINOPHEN 500 MG PO TABS
1000.0000 mg | ORAL_TABLET | Freq: Once | ORAL | Status: AC
  Administered 2018-11-05: 1000 mg via ORAL
  Filled 2018-11-05: qty 2

## 2018-11-05 MED ORDER — METOCLOPRAMIDE HCL 5 MG/ML IJ SOLN
10.0000 mg | Freq: Once | INTRAMUSCULAR | Status: AC
  Administered 2018-11-05: 10 mg via INTRAVENOUS
  Filled 2018-11-05: qty 2

## 2018-11-05 NOTE — ED Triage Notes (Signed)
Pt states generalized abd pain, vomiting, and diarrhea x 2 days. States fever at home. Took aleeve this AM. States cough but denies congestion. A&O, ambulatory. Mask in place. Afebrile in triage. Still had abd organs. Denies urinary symptoms.

## 2018-11-05 NOTE — ED Provider Notes (Signed)
[facility] Emergency Department Provider Note  ____________________________________________  Time seen: Approximately 12:02 PM  I have reviewed the triage vital signs and the nursing notes.   HISTORY  Chief Complaint Abdominal Pain   HPI Chelsea Nolan is a 38 y.o. female with h/o RA on remicade infusionwho presents for evaluation of flulike symptoms.  Patient reports 2 days of several daily episodes of nonbloody nonbilious emesis, watery diarrhea, dry cough, congestion, subjective fevers and chills.  She denies any known sick contact exposures or recent travel to endemic areas Covid 19.  She is also complaining of diffuse moderate cramping abdominal pain which has been constant for the last 2 days.  No chest pain.  She endorses mild shortness of breath.  Past Medical History:  Diagnosis Date  . Arthritis   . Hypertension   . Kidney stones   . Obesity   . Psoriatic arthritis [facility])    Dermatologist - Dr. Adolphus Birchwood, Rheumatologist - Dr. Gavin Potters  . Rheumatoid arthritis [facility])     Patient Active Problem List   Diagnosis Date Noted  . Hyperlipidemia 03/18/2016  . Chest pain 02/05/2016  . Migraine with aura 01/16/2015  . Renal stones 05/17/2014  . PTSD (post-traumatic stress disorder) 04/02/2014  . Family history of ovarian cancer 11/15/2013  . Psoriatic arthritis (HCC) 01/26/2013  . Essential hypertension, benign 01/26/2013  . Obesity, unspecified 01/26/2013    Past Surgical History:  Procedure Laterality Date  . ABDOMINAL HYSTERECTOMY    . CESAREAN SECTION     pre-eclampsia  . LITHOTRIPSY    . VAGINAL DELIVERY      Prior to Admission medications   Medication Sig Start Date End Date Taking? Authorizing Provider  amphetamine-dextroamphetamine (ADDERALL) 20 MG tablet Take 20 mg by mouth daily. 09/12/18  Yes [provider]  diazepam (VALIUM) 10 MG tablet Take 10 mg by mouth 3 (three) times daily.   Yes [provider]  prazosin  (MINIPRESS) 2 MG capsule Take 2-4 mg by mouth at bedtime.    Yes [provider]  Cetirizine HCl (ZYRTEC ALLERGY) 10 MG CAPS Take 1 capsule (10 mg total) by mouth daily. Patient not taking: Reported on 11/05/2018 08/12/18   Enid Derry, PA-C  ketorolac (ACULAR) 0.4 % SOLN Place 1 drop into the left eye 4 (four) times daily. Patient not taking: Reported on 11/05/2018 08/12/18   Enid Derry, PA-C  metoCLOPramide (REGLAN) 10 MG tablet Take 1 tablet (10 mg total) by mouth every 8 (eight) hours as needed for up to 3 days for nausea. 11/05/18 11/08/18  Nita Sickle, MD    Allergies Penicillins; Enbrel [etanercept]; Flexeril [cyclobenzaprine]; Prednisone; Sertraline; Topamax [topiramate]; Amoxicillin; and Zofran [ondansetron hcl]  Family History  Problem Relation Age of Onset  . Diabetes Mother   . Hyperlipidemia Mother   . Hypertension Mother   . Arthritis Mother   . Lupus Mother   . Obesity Sister   . Cancer Maternal Grandmother        ovarian    Social History Social History   Tobacco Use  . Smoking status: Current Every Day Smoker    Years: 15.00    Types: Cigarettes  . Smokeless tobacco: Never Used  Substance Use Topics  . Alcohol use: No  . Drug use: No    Review of Systems  Constitutional: + fever, chills Eyes: Negative for visual changes. ENT: Negative for sore throat. Neck: No neck pain  Cardiovascular: Negative for chest pain. Respiratory: + shortness of breath and  cough Gastrointestinal: + abdominal pain, vomiting and diarrhea. Genitourinary: Negative for dysuria. Musculoskeletal: Negative for back pain. Skin: Negative for rash. Neurological: Negative for headaches, weakness or numbness. Psych: No SI or HI  ____________________________________________   PHYSICAL EXAM:  VITAL SIGNS: ED Triage Vitals  Enc Vitals Group     BP 11/05/18 1143 (!) 155/107     Pulse Rate 11/05/18 1143 92     Resp 11/05/18 1143 20     Temp 11/05/18 1143 98 F (36.7  C)     Temp Source 11/05/18 1143 Oral     SpO2 11/05/18 1143 98 %     Weight 11/05/18 1144 195 lb (88.5 kg)     Height 11/05/18 1144 5\' 2"  (1.575 m)     Head Circumference --      Peak Flow --      Pain Score 11/05/18 1144 6     Pain Loc --      Pain Edu? --      Excl. in GC? --     Constitutional: Alert and oriented. Well appearing and in no apparent distress. HEENT:      Head: Normocephalic and atraumatic.         Eyes: Conjunctivae are normal. Sclera is non-icteric.       Mouth/Throat: Mucous membranes are moist.       Neck: Supple with no signs of meningismus. Cardiovascular: Regular rate and rhythm. No murmurs, gallops, or rubs. 2+ symmetrical distal pulses are present in all extremities. No JVD. Respiratory: Normal respiratory effort. Lungs are clear to auscultation bilaterally. No wheezes, crackles, or rhonchi.  Gastrointestinal: Soft, mild diffuse tenderness to palpation, and non distended with positive bowel sounds. No rebound or guarding. Musculoskeletal: Nontender with normal range of motion in all extremities. No edema, cyanosis, or erythema of extremities. Neurologic: Normal speech and language. Face is symmetric. Moving all extremities. No gross focal neurologic deficits are appreciated. Skin: Skin is warm, dry and intact. No rash noted. Psychiatric: Mood and affect are normal. Speech and behavior are normal.  ____________________________________________   LABS (all labs ordered are listed, but only abnormal results are displayed)  Labs Reviewed  COMPREHENSIVE METABOLIC PANEL - Abnormal; Notable for the following components:      Result Value   Total Protein 8.3 (*)    All other components within normal limits  CBC WITH DIFFERENTIAL/PLATELET - Abnormal; Notable for the following components:   WBC 11.2 (*)    Hemoglobin 16.3 (*)    HCT 48.7 (*)    Lymphs Abs 4.5 (*)    All other components within normal limits  NOVEL CORONAVIRUS, NAA (HOSPITAL ORDER, SEND-OUT  TO REF LAB)  LIPASE, BLOOD  HCG, QUANTITATIVE, PREGNANCY  INFLUENZA PANEL BY PCR (TYPE A & B)  URINALYSIS, COMPLETE (UACMP) WITH MICROSCOPIC   ____________________________________________  EKG  none  ____________________________________________  RADIOLOGY  I have personally reviewed the images performed during this visit and I agree with the Radiologist's read.   Interpretation by Radiologist:  Dg Chest Portable 1 View  Result Date: 11/05/2018 CLINICAL DATA:  Cough and shortness of breath EXAM: PORTABLE CHEST 1 VIEW COMPARISON:  08/20/2016 FINDINGS: Low volume chest which accentuates interstitial coarsening. No air bronchogram, effusion, or pneumothorax. Normal heart size. IMPRESSION: Low volume chest with equivocal bronchitic markings. No focal pneumonia. Electronically Signed   By: Marnee SpringJonathon  Watts M.D.   On: 11/05/2018 13:03     ____________________________________________   PROCEDURES  Procedure(s) performed: None Procedures Critical Care performed:  None  ____________________________________________   INITIAL IMPRESSION / ASSESSMENT AND PLAN / ED COURSE  38 y.o. female with h/o RA on remicade infusionwho presents for evaluation of flulike symptoms x 2 days.  Differential diagnosis including influenza versus pneumonia versus viral illness versus viral gastroenteritis.  At this time low suspicion for Covid infection based on history however since patient is immune suppressed on Remicade we will send a coronavirus swab.  Will give IV fluids, Toradol, Zofran.  Will check labs, chest x-ray and urinalysis.    _________________________ 2:55 PM on 11/05/2018 -----------------------------------------  Patient feels markedly improved, tolerating p.o., labs showing mild leukocytosis consistent with a viral syndrome, flu negative, chest x-ray negative for pneumonia, no AKI, dehydration, or electrolyte abnormalities.  Discussed quarantine with patient for herself and family members.   Discussed my standard return precautions and follow-up with primary care doctor.   As part of my medical decision making, I reviewed the following data within the electronic MEDICAL RECORD NUMBER Nursing notes reviewed and incorporated, Labs reviewed , Old chart reviewed, Radiograph reviewed , Notes from prior ED visits and [place] Controlled Substance Database    Pertinent labs & imaging results that were available during my care of the patient were reviewed by me and considered in my medical decision making (see chart for details).    ____________________________________________   FINAL CLINICAL IMPRESSION(S) / ED DIAGNOSES  Final diagnoses:  Flu-like symptoms      NEW MEDICATIONS STARTED DURING THIS VISIT:  ED Discharge Orders         Ordered    metoCLOPramide (REGLAN) 10 MG tablet  Every 8 hours PRN     11/05/18 1457           Note:  This document was prepared using Dragon voice recognition software and may include unintentional dictation errors.    Don Perking, [place], MD 11/05/18 [phone]

## 2018-11-05 NOTE — ED Notes (Signed)
Patient AAox4. Vitals stable. NAD.  

## 2018-11-05 NOTE — Discharge Instructions (Signed)
QUARANTINE INSTRUCTION ° °Follow these instructions at home: ° °Protecting others °To avoid spreading the illness to other people: °Quarantine in your home until you have had no cough and fever for 10 days. Household members should also be quarantine for at least 14 days after being exposed to you. °Wash your hands often with soap and water. If soap and water are not available, use an alcohol-based hand sanitizer. If you have not cleaned your hands, do not touch your face. °Make sure that all people in your household wash their hands well and often. °Cover your nose and mouth when you cough or sneeze. °Throw away used tissues. °Stay home if you have any cold-like or flu-like symptoms. °General instructions °Take over-the-counter and prescription medicines only as told by your health care provider. °If you need medication for fever take tylenol and avoid NSAIDs °Drink enough fluid to keep your urine pale yellow. °Rest at home as directed by your health care provider. °Do not give aspirin to a child with the flu, because of the association with Reye's syndrome. °Do not use tobacco products, including cigarettes, chewing tobacco, and e-cigarettes. If you need help quitting, ask your health care provider. °Keep all follow-up visits as told by your health care provider. This is important. °How is this prevented? °Avoid areas where an outbreak has been reported. °Avoid large groups of people. °Keep a safe distance from people who are coughing and sneezing. °Do not touch your face if you have not cleaned your hands. °When you are around people who are sick or might be sick, wear a mask to protect yourself. °Contact a health care provider if: °You have symptoms of SARS (cough, fever, chest pain, shortness of breath) that are not getting better at home. °You have a fever. °If you have no difficulty breathing contact your doctor or call Poison Control COVID hotline at 1-866-462-3821. If you have difficulty breathing go to  your local ER or call 911  ° °

## 2018-11-07 ENCOUNTER — Telehealth: Payer: Self-pay | Admitting: Emergency Medicine

## 2018-11-07 LAB — NOVEL CORONAVIRUS, NAA (HOSP ORDER, SEND-OUT TO REF LAB; TAT 18-24 HRS): SARS-CoV-2, NAA: NOT DETECTED

## 2018-11-07 NOTE — Telephone Encounter (Signed)
Called patient to inform of negative covid 19 test.  Left message.

## 2019-05-23 NOTE — Telephone Encounter (Signed)
Error

## 2019-06-20 DIAGNOSIS — F900 Attention-deficit hyperactivity disorder, predominantly inattentive type: Secondary | ICD-10-CM | POA: Insufficient documentation

## 2019-07-12 ENCOUNTER — Encounter: Payer: Self-pay | Admitting: Emergency Medicine

## 2019-07-12 ENCOUNTER — Emergency Department: Payer: Medicare Other

## 2019-07-12 ENCOUNTER — Other Ambulatory Visit: Payer: Self-pay

## 2019-07-12 ENCOUNTER — Inpatient Hospital Stay
Admission: EM | Admit: 2019-07-12 | Discharge: 2019-07-13 | DRG: 202 | Discharge status: Against Medical Advice | Payer: Medicare Other | Attending: Internal Medicine | Admitting: Internal Medicine

## 2019-07-12 DIAGNOSIS — F17210 Nicotine dependence, cigarettes, uncomplicated: Secondary | ICD-10-CM | POA: Diagnosis present

## 2019-07-12 DIAGNOSIS — R0902 Hypoxemia: Secondary | ICD-10-CM

## 2019-07-12 DIAGNOSIS — J9601 Acute respiratory failure with hypoxia: Secondary | ICD-10-CM | POA: Diagnosis present

## 2019-07-12 DIAGNOSIS — A419 Sepsis, unspecified organism: Secondary | ICD-10-CM

## 2019-07-12 DIAGNOSIS — M069 Rheumatoid arthritis, unspecified: Secondary | ICD-10-CM | POA: Diagnosis present

## 2019-07-12 DIAGNOSIS — J40 Bronchitis, not specified as acute or chronic: Secondary | ICD-10-CM

## 2019-07-12 DIAGNOSIS — R0600 Dyspnea, unspecified: Secondary | ICD-10-CM

## 2019-07-12 DIAGNOSIS — R0602 Shortness of breath: Secondary | ICD-10-CM

## 2019-07-12 DIAGNOSIS — F4310 Post-traumatic stress disorder, unspecified: Secondary | ICD-10-CM | POA: Diagnosis present

## 2019-07-12 DIAGNOSIS — Z79899 Other long term (current) drug therapy: Secondary | ICD-10-CM

## 2019-07-12 DIAGNOSIS — J209 Acute bronchitis, unspecified: Principal | ICD-10-CM | POA: Diagnosis present

## 2019-07-12 DIAGNOSIS — Z90710 Acquired absence of both cervix and uterus: Secondary | ICD-10-CM

## 2019-07-12 DIAGNOSIS — I10 Essential (primary) hypertension: Secondary | ICD-10-CM | POA: Diagnosis present

## 2019-07-12 DIAGNOSIS — Z880 Allergy status to penicillin: Secondary | ICD-10-CM

## 2019-07-12 DIAGNOSIS — Z20828 Contact with and (suspected) exposure to other viral communicable diseases: Secondary | ICD-10-CM | POA: Diagnosis present

## 2019-07-12 LAB — COMPREHENSIVE METABOLIC PANEL
ALT: 35 U/L (ref 0–44)
AST: 29 U/L (ref 15–41)
Albumin: 3.6 g/dL (ref 3.5–5.0)
Alkaline Phosphatase: 106 U/L (ref 38–126)
Anion gap: 11 (ref 5–15)
BUN: 12 mg/dL (ref 6–20)
CO2: 21 mmol/L — ABNORMAL LOW (ref 22–32)
Calcium: 8.9 mg/dL (ref 8.9–10.3)
Chloride: 106 mmol/L (ref 98–111)
Creatinine, Ser: 0.64 mg/dL (ref 0.44–1.00)
GFR calc Af Amer: >60 mL/min (ref >60)
GFR calc non Af Amer: >60 mL/min (ref >60)
Glucose, Bld: 236 mg/dL — ABNORMAL HIGH (ref 70–99)
Potassium: 3.5 mmol/L (ref 3.5–5.1)
Sodium: 138 mmol/L (ref 135–145)
Total Bilirubin: 0.4 mg/dL (ref 0.3–1.2)
Total Protein: 7.7 g/dL (ref 6.5–8.1)

## 2019-07-12 LAB — CBC WITH DIFFERENTIAL/PLATELET
Abs Immature Granulocytes: 0.24 10*3/uL — ABNORMAL HIGH (ref 0.00–0.07)
Basophils Absolute: 0.1 10*3/uL (ref 0.0–0.1)
Basophils Relative: 0 %
Eosinophils Absolute: 0.1 10*3/uL (ref 0.0–0.5)
Eosinophils Relative: 1 %
HCT: 45.4 % (ref 36.0–46.0)
Hemoglobin: 15.1 g/dL — ABNORMAL HIGH (ref 12.0–15.0)
Immature Granulocytes: 2 %
Lymphocytes Relative: 37 %
Lymphs Abs: 5.3 10*3/uL — ABNORMAL HIGH (ref 0.7–4.0)
MCH: 32.1 pg (ref 26.0–34.0)
MCHC: 33.3 g/dL (ref 30.0–36.0)
MCV: 96.4 fL (ref 80.0–100.0)
Monocytes Absolute: 1.1 10*3/uL — ABNORMAL HIGH (ref 0.1–1.0)
Monocytes Relative: 8 %
Neutro Abs: 7.7 10*3/uL (ref 1.7–7.7)
Neutrophils Relative %: 52 %
Platelets: 383 10*3/uL (ref 150–400)
RBC: 4.71 MIL/uL (ref 3.87–5.11)
RDW: 13.2 % (ref 11.5–15.5)
Smear Review: NORMAL
WBC: 14.4 10*3/uL — ABNORMAL HIGH (ref 4.0–10.5)
nRBC: 0 % (ref 0.0–0.2)

## 2019-07-12 LAB — TROPONIN I (HIGH SENSITIVITY)
Troponin I (High Sensitivity): 4 ng/L (ref <18)
Troponin I (High Sensitivity): 3 ng/L (ref <18)

## 2019-07-12 LAB — BRAIN NATRIURETIC PEPTIDE: B Natriuretic Peptide: 57 pg/mL (ref 0.0–100.0)

## 2019-07-12 LAB — LACTIC ACID, PLASMA
Lactic Acid, Venous: 2.6 mmol/L (ref 0.5–1.9)
Lactic Acid, Venous: 2.8 mmol/L (ref 0.5–1.9)

## 2019-07-12 LAB — RESPIRATORY PANEL BY RT PCR (FLU A&B, COVID)
Influenza A by PCR: NEGATIVE
Influenza B by PCR: NEGATIVE
SARS Coronavirus 2 by RT PCR: NEGATIVE

## 2019-07-12 LAB — FIBRIN DERIVATIVES D-DIMER (ARMC ONLY): Fibrin derivatives D-dimer (ARMC): 183.13 ng/mL (FEU) (ref 0.00–499.00)

## 2019-07-12 MED ORDER — SODIUM CHLORIDE 0.9 % IV SOLN
500.0000 mg | Freq: Once | INTRAVENOUS | Status: AC
  Administered 2019-07-12: 500 mg via INTRAVENOUS
  Filled 2019-07-12: qty 500

## 2019-07-12 MED ORDER — ALBUTEROL SULFATE (2.5 MG/3ML) 0.083% IN NEBU
5.0000 mg | INHALATION_SOLUTION | Freq: Once | RESPIRATORY_TRACT | Status: AC
  Administered 2019-07-12: 5 mg via RESPIRATORY_TRACT
  Filled 2019-07-12: qty 6

## 2019-07-12 MED ORDER — METHYLPREDNISOLONE SODIUM SUCC 125 MG IJ SOLR
125.0000 mg | Freq: Once | INTRAMUSCULAR | Status: AC
  Administered 2019-07-12: 125 mg via INTRAVENOUS
  Filled 2019-07-12: qty 2

## 2019-07-12 MED ORDER — IPRATROPIUM-ALBUTEROL 0.5-2.5 (3) MG/3ML IN SOLN
3.0000 mL | Freq: Once | RESPIRATORY_TRACT | Status: AC
  Administered 2019-07-12: 3 mL via RESPIRATORY_TRACT
  Filled 2019-07-12: qty 3

## 2019-07-12 MED ORDER — KETOROLAC TROMETHAMINE 30 MG/ML IJ SOLN
15.0000 mg | Freq: Once | INTRAMUSCULAR | Status: AC
  Administered 2019-07-12: 15 mg via INTRAVENOUS
  Filled 2019-07-12: qty 1

## 2019-07-12 MED ORDER — MAGNESIUM SULFATE 2 GM/50ML IV SOLN
2.0000 g | Freq: Once | INTRAVENOUS | Status: AC
  Administered 2019-07-12: 2 g via INTRAVENOUS
  Filled 2019-07-12: qty 50

## 2019-07-12 MED ORDER — IPRATROPIUM-ALBUTEROL 0.5-2.5 (3) MG/3ML IN SOLN: 3.0000 mL | Freq: Once | RESPIRATORY_TRACT | Status: AC

## 2019-07-12 MED ORDER — IPRATROPIUM-ALBUTEROL 0.5-2.5 (3) MG/3ML IN SOLN
RESPIRATORY_TRACT | Status: AC
  Administered 2019-07-12: 3 mL via RESPIRATORY_TRACT
  Filled 2019-07-12: qty 3

## 2019-07-12 MED ORDER — SODIUM CHLORIDE 0.9 % IV SOLN
1.0000 g | Freq: Once | INTRAVENOUS | Status: AC
  Administered 2019-07-12: 1 g via INTRAVENOUS
  Filled 2019-07-12: qty 10

## 2019-07-12 MED ORDER — MORPHINE SULFATE (PF) 2 MG/ML IV SOLN
2.0000 mg | Freq: Once | INTRAVENOUS | Status: AC
  Administered 2019-07-12: 2 mg via INTRAVENOUS
  Filled 2019-07-12: qty 1

## 2019-07-12 NOTE — ED Notes (Signed)
Dr Cinda Quest at bedside, pt tolerating breathing treatment well.

## 2019-07-12 NOTE — ED Provider Notes (Addendum)
[facility] Emergency Department Provider Note   ____________________________________________   First MD Initiated Contact with Patient 07/12/19 1844     (approximate)  I have reviewed the triage vital signs and the nursing notes.   HISTORY  Chief Complaint Shortness of Breath    HPI Chelsea Nolan is a 38 y.o. female who reports coughing up green and yellow phlegm for several days.  Her chest is aching from the coughing.  Palpation reproduces the pain.  She was seen in [place] with a negative Covid test couple days ago.  She is not running a fever today.  Shortness of breath is worse with exertion.  She has an albuterol nebulizer which she has been using but has not been helping her lately.        Past Medical History:  Diagnosis Date  . Arthritis   . Kidney stones   . Obesity   . Psoriatic arthritis [facility])    Dermatologist - Dr. Adolphus Birchwood, Rheumatologist - Dr. Gavin Potters  . Rheumatoid arthritis [facility])     Patient Active Problem List   Diagnosis Date Noted  . Hyperlipidemia 03/18/2016  . Chest pain 02/05/2016  . Migraine with aura 01/16/2015  . Renal stones 05/17/2014  . PTSD (post-traumatic stress disorder) 04/02/2014  . Family history of ovarian cancer 11/15/2013  . Psoriatic arthritis (HCC) 01/26/2013  . Essential hypertension, benign 01/26/2013  . Obesity, unspecified 01/26/2013    Past Surgical History:  Procedure Laterality Date  . ABDOMINAL HYSTERECTOMY    . CESAREAN SECTION     pre-eclampsia  . LITHOTRIPSY    . VAGINAL DELIVERY      Prior to Admission medications   Medication Sig Start Date End Date Taking? Authorizing Provider  amphetamine-dextroamphetamine (ADDERALL) 20 MG tablet Take 20 mg by mouth daily. 09/12/18   [provider]  Cetirizine HCl (ZYRTEC ALLERGY) 10 MG CAPS Take 1 capsule (10 mg total) by mouth daily. Patient not taking: Reported on 11/05/2018 08/12/18   Enid Derry, PA-C  diazepam (VALIUM) 10 MG  tablet Take 10 mg by mouth 3 (three) times daily.    [provider]  ketorolac (ACULAR) 0.4 % SOLN Place 1 drop into the left eye 4 (four) times daily. Patient not taking: Reported on 11/05/2018 08/12/18   Enid Derry, PA-C  metoCLOPramide (REGLAN) 10 MG tablet Take 1 tablet (10 mg total) by mouth every 8 (eight) hours as needed for up to 3 days for nausea. 11/05/18 11/08/18  Nita Sickle, MD  prazosin (MINIPRESS) 2 MG capsule Take 2-4 mg by mouth at bedtime.     [provider]    Allergies Penicillins, Enbrel [etanercept], Flexeril [cyclobenzaprine], Prednisone, Sertraline, Topamax [topiramate], Amoxicillin, and Zofran [ondansetron hcl]  Family History  Problem Relation Age of Onset  . Diabetes Mother   . Hyperlipidemia Mother   . Hypertension Mother   . Arthritis Mother   . Lupus Mother   . Obesity Sister   . Cancer Maternal Grandmother        ovarian    Social History Social History   Tobacco Use  . Smoking status: Current Every Day Smoker    Years: 15.00    Types: Cigarettes  . Smokeless tobacco: Never Used  Substance Use Topics  . Alcohol use: No  . Drug use: No    Review of Systems  Constitutional: No fever/chills Eyes: No visual changes. ENT: No sore throat. Cardiovascular:  chest pain. Respiratory:  shortness of breath. Gastrointestinal: No abdominal pain.  No nausea, no vomiting.  No diarrhea.  No constipation. Genitourinary: Negative for dysuria. Musculoskeletal: Negative for back pain. Skin: Negative for rash. Neurological: Negative for headaches, focal weakness     ____________________________________________   PHYSICAL EXAM:  VITAL SIGNS: ED Triage Vitals  Enc Vitals Group     BP 07/12/19 1847 (!) 173/125     Pulse Rate 07/12/19 1843 93     Resp 07/12/19 1843 (!) 28     Temp 07/12/19 1843 98.7 F (37.1 C)     Temp Source 07/12/19 1843 Oral     SpO2 07/12/19 1843 96 %     Weight 07/12/19 1842 200 lb (90.7 kg)      Height 07/12/19 1842 5\' 2"  (1.575 m)     Head Circumference --      Peak Flow --      Pain Score 07/12/19 1842 7     Pain Loc --      Pain Edu? --      Excl. in [place]? --     Constitutional: Alert and oriented.  Looks tired and sick Eyes: Conjunctivae are normal.  Head: Atraumatic. Nose: No congestion/rhinnorhea. Mouth/Throat: Mucous membranes are moist.  Oropharynx non-erythematous. Neck: No stridor.   Cardiovascular: Normal rate, regular rhythm. Grossly normal heart sounds.  Good peripheral circulation.  Palpation of chest wall exactly reproduces her pain Respiratory: Increased respiratory effort.  No retractions. Lungs diffuse wheezes Gastrointestinal: Soft and nontender. No distention. No abdominal bruits. No CVA tenderness. Musculoskeletal: No lower extremity tenderness nor edema.   Neurologic:  Normal speech and language. No gross focal neurologic deficits are appreciated.  Skin:  Skin is warm, dry and intact. No rash noted.  ____________________________________________   LABS (all labs ordered are listed, but only abnormal results are displayed)  Labs Reviewed  COMPREHENSIVE METABOLIC PANEL - Abnormal; Notable for the following components:      Result Value   CO2 21 (*)    Glucose, Bld 236 (*)    All other components within normal limits  LACTIC ACID, PLASMA - Abnormal; Notable for the following components:   Lactic Acid, Venous 2.6 (*)    All other components within normal limits  CBC WITH DIFFERENTIAL/PLATELET - Abnormal; Notable for the following components:   WBC 14.4 (*)    Hemoglobin 15.1 (*)    Lymphs Abs 5.3 (*)    Monocytes Absolute 1.1 (*)    Abs Immature Granulocytes 0.24 (*)    All other components within normal limits  SARS CORONAVIRUS 2 (TAT 6-24 HRS)  BRAIN NATRIURETIC PEPTIDE  FIBRIN DERIVATIVES D-DIMER ([facility] ONLY)  LACTIC ACID, PLASMA  LACTIC ACID, PLASMA  TROPONIN I (HIGH SENSITIVITY)  TROPONIN I (HIGH SENSITIVITY)    ____________________________________________  EKG   ____________________________________________  RADIOLOGY  ED MD interpretation:    Official radiology report(s): Dg Chest Port 1 View  Result Date: 07/12/2019 CLINICAL DATA:  Shortness of breath EXAM: PORTABLE CHEST 1 VIEW COMPARISON:  November 05, 2018 FINDINGS: The lung volumes are low. The cardiac silhouette is enlarged. There are prominent interstitial lung markings bilaterally which is similar to prior study. There is elevation of the right hemidiaphragm. No pneumothorax. No large pleural effusion. No acute osseous abnormality. IMPRESSION: Low lung volumes.  No definite focal infiltrate. Electronically Signed   By: Constance Holster M.D.   On: 07/12/2019 19:43    ____________________________________________   PROCEDURES  Procedure(s) performed (including Critical Care):  Procedures   ____________________________________________   INITIAL IMPRESSION / ASSESSMENT AND PLAN /  ED COURSE  DAZHA KEMPA was evaluated in Emergency Department on 07/12/2019 for the symptoms described in the history of present illness. She was evaluated in the context of the global COVID-19 pandemic, which necessitated consideration that the patient might be at risk for infection with the SARS-CoV-2 virus that causes COVID-19. Institutional protocols and algorithms that pertain to the evaluation of patients at risk for COVID-19 are in a state of rapid change based on information released by regulatory bodies including the CDC and federal and state organizations. These policies and algorithms were followed during the patient's care in the ED.    Patient's white count is elevated and her lactic acid is elevated.  She has an oxygen requirement.  Laying flat in bed without oxygen she has oxygen saturations at 90 or 89.  With exertion these dropped.  Patient has had 2 duo nebs and albuterol nebs magnesium and steroids without any relief.  We will have to get her in  the hospital.  On 4 L patient has been satting 91 sitting.  On 6 L she goes up to 96-97.          ____________________________________________   FINAL CLINICAL IMPRESSION(S) / ED DIAGNOSES  Final diagnoses:  Hypoxia  Bronchitis  Dyspnea, unspecified type     ED Discharge Orders    None       Note:  This document was prepared using Dragon voice recognition software and may include unintentional dictation errors.    Arnaldo Natal, MD 07/12/19 2118    Arnaldo Natal, MD 07/12/19 2121    Arnaldo Natal, MD 07/12/19 2222

## 2019-07-12 NOTE — ED Notes (Signed)
Pt O2 sat at 91 on room air, is currently 90 on 3L, increased to 4L at this time.

## 2019-07-12 NOTE — ED Notes (Signed)
Pt's O2 sat at 99% on 6L, reduced to 5L, currently at 96%.

## 2019-07-12 NOTE — ED Notes (Signed)
Pt placed on 6L at this time Per MD Cinda Quest, MD at bedside.

## 2019-07-12 NOTE — ED Triage Notes (Addendum)
Here for [facility] starting early this week. Seen at [facility] with negative covid. Labored and tachypnea noted at check in.  Some audible wheezing.  + cough.

## 2019-07-12 NOTE — ED Notes (Signed)
Report given to Gracie RN.

## 2019-07-13 ENCOUNTER — Encounter: Payer: Self-pay | Admitting: Internal Medicine

## 2019-07-13 DIAGNOSIS — Z880 Allergy status to penicillin: Secondary | ICD-10-CM | POA: Diagnosis not present

## 2019-07-13 DIAGNOSIS — Z20828 Contact with and (suspected) exposure to other viral communicable diseases: Secondary | ICD-10-CM | POA: Diagnosis present

## 2019-07-13 DIAGNOSIS — F4310 Post-traumatic stress disorder, unspecified: Secondary | ICD-10-CM | POA: Diagnosis present

## 2019-07-13 DIAGNOSIS — J9601 Acute respiratory failure with hypoxia: Secondary | ICD-10-CM | POA: Diagnosis present

## 2019-07-13 DIAGNOSIS — I10 Essential (primary) hypertension: Secondary | ICD-10-CM | POA: Diagnosis present

## 2019-07-13 DIAGNOSIS — Z90710 Acquired absence of both cervix and uterus: Secondary | ICD-10-CM | POA: Diagnosis not present

## 2019-07-13 DIAGNOSIS — J209 Acute bronchitis, unspecified: Secondary | ICD-10-CM | POA: Diagnosis present

## 2019-07-13 DIAGNOSIS — Z79899 Other long term (current) drug therapy: Secondary | ICD-10-CM | POA: Diagnosis not present

## 2019-07-13 DIAGNOSIS — F17210 Nicotine dependence, cigarettes, uncomplicated: Secondary | ICD-10-CM | POA: Diagnosis present

## 2019-07-13 DIAGNOSIS — M069 Rheumatoid arthritis, unspecified: Secondary | ICD-10-CM | POA: Diagnosis present

## 2019-07-13 LAB — CBC WITH DIFFERENTIAL/PLATELET
Abs Immature Granulocytes: 0.27 10*3/uL — ABNORMAL HIGH (ref 0.00–0.07)
Basophils Absolute: 0.1 10*3/uL (ref 0.0–0.1)
Basophils Relative: 0 %
Eosinophils Absolute: 0 10*3/uL (ref 0.0–0.5)
Eosinophils Relative: 0 %
HCT: 43.6 % (ref 36.0–46.0)
Hemoglobin: 14.3 g/dL (ref 12.0–15.0)
Immature Granulocytes: 2 %
Lymphocytes Relative: 17 %
Lymphs Abs: 2.7 10*3/uL (ref 0.7–4.0)
MCH: 32 pg (ref 26.0–34.0)
MCHC: 32.8 g/dL (ref 30.0–36.0)
MCV: 97.5 fL (ref 80.0–100.0)
Monocytes Absolute: 0.7 10*3/uL (ref 0.1–1.0)
Monocytes Relative: 5 %
Neutro Abs: 12.2 10*3/uL — ABNORMAL HIGH (ref 1.7–7.7)
Neutrophils Relative %: 76 %
Platelets: 350 10*3/uL (ref 150–400)
RBC: 4.47 MIL/uL (ref 3.87–5.11)
RDW: 13.2 % (ref 11.5–15.5)
WBC: 15.9 10*3/uL — ABNORMAL HIGH (ref 4.0–10.5)
nRBC: 0 % (ref 0.0–0.2)

## 2019-07-13 LAB — PROTIME-INR
INR: 0.9 (ref 0.8–1.2)
Prothrombin Time: 12.2 seconds (ref 11.4–15.2)

## 2019-07-13 LAB — HIV ANTIBODY (ROUTINE TESTING W REFLEX): HIV Screen 4th Generation wRfx: NONREACTIVE

## 2019-07-13 LAB — SARS CORONAVIRUS 2 (TAT 6-24 HRS): SARS Coronavirus 2: NEGATIVE

## 2019-07-13 LAB — LACTIC ACID, PLASMA: Lactic Acid, Venous: 3.7 mmol/L (ref 0.5–1.9)

## 2019-07-13 LAB — APTT: aPTT: 26 seconds (ref 24–36)

## 2019-07-13 MED ORDER — PRAZOSIN HCL 2 MG PO CAPS
2.0000 mg | ORAL_CAPSULE | Freq: Every day | ORAL | Status: DC
  Filled 2019-07-13: qty 2

## 2019-07-13 MED ORDER — METHYLPREDNISOLONE SODIUM SUCC 125 MG IJ SOLR
60.0000 mg | Freq: Two times a day (BID) | INTRAMUSCULAR | Status: DC
  Administered 2019-07-13: 60 mg via INTRAVENOUS
  Filled 2019-07-13: qty 0.96
  Filled 2019-07-13: qty 2

## 2019-07-13 MED ORDER — SODIUM CHLORIDE 0.9 % IV SOLN
INTRAVENOUS | Status: DC
  Administered 2019-07-13: 05:00:00 via INTRAVENOUS

## 2019-07-13 MED ORDER — ACETAMINOPHEN 500 MG PO TABS
ORAL_TABLET | ORAL | Status: AC
  Administered 2019-07-13: 1000 mg via ORAL
  Filled 2019-07-13: qty 2

## 2019-07-13 MED ORDER — AMPHETAMINE-DEXTROAMPHETAMINE 5 MG PO TABS: 30.0000 mg | ORAL_TABLET | Freq: Two times a day (BID) | ORAL | Status: DC

## 2019-07-13 MED ORDER — LEVOFLOXACIN IN D5W 750 MG/150ML IV SOLN
750.0000 mg | Freq: Once | INTRAVENOUS | Status: AC
  Administered 2019-07-13: 750 mg via INTRAVENOUS
  Filled 2019-07-13: qty 150

## 2019-07-13 MED ORDER — IPRATROPIUM-ALBUTEROL 0.5-2.5 (3) MG/3ML IN SOLN
3.0000 mL | RESPIRATORY_TRACT | Status: DC
  Administered 2019-07-13 (×2): 3 mL via RESPIRATORY_TRACT
  Filled 2019-07-13 (×2): qty 3

## 2019-07-13 MED ORDER — ACETAMINOPHEN 500 MG PO TABS: 1000.0000 mg | ORAL_TABLET | Freq: Once | ORAL | Status: AC

## 2019-07-13 NOTE — H&P (Signed)
History and Physical    Chelsea Nolan FGH:[id number] DOB: October 10, 1980 DOA: 07/12/2019  PCP: Midge Minium, PA (Confirm with patient/family/NH records and if not entered, this has to be entered at [facility] point of entry) Patient coming from: home  I have personally briefly reviewed patient's old medical records in [place]  Chief Complaint: shortness of breath  HPI: Chelsea Nolan is a 38 y.o. female with medical history significant for hypertension, rheumatoid arthritis and psoriatic arthritis followed by rheumatology at [facility] well as history of asthma who presented to the emergency room with 5-day history of wheezing not responding to her home bronchodilator therapy.  Was seen at the previous emergency room 2 days prior and was tested negative for Covid.  She states she has sharp chest pain on coughing that she feels sleep to the upper right chest.  She denies fever or chills.  Denies nausea and vomiting, abdominal pain or change in bowel habits.  On arrival in the emergency room he was hypertensive with a blood pressure of 173/125 and was tachypneic with RR of 28. O2 sat was reportably the high 80s and she had to be placed on oxygen improvement to the low 90s.  He was afebrile and pulse was 93.  In the emergency room, she tested negative for both Covid and influenza.  White cell count was elevated at 14,000 and she had an elevated lactic acid of 2.8.  Troponin test was normal.  Chest x-ray showed no acute infiltrate.  She was treated with an IV fluid bolus added on was given IV azithromycin and ceftriaxone bolus magnesium sulfate and methylprednisolone.  He received several bronchodilator treatment due to inadequate improvement she was referred for admission.    Review of Systems: As per HPI otherwise 10 point review of systems negative.   Past Medical History:  Diagnosis Date  . Arthritis   . Kidney stones   . Obesity   . Psoriatic arthritis [facility])    Dermatologist - Dr. Evorn Gong,  Rheumatologist - Dr. Jefm Bryant  . Rheumatoid arthritis [facility])     Past Surgical History:  Procedure Laterality Date  . ABDOMINAL HYSTERECTOMY    . CESAREAN SECTION     pre-eclampsia  . LITHOTRIPSY    . VAGINAL DELIVERY       reports that she has been smoking cigarettes. She has smoked for the past 15.00 years. She has never used smokeless tobacco. She reports that she does not drink alcohol or use drugs.  Allergies  Allergen Reactions  . Penicillins Rash  . Enbrel [Etanercept] Other (See Comments)    Reaction:  Abdominal pain  . Flexeril [Cyclobenzaprine]   . Prednisone Nausea Only  . Sertraline Other (See Comments)    Reaction:  Suicidal ideation  . Topamax [Topiramate] Other (See Comments)    Reaction:  Tingling   . Amoxicillin Rash  . Zofran [Ondansetron Hcl] Rash and Other (See Comments)    Reaction:  Headache    Family History  Problem Relation Age of Onset  . Diabetes Mother   . Hyperlipidemia Mother   . Hypertension Mother   . Arthritis Mother   . Lupus Mother   . Obesity Sister   . Cancer Maternal Grandmother        ovarian   Family history reviewed  Prior to Admission medications   Medication Sig Start Date End Date Taking? Authorizing Provider  alfuzosin (UROXATRAL) 10 MG 24 hr tablet Take 10 mg by mouth daily. 07/05/19  Yes [provider]  amphetamine-dextroamphetamine (ADDERALL) 30 MG tablet Take 1 tablet by mouth 2 (two) times daily. 06/20/19  Yes [provider]  diazepam (VALIUM) 10 MG tablet Take 10 mg by mouth 3 (three) times daily.   Yes [provider]  gabapentin (NEURONTIN) 400 MG capsule Take 400 mg by mouth 4 (four) times daily. 06/16/19  Yes [provider]  prazosin (MINIPRESS) 2 MG capsule Take 2-4 mg by mouth at bedtime.    Yes [provider]  predniSONE (DELTASONE) 20 MG tablet Take 20 mg by mouth 3 (three) times daily.  07/10/19  Yes [provider]  VENTOLIN HFA 108 (90 Base) MCG/ACT  inhaler Inhale 1-2 puffs into the lungs every 4 (four) hours as needed.  07/10/19  Yes [provider]  fluvoxaMINE (LUVOX) 25 MG tablet Take 25 mg by mouth 2 (two) times daily. 06/21/19   [provider]  promethazine (PHENERGAN) 25 MG tablet Take 25 mg by mouth every 6 (six) hours as needed. 07/12/19   [provider]    Physical Exam: Vitals:   07/13/19 0400 07/13/19 0430 07/13/19 0500 07/13/19 0530  BP: 130/80 (!) 142/91 (!) 143/92 132/83  Pulse: 80 81 83 83  Resp: (!) 23 (!) 24 (!) 33 (!) 23  Temp:      TempSrc:      SpO2: 96% 92% 91% 94%  Weight:      Height:        Constitutional: NAD, calm, comfortable Vitals:   07/13/19 0400 07/13/19 0430 07/13/19 0500 07/13/19 0530  BP: 130/80 (!) 142/91 (!) 143/92 132/83  Pulse: 80 81 83 83  Resp: (!) 23 (!) 24 (!) 33 (!) 23  Temp:      TempSrc:      SpO2: 96% 92% 91% 94%  Weight:      Height:       Eyes: PERRL, lids and conjunctivae normal ENMT: Mucous membranes are moist. Posterior pharynx clear of any exudate or lesions.Normal dentition.  Neck: normal, supple, no masses, no thyromegaly Respiratory: Patient with conversational tachypnea, use of accessory muscles.  Using throughout all lung fields. Cardiovascular: Regular rate and rhythm, no murmurs / rubs / gallops. No extremity edema. 2+ pedal pulses. No carotid bruits.  Abdomen: no tenderness, no masses palpated. No hepatosplenomegaly. Bowel sounds positive.  Musculoskeletal: no clubbing / cyanosis. No joint deformity upper and lower extremities. Good ROM, no contractures. Normal muscle tone.  Skin: no rashes, lesions, ulcers. No induration Neurologic: CN 2-12 grossly intact. Sensation intact, DTR normal. Strength 5/5 in all 4.  Psychiatric: Normal judgment and insight. Alert and oriented x 3. Normal mood.     Labs on Admission: I have personally reviewed following labs and imaging studies  CBC: Recent Labs  Lab 07/12/19 1855  WBC 14.4*   NEUTROABS 7.7  HGB 15.1*  HCT 45.4  MCV 96.4  PLT 383   Basic Metabolic Panel: Recent Labs  Lab 07/12/19 1855  NA 138  K 3.5  CL 106  CO2 21*  GLUCOSE 236*  BUN 12  CREATININE 0.64  CALCIUM 8.9   GFR: Estimated Creatinine Clearance: 100.8 mL/min (by C-G formula based on SCr of 0.64 mg/dL). Liver Function Tests: Recent Labs  Lab 07/12/19 1855  AST 29  ALT 35  ALKPHOS 106  BILITOT 0.4  PROT 7.7  ALBUMIN 3.6   No results for input(s): LIPASE, AMYLASE in the last 168 hours. No results for input(s): AMMONIA in the last 168 hours. Coagulation Profile:  No results for input(s): INR, PROTIME in the last 168 hours. Cardiac Enzymes: No results for input(s): CKTOTAL, CKMB, CKMBINDEX, TROPONINI in the last 168 hours. BNP (last 3 results) No results for input(s): PROBNP in the last 8760 hours. HbA1C: No results for input(s): HGBA1C in the last 72 hours. CBG: No results for input(s): GLUCAP in the last 168 hours. Lipid Profile: No results for input(s): CHOL, HDL, LDLCALC, TRIG, CHOLHDL, LDLDIRECT in the last 72 hours. Thyroid Function Tests: No results for input(s): TSH, T4TOTAL, FREET4, T3FREE, THYROIDAB in the last 72 hours. Anemia Panel: No results for input(s): VITAMINB12, FOLATE, FERRITIN, TIBC, IRON, RETICCTPCT in the last 72 hours. Urine analysis:    Component Value Date/Time   COLORURINE AMBER (A) 03/06/2017 0348   APPEARANCEUR CLOUDY (A) 03/06/2017 0348   APPEARANCEUR Cloudy 09/02/2014 2034   LABSPEC 1.023 03/06/2017 0348   LABSPEC 1.021 09/02/2014 2034   PHURINE 5.0 03/06/2017 0348   GLUCOSEU NEGATIVE 03/06/2017 0348   GLUCOSEU Negative 09/02/2014 2034   HGBUR LARGE (A) 03/06/2017 0348   BILIRUBINUR NEGATIVE 03/06/2017 0348   BILIRUBINUR Negative 09/02/2014 2034   KETONESUR NEGATIVE 03/06/2017 0348   PROTEINUR 30 (A) 03/06/2017 0348   UROBILINOGEN 0.2 05/17/2014 1448   NITRITE NEGATIVE 03/06/2017 0348   LEUKOCYTESUR NEGATIVE 03/06/2017 0348    LEUKOCYTESUR Negative 09/02/2014 2034    Radiological Exams on Admission: DG Chest Port 1 View  Result Date: 07/12/2019 CLINICAL DATA:  Shortness of breath EXAM: PORTABLE CHEST 1 VIEW COMPARISON:  November 05, 2018 FINDINGS: The lung volumes are low. The cardiac silhouette is enlarged. There are prominent interstitial lung markings bilaterally which is similar to prior study. There is elevation of the right hemidiaphragm. No pneumothorax. No large pleural effusion. No acute osseous abnormality. IMPRESSION: Low lung volumes.  No definite focal infiltrate. Electronically Signed   By: Katherine Mantlehristopher  Green M.D.   On: 07/12/2019 19:43    EKG: Independently reviewed.   Assessment/Plan Principal Problem:   Acute respiratory failure with hypoxia (HCC) --Continue supplemental oxygen --Treat acute bronchitis as outlined below   Essential hypertension, benign --Controlled.  Continue home meds and adjust as needed   Acute bronchitis --IV Levaquin as patient has reported penicillin allergy --scheduled and as needed bronchodilator treatments, --Supplemental oxygen, --Mucolytic's Elevated lactic acid --Patient does have elevated lactic acid which could be due to increased work of breathing may not quite meet sepsis criteria --IV fluids --Continue management above --Continue to monitor for parameters       Andris BaumannHazel V Devian Bartolomei MD [facility]   If 7PM-7AM, please contact night-coverage [email or web address] Password [email or web address]  07/13/2019, 5:52 AM

## 2019-07-13 NOTE — Progress Notes (Signed)
OT Cancellation Note  Patient Details Name: Chelsea Nolan MRN: [id number] DOB: 22-Sep-1980   Cancelled Treatment:    Reason Eval/Treat Not Completed: Other (comment)  OT Consult received and chart reviewed. Upon speaking with PT, Per primary RN, patient in process of discharging (AMA?), defers therapy evaluation at this time. Will continue to follow and initiate if patient is agreeable and remains at [facility].Gerrianne Scale, [place], OTR/L ascom [phone] 07/13/19, 11:56 AM

## 2019-07-13 NOTE — Progress Notes (Signed)
Discharged AMA. IV's DC'd and monitor dc'd

## 2019-07-13 NOTE — Progress Notes (Signed)
Pt. Voices concern over her 37 year old daughter and wants to leave AMA. She said she spoke with the MD this morning and he advised her to stay. I also educated the patient on the need for treatment and went over labs, and other orders. She said she would drink plenty of things with vitamin C and if she needed to she would come back. She agreed to take a breathing treatment.

## 2019-07-13 NOTE — TOC Initial Note (Signed)
Transition of Care [facility]) - Initial/Assessment Note    Patient Details  Name: Chelsea Nolan MRN: [id number] Date of Birth: Oct 19, 1980  [facility]) CM/SW Contact:    Trecia Rogers, LCSW Phone Number: 07/13/2019, 10:04 AM  Clinical Narrative:                  Patient came to the ED due to shortness of breath from home. Patient reports that she lives at home with her husband, daughter, and 74 year old adult nephew. Patient reports, "I am not usually sick". Patient reported that she does not have COPD. Patient reported not having any DMEs at home. Patient denied wanting any DMEs at home.   Patient is currently on oxygen now at the hospital. 2L air.   Patient's PCP is Lethea Killings, [place].  Patient's pharmacy [facility] [place]. Patient reports not having any concerns/issues obtaining her medications.  Patient requested to wanting to go home.   No other needs    Expected Discharge Plan: Home/Self Care Barriers to Discharge: No Barriers Identified   Patient Goals and CMS Choice Patient states their goals for this hospitalization and ongoing recovery are:: "i want to go home" CMS Medicare.gov Compare Post Acute Care list provided to:: Patient Choice offered to / list presented to : Patient  Expected Discharge Plan and Services Expected Discharge Plan: Home/Self Care In-house Referral: Clinical Social Work     Living arrangements for the past 2 months: Single Family Home                                      Prior Living Arrangements/Services Living arrangements for the past 2 months: Single Family Home Lives with:: Self, Spouse, Minor Children, Relatives(adult nephew) Patient language and need for interpreter reviewed:: Yes Do you feel safe going back to the place where you live?: Yes            Criminal Activity/Legal Involvement Pertinent to Current Situation/Hospitalization: No - Comment as needed  Activities of Daily Living      Permission  Sought/Granted                  Emotional Assessment Appearance:: Appears stated age Attitude/Demeanor/Rapport: Gracious, Engaged Affect (typically observed): Accepting, Adaptable Orientation: : Oriented to Self, Oriented to Place, Oriented to  Time, Oriented to Situation   Psych Involvement: No (comment)  Admission diagnosis:  Acute bronchitis [J20.9] Patient Active Problem List   Diagnosis Date Noted  . Acute respiratory failure with hypoxia ([place]) 07/12/2019  . Acute bronchitis 07/12/2019  . Sepsis ([place]) 07/12/2019  . Hyperlipidemia 03/18/2016  . Chest pain 02/05/2016  . Migraine with aura 01/16/2015  . Renal stones 05/17/2014  . PTSD (post-traumatic stress disorder) 04/02/2014  . Family history of ovarian cancer 11/15/2013  . Psoriatic arthritis ([place]) 01/26/2013  . Essential hypertension, benign 01/26/2013  . Obesity, unspecified 01/26/2013   PCP:  Midge Minium, PA Pharmacy:   [facility] [address] [place] [address] [place]) [place] [address] Phone: [phone] Fax: [phone]  [id number] [place], [place] [address] [address] Phone: [phone] Fax: [phone]     Social Determinants of Health (SDOH) Interventions    Readmission Risk Interventions Readmission Risk Prevention Plan 07/13/2019  Medication Screening Complete  Transportation Screening Complete  Some recent  data might be hidden

## 2019-07-13 NOTE — ED Notes (Signed)
Assisted pt to use bathroom.  SpO2 93% after ambulation on room air.  O2 decreased to 2L.  Will continue to monitor O2 sats.

## 2019-07-13 NOTE — Progress Notes (Signed)
CODE SEPSIS - PHARMACY COMMUNICATION  **Broad Spectrum Antibiotics should be administered within 1 hour of Sepsis diagnosis**  Time Code Sepsis Called/Page Received: 0420  Antibiotics Ordered: Levaquin, Zithromax and Rocephin previously on 12/9  Time of 1st antibiotic administration: 12/9 2036, Rocephin  Additional action taken by pharmacy: n/a   If necessary, Name of Provider/Nurse Contacted: n/a    Chelsea Nolan ,PharmD Clinical Pharmacist  07/13/2019  4:45 AM

## 2019-07-13 NOTE — Progress Notes (Signed)
PT Cancellation Note  Patient Details Name: Chelsea Nolan MRN: [id number] DOB: 1980/09/08   Cancelled Treatment:    Reason Eval/Treat Not Completed: (Consult received and chart reviewed.  Per primary RN, patient in process of discharging (AMA?), defers PT evaluation at this time.  Will continue to follow and initiate if patient is agreeable and remains in house.)   Elhadji Pecore H. Owens Shark, PT, DPT, NCS 07/13/19, 11:16 AM [phone]

## 2019-07-13 NOTE — Discharge Summary (Signed)
Physician Discharge Summary  Chelsea Nolan CXK:[id number] DOB: Jan 22, 1981 DOA: 07/12/2019  PCP: Midge Minium, PA  Admit date: 07/12/2019 Discharge date: 07/13/2019  Discharge disposition: Left against medical advice.   Recommendations for Outpatient Follow-Up:    Return to emergency room for worsening symptoms. Follow up with PCP   Discharge Diagnosis:   Principal Problem:   Acute respiratory failure with hypoxia ([place]) Active Problems:   Essential hypertension, benign   Acute bronchitis   Sepsis ([place])    Discharge Condition: Stable.     Hospital Course:   Chelsea Nolan is a 38 year old with history of rheumatoid arthritis, psoriatic arthritis, hypertension, tobacco abuse, who presented to the hospital with cough, wheezing, productive chest pain and shortness of breath.  She denies any history of asthma or COPD.  Reportedly, her oxygen saturation was in the upper 80s and she had to be placed on oxygen in the emergency room.  She had leukocytosis and her lactic acid level was slightly elevated.  Chest x-ray showed low lung volumes but there were no infiltrates or acute abnormality.  She was admitted to the hospital for possible acute bronchitis, although pneumonia could not be excluded.  She was treated with empiric IV antibiotics and steroids.  She felt a little better and decided that she wanted to be discharged home.  However, she was informed that she was not ready for discharge. She insisted on going home because she has a 36 year old daughter who is at home by herself.  Risks of leaving Chelsea Nolan were discussed with the patient and she verbalized understanding.  She said she would come back to the hospital if her condition worsened.      Discharge Exam:   Vitals:   07/13/19 0630 07/13/19 0644  BP: (!) 144/88 (!) 151/96  Pulse: 81 82  Resp: (!) 28 (!) 22  Temp:  (!) 97.5 F (36.4 C)  SpO2: 95% 96%   Vitals:   07/13/19 0530 07/13/19 0600 07/13/19  0630 07/13/19 0644  BP: 132/83 133/85 (!) 144/88 (!) 151/96  Pulse: 83 83 81 82  Resp: (!) 23 (!) 25 (!) 28 (!) 22  Temp:    (!) 97.5 F (36.4 C)  TempSrc:    Oral  SpO2: 94% 92% 95% 96%  Weight:      Height:         GEN: NAD SKIN: No rash EYES: EOMI ENT: MMM CV: RRR PULM: CTA B ABD: soft, ND, NT, +BS CNS: AAO x 3, non focal EXT: No edema or tenderness   The results of significant diagnostics from this hospitalization (including imaging, microbiology, ancillary and laboratory) are listed below for reference.     Procedures and Diagnostic Studies:   DG Chest Port 1 View  Result Date: 07/12/2019 CLINICAL DATA:  Shortness of breath EXAM: PORTABLE CHEST 1 VIEW COMPARISON:  November 05, 2018 FINDINGS: The lung volumes are low. The cardiac silhouette is enlarged. There are prominent interstitial lung markings bilaterally which is similar to prior study. There is elevation of the right hemidiaphragm. No pneumothorax. No large pleural effusion. No acute osseous abnormality. IMPRESSION: Low lung volumes.  No definite focal infiltrate. Electronically Signed   By: Constance Holster M.D.   On: 07/12/2019 19:43     Labs:   Basic Metabolic Panel: Recent Labs  Lab 07/12/19 1855  NA 138  K 3.5  CL 106  CO2 21*  GLUCOSE 236*  BUN 12  CREATININE 0.64  CALCIUM 8.9  GFR Estimated Creatinine Clearance: 100.8 mL/min (by C-G formula based on SCr of 0.64 mg/dL). Liver Function Tests: Recent Labs  Lab 07/12/19 1855  AST 29  ALT 35  ALKPHOS 106  BILITOT 0.4  PROT 7.7  ALBUMIN 3.6   No results for input(s): LIPASE, AMYLASE in the last 168 hours. No results for input(s): AMMONIA in the last 168 hours. Coagulation profile Recent Labs  Lab 07/13/19 0530  INR 0.9    CBC: Recent Labs  Lab 07/12/19 1855 07/13/19 0530  WBC 14.4* 15.9*  NEUTROABS 7.7 12.2*  HGB 15.1* 14.3  HCT 45.4 43.6  MCV 96.4 97.5  PLT 383 350   Cardiac Enzymes: No results for input(s): CKTOTAL,  CKMB, CKMBINDEX, TROPONINI in the last 168 hours. BNP: Invalid input(s): POCBNP CBG: No results for input(s): GLUCAP in the last 168 hours. D-Dimer No results for input(s): DDIMER in the last 72 hours. Hgb A1c No results for input(s): HGBA1C in the last 72 hours. Lipid Profile No results for input(s): CHOL, HDL, LDLCALC, TRIG, CHOLHDL, LDLDIRECT in the last 72 hours. Thyroid function studies No results for input(s): TSH, T4TOTAL, T3FREE, THYROIDAB in the last 72 hours.  Invalid input(s): FREET3 Anemia work up No results for input(s): VITAMINB12, FOLATE, FERRITIN, TIBC, IRON, RETICCTPCT in the last 72 hours. Microbiology Recent Results (from the past 240 hour(s))  SARS CORONAVIRUS 2 (TAT 6-24 HRS) Nasopharyngeal Nasopharyngeal Swab     Status: None   Collection Time: 07/12/19  7:24 PM   Specimen: Nasopharyngeal Swab  Result Value Ref Range Status   SARS Coronavirus 2 NEGATIVE NEGATIVE Final    Comment: (NOTE) SARS-CoV-2 target nucleic acids are NOT DETECTED. The SARS-CoV-2 RNA is generally detectable in upper and lower respiratory specimens during the acute phase of infection. Negative results do not preclude SARS-CoV-2 infection, do not rule out co-infections with other pathogens, and should not be used as the sole basis for treatment or other patient management decisions. Negative results must be combined with clinical observations, patient history, and epidemiological information. The expected result is Negative. Fact Sheet for Patients: HairSlick.no Fact Sheet for Healthcare Providers: quierodirigir.com This test is not yet approved or cleared by the [place] FDA and  has been authorized for detection and/or diagnosis of SARS-CoV-2 by FDA under an Emergency Use Authorization (EUA). This EUA will remain  in effect (meaning this test can be used) for the duration of the COVID-19 declaration under Section 56 4(b)(1)  of the Act, 21 U.S.C. section 360bbb-3(b)(1), unless the authorization is terminated or revoked sooner. Performed at [facility], [address], [place], [place] [address]   Respiratory Panel by RT PCR (Flu A&B, Covid) - Nasopharyngeal Swab     Status: None   Collection Time: 07/12/19 10:25 PM   Specimen: Nasopharyngeal Swab  Result Value Ref Range Status   SARS Coronavirus 2 by RT PCR NEGATIVE NEGATIVE Final    Comment: (NOTE) SARS-CoV-2 target nucleic acids are NOT DETECTED. The SARS-CoV-2 RNA is generally detectable in upper respiratoy specimens during the acute phase of infection. The lowest concentration of SARS-CoV-2 viral copies this assay can detect is 131 copies/mL. A negative result does not preclude SARS-Cov-2 infection and should not be used as the sole basis for treatment or other patient management decisions. A negative result may occur with  improper specimen collection/handling, submission of specimen other than nasopharyngeal swab, presence of viral mutation(s) within the areas targeted by this assay, and inadequate number of viral copies (<131 copies/mL). A  negative result must be combined with clinical observations, patient history, and epidemiological information. The expected result is Negative. Fact Sheet for Patients:  https://www.moore.com/ Fact Sheet for Healthcare Providers:  https://www.young.biz/ This test is not yet ap proved or cleared by the [place] FDA and  has been authorized for detection and/or diagnosis of SARS-CoV-2 by FDA under an Emergency Use Authorization (EUA). This EUA will remain  in effect (meaning this test can be used) for the duration of the COVID-19 declaration under Section 564(b)(1) of the Act, 21 U.S.C. section 360bbb-3(b)(1), unless the authorization is terminated or revoked sooner.    Influenza A by PCR NEGATIVE NEGATIVE Final   Influenza B by PCR NEGATIVE NEGATIVE Final     Comment: (NOTE) The Xpert Xpress SARS-CoV-2/FLU/RSV assay is intended as an aid in  the diagnosis of influenza from Nasopharyngeal swab specimens and  should not be used as a sole basis for treatment. Nasal washings and  aspirates are unacceptable for Xpert Xpress SARS-CoV-2/FLU/RSV  testing. Fact Sheet for Patients: https://www.moore.com/ Fact Sheet for Healthcare Providers: https://www.young.biz/ This test is not yet approved or cleared by the [place] FDA and  has been authorized for detection and/or diagnosis of SARS-CoV-2 by  FDA under an Emergency Use Authorization (EUA). This EUA will remain  in effect (meaning this test can be used) for the duration of the  Covid-19 declaration under Section 564(b)(1) of the Act, 21  U.S.C. section 360bbb-3(b)(1), unless the authorization is  terminated or revoked. Performed at [facility], [address] [place], [place] [address]      Discharge Instructions:    Allergies as of 07/13/2019      Reactions   Penicillins Rash   Enbrel [etanercept] Other (See Comments)   Reaction:  Abdominal pain   Flexeril [cyclobenzaprine]    Prednisone Nausea Only   Sertraline Other (See Comments)   Reaction:  Suicidal ideation   Topamax [topiramate] Other (See Comments)   Reaction:  Tingling    Amoxicillin Rash   Zofran [ondansetron Hcl] Rash, Other (See Comments)   Reaction:  Headache             Time coordinating discharge:  25 minutes  Signed:  Kewanna Kasprzak  [facility] 07/13/2019, 11:15 AM

## 2019-07-18 LAB — CULTURE, BLOOD (ROUTINE X 2)
Culture: NO GROWTH
Culture: NO GROWTH
Special Requests: ADEQUATE

## 2019-10-14 ENCOUNTER — Other Ambulatory Visit: Payer: Self-pay

## 2019-10-14 ENCOUNTER — Emergency Department: Payer: Medicare Other

## 2019-10-14 DIAGNOSIS — Z79899 Other long term (current) drug therapy: Secondary | ICD-10-CM | POA: Insufficient documentation

## 2019-10-14 DIAGNOSIS — M25552 Pain in left hip: Secondary | ICD-10-CM | POA: Insufficient documentation

## 2019-10-14 DIAGNOSIS — Z87891 Personal history of nicotine dependence: Secondary | ICD-10-CM | POA: Diagnosis not present

## 2019-10-14 DIAGNOSIS — I10 Essential (primary) hypertension: Secondary | ICD-10-CM | POA: Diagnosis not present

## 2019-10-14 NOTE — ED Triage Notes (Signed)
Pt reports waking up this AM with Left hip pain that required assistance with ambulation. Pt states she has used a heating pad, OTC tylenol, and Biofreeze with no pain relief. Pt states this occurred with the right hip last week and was dx with osteoarthritis. Pt states she is in severe pain in triage, mask in place, RR even and unlabored.

## 2019-10-15 ENCOUNTER — Emergency Department
Admission: EM | Admit: 2019-10-15 | Discharge: 2019-10-15 | Disposition: A | Discharge status: Home / Self Care | Payer: Medicare Other | Attending: Emergency Medicine | Admitting: Emergency Medicine

## 2019-10-15 DIAGNOSIS — M25552 Pain in left hip: Secondary | ICD-10-CM | POA: Diagnosis not present

## 2019-10-15 MED ORDER — KETOROLAC TROMETHAMINE 30 MG/ML IJ SOLN
30.0000 mg | Freq: Once | INTRAMUSCULAR | Status: AC
  Administered 2019-10-15: 30 mg via INTRAMUSCULAR
  Filled 2019-10-15: qty 1

## 2019-10-15 MED ORDER — LIDOCAINE 5 % EX PTCH
1.0000 | MEDICATED_PATCH | CUTANEOUS | Status: DC
  Administered 2019-10-15: 1 via TRANSDERMAL
  Filled 2019-10-15: qty 1

## 2019-10-15 MED ORDER — LIDOCAINE 5 % EX PTCH: 1.0000 | MEDICATED_PATCH | Freq: Two times a day (BID) | CUTANEOUS | 0 refills | Status: AC

## 2019-10-15 NOTE — ED Provider Notes (Signed)
[facility] Emergency Department Provider Note   ____________________________________________   First MD Initiated Contact with Patient 10/15/19 0113     (approximate)  I have reviewed the triage vital signs and the nursing notes.   HISTORY  Chief Complaint Hip Pain    HPI Chelsea Nolan is a 39 y.o. female with past medical history of psoriatic arthritis who presents to the ED complaining of hip pain.  Patient reports that she woke up earlier this morning with acute pain in her left hip.  She describes pain whenever she goes to move her left leg and it seems to shoot down towards her foot.  She denies any recent falls or other trauma to her hip area.  She has not had any numbness or weakness in her lower extremities and denies any difficulty going to the bathroom.  She has not had any dysuria, hematuria, or flank pain.        Past Medical History:  Diagnosis Date  . Arthritis   . Kidney stones   . Obesity   . Psoriatic arthritis [facility])    Dermatologist - Dr. Adolphus Birchwood, Rheumatologist - Dr. Gavin Potters  . Rheumatoid arthritis [facility])     Patient Active Problem List   Diagnosis Date Noted  . Acute respiratory failure with hypoxia (H[facility]) 07/12/2019  . Acute bronchitis 07/12/2019  . Sepsis (H[facility]) 07/12/2019  . Hyperlipidemia 03/18/2016  . Chest pain 02/05/2016  . Migraine with aura 01/16/2015  . Renal stones 05/17/2014  . PTSD (post-traumatic stress disorder) 04/02/2014  . Family history of ovarian cancer 11/15/2013  . Psoriatic arthritis (H[facility]) 01/26/2013  . Essential hypertension, benign 01/26/2013  . Obesity, unspecified 01/26/2013    Past Surgical History:  Procedure Laterality Date  . ABDOMINAL HYSTERECTOMY    . CESAREAN SECTION     pre-eclampsia  . LITHOTRIPSY    . VAGINAL DELIVERY      Prior to Admission medications   Medication Sig Start Date End Date Taking? Authorizing Provider  alfuzosin (UROXATRAL) 10 MG 24 hr tablet Take 10 mg by mouth  daily. 07/05/19   [provider]  amphetamine-dextroamphetamine (ADDERALL) 30 MG tablet Take 1 tablet by mouth 2 (two) times daily. 06/20/19   [provider]  diazepam (VALIUM) 10 MG tablet Take 10 mg by mouth 3 (three) times daily.    [provider]  fluvoxaMINE (LUVOX) 25 MG tablet Take 25 mg by mouth 2 (two) times daily. 06/21/19   [provider]  gabapentin (NEURONTIN) 400 MG capsule Take 400 mg by mouth 4 (four) times daily. 06/16/19   [provider]  prazosin (MINIPRESS) 2 MG capsule Take 2-4 mg by mouth at bedtime.     [provider]  predniSONE (DELTASONE) 20 MG tablet Take 20 mg by mouth 3 (three) times daily.  07/10/19   [provider]  promethazine (PHENERGAN) 25 MG tablet Take 25 mg by mouth every 6 (six) hours as needed. 07/12/19   [provider]  VENTOLIN HFA 108 (90 Base) MCG/ACT inhaler Inhale 1-2 puffs into the lungs every 4 (four) hours as needed.  07/10/19   [provider]    Allergies Penicillins, Enbrel [etanercept], Flexeril [cyclobenzaprine], Prednisone, Sertraline, Topamax [topiramate], Amoxicillin, and Zofran [ondansetron hcl]  Family History  Problem Relation Age of Onset  . Diabetes Mother   . Hyperlipidemia Mother   . Hypertension Mother   . Arthritis Mother   . Lupus Mother   . Obesity Sister   . Cancer  Maternal Grandmother        ovarian    Social History Social History   Tobacco Use  . Smoking status: Former Smoker    Years: 15.00    Types: Cigarettes    Quit date: 06/16/2019    Years since quitting: 0.3  . Smokeless tobacco: Never Used  Substance Use Topics  . Alcohol use: No  . Drug use: No    Review of Systems  Constitutional: No fever/chills Eyes: No visual changes. ENT: No sore throat. Cardiovascular: Denies chest pain. Respiratory: Denies shortness of breath. Gastrointestinal: No abdominal pain.  No nausea, no vomiting.  No diarrhea.  No  constipation. Genitourinary: Negative for dysuria. Musculoskeletal: Negative for back pain.  Positive for left hip pain. Skin: Negative for rash. Neurological: Negative for headaches, focal weakness or numbness.  ____________________________________________   PHYSICAL EXAM:  VITAL SIGNS: ED Triage Vitals  Enc Vitals Group     BP 10/14/19 2212 114/89     Pulse Rate 10/14/19 2212 88     Resp 10/14/19 2212 20     Temp 10/14/19 2212 98.1 F (36.7 C)     Temp Source 10/14/19 2212 Oral     SpO2 10/14/19 2212 98 %     Weight 10/14/19 2213 220 lb (99.8 kg)     Height 10/14/19 2213 5\' 2"  (1.575 m)     Head Circumference --      Peak Flow --      Pain Score 10/14/19 2212 9     Pain Loc --      Pain Edu? --      Excl. in GC? --     Constitutional: Alert and oriented. Eyes: Conjunctivae are normal. Head: Atraumatic. Nose: No congestion/rhinnorhea. Mouth/Throat: Mucous membranes are moist. Neck: Normal ROM Cardiovascular: Normal rate, regular rhythm. Grossly normal heart sounds.  2+ DP pulses bilaterally. Respiratory: Normal respiratory effort.  No retractions. Lungs CTAB. Gastrointestinal: Soft and nontender. No distention. Genitourinary: deferred Musculoskeletal: No lower extremity tenderness nor edema.  Full range of motion at left hip and left knee with mild discomfort.  No erythema or warmth in area of left hip. Neurologic:  Normal speech and language. No gross focal neurologic deficits are appreciated. Skin:  Skin is warm, dry and intact. No rash noted. Psychiatric: Mood and affect are normal. Speech and behavior are normal.  ____________________________________________   LABS (all labs ordered are listed, but only abnormal results are displayed)  Labs Reviewed - No data to display   PROCEDURES  Procedure(s) performed (including Critical Care):  Procedures   ____________________________________________   INITIAL IMPRESSION / ASSESSMENT AND PLAN / ED COURSE        39 year old female with history of psoriatic arthritis presents to the ED with acute onset left hip pain since waking up this morning with no recent trauma.  X-rays are negative for acute process and on exam there is no evidence of septic arthritis.  She is neurovascularly intact to her bilateral lower extremities.  We will plan on conservative management with nonnarcotic pain medications and close follow-up with patient's rheumatologist.  She was counseled to return to the ED for new worsening symptoms, patient agrees with plan.      ____________________________________________   FINAL CLINICAL IMPRESSION(S) / ED DIAGNOSES  Final diagnoses:  Left hip pain     ED Discharge Orders    None       Note:  This document was prepared using Dragon voice recognition software and may include unintentional dictation  errors.   Blake Divine, MD 10/15/19 [phone]

## 2019-10-23 ENCOUNTER — Other Ambulatory Visit (HOSPITAL_COMMUNITY): Payer: Self-pay | Admitting: Surgery

## 2019-10-23 ENCOUNTER — Other Ambulatory Visit: Payer: Self-pay | Admitting: Surgery

## 2019-11-08 ENCOUNTER — Ambulatory Visit (HOSPITAL_COMMUNITY)
Admission: RE | Admit: 2019-11-08 | Discharge: 2019-11-08 | Disposition: A | Discharge status: Home / Self Care | Payer: Medicare Other | Source: Ambulatory Visit | Attending: Surgery | Admitting: Surgery

## 2019-11-08 ENCOUNTER — Other Ambulatory Visit: Payer: Self-pay

## 2019-11-21 ENCOUNTER — Other Ambulatory Visit: Payer: Self-pay

## 2019-11-21 ENCOUNTER — Encounter: Payer: Self-pay | Admitting: Dietician

## 2019-11-21 ENCOUNTER — Encounter: Payer: Medicare Other | Attending: Surgery | Admitting: Dietician

## 2019-11-21 DIAGNOSIS — I10 Essential (primary) hypertension: Secondary | ICD-10-CM | POA: Diagnosis not present

## 2019-11-21 DIAGNOSIS — Z880 Allergy status to penicillin: Secondary | ICD-10-CM | POA: Diagnosis not present

## 2019-11-21 DIAGNOSIS — Z87891 Personal history of nicotine dependence: Secondary | ICD-10-CM | POA: Diagnosis not present

## 2019-11-21 DIAGNOSIS — Z888 Allergy status to other drugs, medicaments and biological substances status: Secondary | ICD-10-CM | POA: Diagnosis not present

## 2019-11-21 DIAGNOSIS — Z8249 Family history of ischemic heart disease and other diseases of the circulatory system: Secondary | ICD-10-CM | POA: Insufficient documentation

## 2019-11-21 DIAGNOSIS — Z7984 Long term (current) use of oral hypoglycemic drugs: Secondary | ICD-10-CM | POA: Diagnosis not present

## 2019-11-21 DIAGNOSIS — Z836 Family history of other diseases of the respiratory system: Secondary | ICD-10-CM | POA: Insufficient documentation

## 2019-11-21 DIAGNOSIS — Z6841 Body Mass Index (BMI) 40.0 and over, adult: Secondary | ICD-10-CM | POA: Diagnosis not present

## 2019-11-21 DIAGNOSIS — F419 Anxiety disorder, unspecified: Secondary | ICD-10-CM | POA: Diagnosis not present

## 2019-11-21 DIAGNOSIS — Z79899 Other long term (current) drug therapy: Secondary | ICD-10-CM | POA: Insufficient documentation

## 2019-11-21 DIAGNOSIS — Z91040 Latex allergy status: Secondary | ICD-10-CM | POA: Insufficient documentation

## 2019-11-21 DIAGNOSIS — R7303 Prediabetes: Secondary | ICD-10-CM | POA: Diagnosis not present

## 2019-11-21 DIAGNOSIS — Z886 Allergy status to analgesic agent status: Secondary | ICD-10-CM | POA: Diagnosis not present

## 2019-11-21 DIAGNOSIS — Z713 Dietary counseling and surveillance: Secondary | ICD-10-CM | POA: Diagnosis present

## 2019-11-21 DIAGNOSIS — K219 Gastro-esophageal reflux disease without esophagitis: Secondary | ICD-10-CM | POA: Insufficient documentation

## 2019-11-21 DIAGNOSIS — Z833 Family history of diabetes mellitus: Secondary | ICD-10-CM | POA: Diagnosis not present

## 2019-11-21 DIAGNOSIS — Z87442 Personal history of urinary calculi: Secondary | ICD-10-CM | POA: Insufficient documentation

## 2019-11-21 DIAGNOSIS — M1990 Unspecified osteoarthritis, unspecified site: Secondary | ICD-10-CM | POA: Insufficient documentation

## 2019-11-21 DIAGNOSIS — E669 Obesity, unspecified: Secondary | ICD-10-CM

## 2019-11-21 NOTE — Progress Notes (Signed)
Nutrition Assessment for Bariatric Surgery Medical Nutrition Therapy  Appt Start Time: 9:45am     End Time: 10:40am  Patient was seen on 11/21/2019 for Pre-Operative Nutrition Assessment. Letter of approval faxed to [facility] bariatric surgery program coordinator on 11/21/2019.   Referral stated Supervised Weight Loss (SWL) visits needed: 6* *completing these with PCP, began in February  Planned surgery: Sleeve Gastrectomy Pt expectation of surgery: to be able to keep up with kids and pets, to be healthier, to live longer   NUTRITION ASSESSMENT   Anthropometrics  Start weight at [facility]: 219 lbs (date: 11/21/2019) Height: 62 in BMI: 40 kg/m2     Lifestyle & Dietary Hx Typical meal pattern is 3 meals per day. Follows keto diet. Will do grilled chicken or fish for meats, no fried foods. May have salad with meals. Drinks protein shake daily for breakfast, otherwise drinks lots of water.   24-Hr Dietary Recall First Meal: protein shake + banana  Snack: - Second Meal: vegetables + nuts (or cheese)  Snack: - Third Meal: chicken + vegetables  Snack: - Beverages: water    NUTRITION DIAGNOSIS  Overweight/obesity ([place]-3.3) related to past poor dietary habits and physical inactivity as evidenced by patient w/ planned Sleeve Gastrectomy surgery following dietary guidelines for continued weight loss.    NUTRITION INTERVENTION  Nutrition counseling (C-1) and education (E-2) to facilitate bariatric surgery goals.  Pre-Op Goals Reviewed with the Patient . Track food and beverage intake (pen and paper, MyFitness Pal, Baritastic app, etc.) . Make healthy food choices while monitoring portion sizes . Consume 3 meals per day or try to eat every 3-5 hours . Avoid concentrated sugars and fried foods . Keep sugar & fat in the single digits per serving on food labels . Practice CHEWING your food (aim for applesauce consistency) . Practice not drinking 15 minutes before, during, and 30  minutes after each meal and snack . Avoid all carbonated beverages (ex: soda, sparkling beverages)  . Limit caffeinated beverages (ex: coffee, tea, energy drinks) . Avoid all sugar-sweetened beverages (ex: regular soda, sports drinks)  . Avoid alcohol  . Aim for 64-100 ounces of FLUID daily (with at least half of fluid intake being plain water)  . Aim for at least 60-80 grams of PROTEIN daily . Look for a liquid protein source that contains ?15 g protein and ?5 g carbohydrate (ex: shakes, drinks, shots) . Make a list of non-food related activities . Physical activity is an important part of a healthy lifestyle so keep it moving! The goal is to reach 150 minutes of exercise per week, including cardiovascular and weight baring activity.   Handouts Provided Include  . Bariatric Surgery handouts (Nutrition Visits, Pre-Op Goals, Protein Shakes, Vitamins & Minerals)  Learning Style & Readiness for Change Teaching method utilized: Visual & Auditory  Demonstrated degree of understanding via: Teach Back  Barriers to learning/adherence to lifestyle change: None Identified    MONITORING & EVALUATION Dietary intake, weekly physical activity, body weight, and pre-op goals reached at next nutrition visit.   Next Steps Patient is to return to [facility] for Pre-Op Class (>2 weeks before surgery) once remaining supervised weight loss visits are completed with PCP.

## 2019-11-21 NOTE — Patient Instructions (Signed)
Consider taking a daily multivitamin up until surgery when you will switch to a bariatric multivitamin and calcium.   Begin working through the [employer] discussed today, starting with what you think may be most "difficult."

## 2020-01-15 ENCOUNTER — Ambulatory Visit (INDEPENDENT_AMBULATORY_CARE_PROVIDER_SITE_OTHER): Payer: Medicare Other | Admitting: Psychology

## 2020-01-15 DIAGNOSIS — F509 Eating disorder, unspecified: Secondary | ICD-10-CM | POA: Diagnosis not present

## 2020-02-12 ENCOUNTER — Ambulatory Visit: Payer: Medicare Other | Admitting: Psychology

## 2020-02-13 ENCOUNTER — Ambulatory Visit (INDEPENDENT_AMBULATORY_CARE_PROVIDER_SITE_OTHER): Payer: Medicare Other | Admitting: Psychology

## 2020-02-26 ENCOUNTER — Encounter: Payer: Medicare Other | Attending: Surgery | Admitting: Skilled Nursing Facility1

## 2020-02-26 ENCOUNTER — Other Ambulatory Visit: Payer: Self-pay

## 2020-02-26 DIAGNOSIS — E669 Obesity, unspecified: Secondary | ICD-10-CM | POA: Diagnosis present

## 2020-02-26 NOTE — Progress Notes (Signed)
Pre-Operative Nutrition Class:  Appt start time: 8588   End time:  1830.  Patient was seen on 02/26/2020 for Pre-Operative Bariatric Surgery Education at [facility].    Surgery date:  Surgery type: sleeve Start weight at [facility]: 219 Weight today: 209  Samples given per MNT protocol. Patient educated on appropriate usage: Bariatric Advantage Multivitamin Lot #[id number] Exp:06/22  Celebrate Vitamins Calcium  Lot # [id number] Exp: 08/22   Protein20 Shake Lot #[id number] Exp: 01/29/2021   The following the learning objectives were met by the patient during this course:  Identify Pre-Op Dietary Goals and will begin 2 weeks pre-operatively  Identify appropriate sources of fluids and proteins   State protein recommendations and appropriate sources pre and post-operatively  Identify Post-Operative Dietary Goals and will follow for 2 weeks post-operatively  Identify appropriate multivitamin and calcium sources  Describe the need for physical activity post-operatively and will follow MD recommendations  State when to call healthcare provider regarding medication questions or post-operative complications  Handouts given during class include:  Pre-Op Bariatric Surgery Diet Handout  Protein Shake Handout  Post-Op Bariatric Surgery Nutrition Handout  [facility] Program Information Flyer  Support Group Information Flyer  [facility] Bariatric Supplements Price List  Follow-Up Plan: Patient will follow-up at [facility] 2 weeks post operatively for diet advancement per MD.

## 2020-07-28 DIAGNOSIS — I214 Non-ST elevation (NSTEMI) myocardial infarction: Secondary | ICD-10-CM | POA: Insufficient documentation

## 2020-08-06 DIAGNOSIS — I2510 Atherosclerotic heart disease of native coronary artery without angina pectoris: Secondary | ICD-10-CM | POA: Diagnosis present

## 2020-09-13 ENCOUNTER — Other Ambulatory Visit: Payer: Self-pay

## 2020-09-13 ENCOUNTER — Encounter: Payer: Medicare Other | Attending: Cardiology | Admitting: *Deleted

## 2020-09-13 DIAGNOSIS — Z955 Presence of coronary angioplasty implant and graft: Secondary | ICD-10-CM | POA: Insufficient documentation

## 2020-09-13 DIAGNOSIS — I214 Non-ST elevation (NSTEMI) myocardial infarction: Secondary | ICD-10-CM | POA: Insufficient documentation

## 2020-09-13 NOTE — Progress Notes (Signed)
Initial telephone orientation completed. Documentation can be found in [facility] 12/25. EP orientation scheduled for Monday 2/14 at 9am.

## 2020-09-16 ENCOUNTER — Other Ambulatory Visit: Payer: Self-pay

## 2020-09-16 ENCOUNTER — Encounter: Payer: Medicare Other | Admitting: *Deleted

## 2020-09-16 VITALS — Ht 62.75 in | Wt 211.5 lb

## 2020-09-16 DIAGNOSIS — Z955 Presence of coronary angioplasty implant and graft: Secondary | ICD-10-CM

## 2020-09-16 DIAGNOSIS — I214 Non-ST elevation (NSTEMI) myocardial infarction: Secondary | ICD-10-CM

## 2020-09-16 NOTE — Progress Notes (Signed)
Chelsea Nolan has recently quit tobacco use within the last 6 months. Intervention for relapse prevention was provided at the initial medical review. He was encouraged to continue to with tobacco cessation and was provided information on relapse prevention. Patient received information about combination therapy, tobacco cessation classes, quit line, and quit smoking apps in case of a relapse. Patient demonstrated understanding of this material.Staff will continue to provide encouragement and follow up with the patient throughout the program.

## 2020-09-16 NOTE — Progress Notes (Signed)
Cardiac Individual Treatment Plan  Patient Details  Name: WOODIE MASHAW MRN: [id number] Date of Birth: 12-07-1980 Referring Provider:   Flowsheet Row Cardiac Rehab from 09/16/2020 in [facility] Cardiac and Pulmonary Rehab  Referring Provider Christella Noa MD      Initial Encounter Date:  Flowsheet Row Cardiac Rehab from 09/16/2020 in [facility] Cardiac and Pulmonary Rehab  Date 09/16/20      Visit Diagnosis: NSTEMI (non-ST elevated myocardial infarction) [facility])  Status post coronary artery stent placement  Patient's Home Medications on Admission:  Current Outpatient Medications:  .  alfuzosin (UROXATRAL) 10 MG 24 hr tablet, Take 10 mg by mouth daily., Disp: , Rfl:  .  amphetamine-dextroamphetamine (ADDERALL) 30 MG tablet, Take 1 tablet by mouth 2 (two) times daily. (Patient not taking: Reported on 09/13/2020), Disp: , Rfl:  .  aspirin 81 MG chewable tablet, Chew by mouth., Disp: , Rfl:  .  atorvastatin (LIPITOR) 80 MG tablet, Take by mouth., Disp: , Rfl:  .  diazepam (VALIUM) 10 MG tablet, Take 10 mg by mouth 3 (three) times daily., Disp: , Rfl:  .  fluvoxaMINE (LUVOX) 25 MG tablet, Take 25 mg by mouth 2 (two) times daily., Disp: , Rfl:  .  folic acid (FOLVITE) 1 MG tablet, Take 2 tablets by mouth daily. (Patient not taking: Reported on 09/13/2020), Disp: , Rfl:  .  gabapentin (NEURONTIN) 400 MG capsule, Take 400 mg by mouth 4 (four) times daily., Disp: , Rfl:  .  lidocaine (LIDODERM) 5 %, Place 1 patch onto the skin every 12 (twelve) hours. Remove & Discard patch within 12 hours or as directed by MD, Disp: 10 patch, Rfl: 0 .  methotrexate 250 MG/10ML injection, Inject into the skin., Disp: , Rfl:  .  metoprolol succinate (TOPROL-XL) 50 MG 24 hr tablet, Take by mouth., Disp: , Rfl:  .  nicotine (NICODERM CQ - DOSED IN MG/24 HOURS) 21 mg/24hr patch, Place onto the skin., Disp: , Rfl:  .  nitroGLYCERIN (NITROSTAT) 0.4 MG SL tablet, Place under the tongue., Disp: , Rfl:  .  olmesartan (BENICAR) 40 MG  tablet, Take by mouth., Disp: , Rfl:  .  oxyCODONE (OXY IR/ROXICODONE) 5 MG immediate release tablet, TAKE 1 TABLET (5 MG TOTAL) BY MOUTH EVERY FOUR (4) HOURS AS NEEDED FOR PAIN FOR UP TO 5 DAYS., Disp: , Rfl:  .  prazosin (MINIPRESS) 2 MG capsule, Take 2-4 mg by mouth at bedtime. , Disp: , Rfl:  .  predniSONE (DELTASONE) 20 MG tablet, Take 20 mg by mouth 3 (three) times daily. , Disp: , Rfl:  .  promethazine (PHENERGAN) 25 MG tablet, Take 25 mg by mouth every 6 (six) hours as needed. (Patient not taking: Reported on 09/13/2020), Disp: , Rfl:  .  ticagrelor (BRILINTA) 90 MG TABS tablet, Take by mouth., Disp: , Rfl:  .  triamcinolone ointment (KENALOG) 0.1 %, APPLY TO AFFECTED AREA TWICE A DAY, Disp: , Rfl:  .  VENTOLIN HFA 108 (90 Base) MCG/ACT inhaler, Inhale 1-2 puffs into the lungs every 4 (four) hours as needed. , Disp: , Rfl:   Past Medical History: Past Medical History:  Diagnosis Date  . Anxiety   . Arthritis   . Depression   . GERD (gastroesophageal reflux disease)   . Hypertension   . Kidney stones   . Migraine headache   . Obesity   . Psoriatic arthritis [facility])    Dermatologist - Dr. Adolphus Birchwood, Rheumatologist - Dr. Gavin Potters  . Rheumatoid arthritis (HCC)  Tobacco Use: Social History   Tobacco Use  Smoking Status Former Smoker  . Years: 15.00  . Types: Cigarettes  . Quit date: 07/27/2020  . Years since quitting: 0.1  Smokeless Tobacco Never Used    Labs: Recent Review Flowsheet Data   There is no flowsheet data to display.      Exercise Target Goals: Exercise Program Goal: Individual exercise prescription set using results from initial 6 min walk test and THRR while considering  patient's activity barriers and safety.   Exercise Prescription Goal: Initial exercise prescription builds to 30-45 minutes a day of aerobic activity, 2-3 days per week.  Home exercise guidelines will be given to patient during program as part of exercise prescription that the  participant will acknowledge.   Education: Aerobic Exercise: - Group verbal and visual presentation on the components of exercise prescription. Introduces F.I.T.T principle from ACSM for exercise prescriptions.  Reviews F.I.T.T. principles of aerobic exercise including progression. Written material given at graduation. Flowsheet Row Cardiac Rehab from 09/16/2020 in [facility] Cardiac and Pulmonary Rehab  Date 09/16/20  Educator [facility]  Instruction Review Code 1- Verbalizes Understanding      Education: Resistance Exercise: - Group verbal and visual presentation on the components of exercise prescription. Introduces F.I.T.T principle from ACSM for exercise prescriptions  Reviews F.I.T.T. principles of resistance exercise including progression. Written material given at graduation.    Education: Exercise & Equipment Safety: - Individual verbal instruction and demonstration of equipment use and safety with use of the equipment. Flowsheet Row Cardiac Rehab from 09/16/2020 in [facility] Cardiac and Pulmonary Rehab  Date 09/16/20  Educator [facility]  Instruction Review Code 1- Verbalizes Understanding      Education: Exercise Physiology & General Exercise Guidelines: - Group verbal and written instruction with models to review the exercise physiology of the cardiovascular system and associated critical values. Provides general exercise guidelines with specific guidelines to those with heart or lung disease.    Education: Flexibility, Balance, Mind/Body Relaxation: - Group verbal and visual presentation with interactive activity on the components of exercise prescription. Introduces F.I.T.T principle from ACSM for exercise prescriptions. Reviews F.I.T.T. principles of flexibility and balance exercise training including progression. Also discusses the mind body connection.  Reviews various relaxation techniques to help reduce and manage stress (i.e. Deep breathing, progressive muscle relaxation, and visualization).  Balance handout provided to take home. Written material given at graduation.   Activity Barriers & Risk Stratification:  Activity Barriers & Cardiac Risk Stratification - 09/16/20 1456      Activity Barriers & Cardiac Risk Stratification   Activity Barriers Arthritis;Joint Problems;Back Problems;Deconditioning;Muscular Weakness;Shortness of Breath    Cardiac Risk Stratification High           6 Minute Walk:  6 Minute Walk    Row Name 09/16/20 1455         6 Minute Walk   Phase Initial     Distance 1385 feet     Walk Time 6 minutes     # of Rest Breaks 0     MPH 2.62     METS 4.54     RPE 7     Perceived Dyspnea  1     VO2 Peak 15.9     Symptoms Yes (comment)     Comments SOB     Resting HR 82 bpm     Resting BP 124/72     Resting Oxygen Saturation  95 %     Exercise Oxygen Saturation  during 6  min walk 97 %     Max Ex. HR 112 bpm     Max Ex. BP 166/64     2 Minute Post BP 110/60            Oxygen Initial Assessment:   Oxygen Re-Evaluation:   Oxygen Discharge (Final Oxygen Re-Evaluation):   Initial Exercise Prescription:  Initial Exercise Prescription - 09/16/20 1400      Date of Initial Exercise RX and Referring Provider   Date 09/16/20    Referring Provider Christella Noa MD      Treadmill   MPH 2.8    Grade 2    Minutes 15    METs 3.81      NuStep   Level 3    SPM 80    Minutes 15    METs 3      REL-XR   Level 3    Speed 50    Minutes 15    METs 3      Prescription Details   Frequency (times per week) 3    Duration Progress to 30 minutes of continuous aerobic without signs/symptoms of physical distress      Intensity   THRR 40-80% of Max Heartrate 122-161    Ratings of Perceived Exertion 11-13    Perceived Dyspnea 0-4      Progression   Progression Continue to progress workloads to maintain intensity without signs/symptoms of physical distress.      Resistance Training   Training Prescription Yes    Weight 4 lb    Reps  10-15           Perform Capillary Blood Glucose checks as needed.  Exercise Prescription Changes:  Exercise Prescription Changes    Row Name 09/16/20 1400             Response to Exercise   Blood Pressure (Admit) 124/72       Blood Pressure (Exercise) 166/64       Blood Pressure (Exit) 110/60       Heart Rate (Admit) 82 bpm       Heart Rate (Exercise) 112 bpm       Heart Rate (Exit) 87 bpm       Oxygen Saturation (Admit) 95 %       Oxygen Saturation (Exercise) 97 %       Rating of Perceived Exertion (Exercise) 7       Perceived Dyspnea (Exercise) 1       Symptoms SOB       Comments walk test results              Exercise Comments:   Exercise Goals and Review:  Exercise Goals    Row Name 09/16/20 1500             Exercise Goals   Increase Physical Activity Yes       Intervention Provide advice, education, support and counseling about physical activity/exercise needs.;Develop an individualized exercise prescription for aerobic and resistive training based on initial evaluation findings, risk stratification, comorbidities and participant's personal goals.       Expected Outcomes Short Term: Attend rehab on a regular basis to increase amount of physical activity.;Long Term: Add in home exercise to make exercise part of routine and to increase amount of physical activity.;Long Term: Exercising regularly at least 3-5 days a week.       Increase Strength and Stamina Yes       Intervention Provide advice, education, support and counseling  about physical activity/exercise needs.;Develop an individualized exercise prescription for aerobic and resistive training based on initial evaluation findings, risk stratification, comorbidities and participant's personal goals.       Expected Outcomes Short Term: Increase workloads from initial exercise prescription for resistance, speed, and METs.;Short Term: Perform resistance training exercises routinely during rehab and add in  resistance training at home;Long Term: Improve cardiorespiratory fitness, muscular endurance and strength as measured by increased METs and functional capacity ( )       Able to understand and use rate of perceived exertion (RPE) scale Yes       Intervention Provide education and explanation on how to use RPE scale       Expected Outcomes Short Term: Able to use RPE daily in rehab to express subjective intensity level;Long Term:  Able to use RPE to guide intensity level when exercising independently       Able to understand and use Dyspnea scale Yes       Intervention Provide education and explanation on how to use Dyspnea scale       Expected Outcomes Short Term: Able to use Dyspnea scale daily in rehab to express subjective sense of shortness of breath during exertion;Long Term: Able to use Dyspnea scale to guide intensity level when exercising independently       Knowledge and understanding of Target Heart Rate Range (THRR) Yes       Intervention Provide education and explanation of THRR including how the numbers were predicted and where they are located for reference       Expected Outcomes Short Term: Able to state/look up THRR;Short Term: Able to use daily as guideline for intensity in rehab;Long Term: Able to use THRR to govern intensity when exercising independently       Able to check pulse independently Yes       Intervention Provide education and demonstration on how to check pulse in carotid and radial arteries.;Review the importance of being able to check your own pulse for safety during independent exercise       Expected Outcomes Short Term: Able to explain why pulse checking is important during independent exercise;Long Term: Able to check pulse independently and accurately       Understanding of Exercise Prescription Yes       Intervention Provide education, explanation, and written materials on patient's individual exercise prescription       Expected Outcomes Short Term: Able to  explain program exercise prescription;Long Term: Able to explain home exercise prescription to exercise independently              Exercise Goals Re-Evaluation :   Discharge Exercise Prescription (Final Exercise Prescription Changes):  Exercise Prescription Changes - 09/16/20 1400      Response to Exercise   Blood Pressure (Admit) 124/72    Blood Pressure (Exercise) 166/64    Blood Pressure (Exit) 110/60    Heart Rate (Admit) 82 bpm    Heart Rate (Exercise) 112 bpm    Heart Rate (Exit) 87 bpm    Oxygen Saturation (Admit) 95 %    Oxygen Saturation (Exercise) 97 %    Rating of Perceived Exertion (Exercise) 7    Perceived Dyspnea (Exercise) 1    Symptoms SOB    Comments walk test results           Nutrition:  Target Goals: Understanding of nutrition guidelines, daily intake of sodium 1500mg , cholesterol 200mg , calories 30% from fat and 7% or less from saturated fats, daily to  have 5 or more servings of fruits and vegetables.  Education: All About Nutrition: -Group instruction provided by verbal, written material, interactive activities, discussions, models, and posters to present general guidelines for heart healthy nutrition including fat, fiber, MyPlate, the role of sodium in heart healthy nutrition, utilization of the nutrition label, and utilization of this knowledge for meal planning. Follow up email sent as well. Written material given at graduation. Flowsheet Row Cardiac Rehab from 09/16/2020 in [facility] Cardiac and Pulmonary Rehab  Education need identified 09/16/20      Biometrics:  Pre Biometrics - 09/16/20 1501      Pre Biometrics   Height 5' 2.75" (1.594 m)    Weight 211 lb 8 oz (95.9 kg)    BMI (Calculated) 37.76    Single Leg Stand 30 seconds            Nutrition Therapy Plan and Nutrition Goals:  Nutrition Therapy & Goals - 09/16/20 1003      Nutrition Therapy   Diet Heart healthy, low Na    Drug/Food Interactions Statins/Certain Fruits    Protein  (specify units) 75g    Fiber 25 grams    Whole Grain Foods 3 servings    Saturated Fats 12 max. grams    Fruits and Vegetables 8 servings/day    Sodium 1.5 grams      Personal Nutrition Goals   Nutrition Goal ST: continue with current chnages - monitoring any barriers as she goes. LT: Continue with current changes    Comments B: cheerios with 2% milk, smoothies with yogurt and chia seeds L: tuna and crackers, salad, celery and cream cheese - loves all raw vegetables and fruit D: Salads (chicken or fish): if she makes pasta its with vegetable noodles, ground [place], and low Na sauce. She pays very close attention to her salt. She likes to have nuts and seeds and berries. She likes whole grain products like whole wheat pasta and oatmeal. She has mad a lot of changes since her heart attack. Discussed heart healthy eating and low Na eating.      Intervention Plan   Intervention Prescribe, educate and counsel regarding individualized specific dietary modifications aiming towards targeted core components such as weight, hypertension, lipid management, diabetes, heart failure and other comorbidities.;Nutrition handout(s) given to patient.    Expected Outcomes Short Term Goal: Understand basic principles of dietary content, such as calories, fat, sodium, cholesterol and nutrients.;Short Term Goal: A plan has been developed with personal nutrition goals set during dietitian appointment.;Long Term Goal: Adherence to prescribed nutrition plan.           Nutrition Assessments:  MEDIFICTS Score Key:  ?70 Need to make dietary changes   40-70 Heart Healthy Diet  ? 40 Therapeutic Level Cholesterol Diet  Flowsheet Row Cardiac Rehab from 09/16/2020 in [facility] Cardiac and Pulmonary Rehab  Picture Your Plate Total Score on Admission 83     Picture Your Plate Scores:  <16 Unhealthy dietary pattern with much room for improvement.  41-50 Dietary pattern unlikely to meet recommendations for good health and  room for improvement.  51-60 More healthful dietary pattern, with some room for improvement.   >60 Healthy dietary pattern, although there may be some specific behaviors that could be improved.    Nutrition Goals Re-Evaluation:   Nutrition Goals Discharge (Final Nutrition Goals Re-Evaluation):   Psychosocial: Target Goals: Acknowledge presence or absence of significant depression and/or stress, maximize coping skills, provide positive support system. Participant is able to verbalize types  and ability to use techniques and skills needed for reducing stress and depression.   Education: Stress, Anxiety, and Depression - Group verbal and visual presentation to define topics covered.  Reviews how body is impacted by stress, anxiety, and depression.  Also discusses healthy ways to reduce stress and to treat/manage anxiety and depression.  Written material given at graduation. Flowsheet Row Cardiac Rehab from 09/16/2020 in [facility] Cardiac and Pulmonary Rehab  Education need identified 09/16/20      Education: Sleep Hygiene -Provides group verbal and written instruction about how sleep can affect your health.  Define sleep hygiene, discuss sleep cycles and impact of sleep habits. Review good sleep hygiene tips.    Initial Review & Psychosocial Screening:  Initial Psych Review & Screening - 09/13/20 1513      Initial Review   Current issues with Current Sleep Concerns;Current Stress Concerns    Source of Stress Concerns Chronic Illness;Unable to perform yard/household activities;Unable to participate in former interests or hobbies      Family Dynamics   Good Support System? Yes   husband     Barriers   Psychosocial barriers to participate in program There are no identifiable barriers or psychosocial needs.;The patient should benefit from training in stress management and relaxation.      Screening Interventions   Interventions Encouraged to exercise;To provide support and resources with  identified psychosocial needs;Provide feedback about the scores to participant    Expected Outcomes Short Term goal: Utilizing psychosocial counselor, staff and physician to assist with identification of specific Stressors or current issues interfering with healing process. Setting desired goal for each stressor or current issue identified.;Long Term Goal: Stressors or current issues are controlled or eliminated.;Short Term goal: Identification and review with participant of any Quality of Life or Depression concerns found by scoring the questionnaire.;Long Term goal: The participant improves quality of Life and PHQ9 Scores as seen by post scores and/or verbalization of changes           Quality of Life Scores:   Quality of Life - 09/16/20 1501      Quality of Life   Select Quality of Life      Quality of Life Scores   Health/Function Pre 25.43 %    Socioeconomic Pre 28.5 %    Psych/Spiritual Pre 28.29 %    Family Pre 25.2 %    GLOBAL Pre 26.62 %          Scores of 19 and below usually indicate a poorer quality of life in these areas.  A difference of  2-3 points is a clinically meaningful difference.  A difference of 2-3 points in the total score of the Quality of Life Index has been associated with significant improvement in overall quality of life, self-image, physical symptoms, and general health in studies assessing change in quality of life.  PHQ-9: Recent Review Flowsheet Data    Depression screen [facility] 2/9 09/16/2020 02/12/2016 03/08/2013   Decreased Interest 0 0 0   Down, Depressed, Hopeless 0 0 0   PHQ - 2 Score 0 0 0   Altered sleeping 0 - -   Tired, decreased energy 3 - -   Change in appetite 1 - -   Feeling bad or failure about yourself  0 - -   Trouble concentrating 0 - -   Moving slowly or fidgety/restless 0 - -   Suicidal thoughts 0 - -   PHQ-9 Score 4 - -   Difficult doing work/chores Not  difficult at all - -     Interpretation of Total Score  Total Score  Depression Severity:  1-4 = Minimal depression, 5-9 = Mild depression, 10-14 = Moderate depression, 15-19 = Moderately severe depression, 20-27 = Severe depression   Psychosocial Evaluation and Intervention:  Psychosocial Evaluation - 09/13/20 1529      Psychosocial Evaluation & Interventions   Interventions Encouraged to exercise with the program and follow exercise prescription;Stress management education;Relaxation education    Comments Whisper reports doing "okay" since MI. It definitely came as a surprise to her and her family so she is very motivated to make lifestyle changes to prevent further issues. She lives with her husband, 54 year old daughter, 44 year old daughter (until her military husband gets stationed somewhere) and her 62 year old nephew. She feels well supported by them and her other family. She has had psoriatic arthritis since she was about 40 years old, so chronic pain has been an issue for a long time. She feels like her medications are treating her symptoms well, but does have issues with sleep recently. Her MD gave her some medicine to sleep which is helping some. She is disabled and stays busy with her kids and animals. She quit smoking the day of her MI (07/27/20) and is using the patch which she states is going well. She does have a history of depression which is being managed well. A fear of exercising is present after her MI so she is hoping this program will help build her confidence.    Expected Outcomes Short: attend cardiac rehab for education and exercise. Long: develop and maintain positive self care habits    Continue Psychosocial Services  Follow up required by staff           Psychosocial Re-Evaluation:   Psychosocial Discharge (Final Psychosocial Re-Evaluation):   Vocational Rehabilitation: Provide vocational rehab assistance to qualifying candidates.   Vocational Rehab Evaluation & Intervention:  Vocational Rehab - 09/13/20 1513      Initial  Vocational Rehab Evaluation & Intervention   Assessment shows need for Vocational Rehabilitation No           Education: Education Goals: Education classes will be provided on a variety of topics geared toward better understanding of heart health and risk factor modification. Participant will state understanding/return demonstration of topics presented as noted by education test scores.  Learning Barriers/Preferences:  Learning Barriers/Preferences - 09/13/20 1512      Learning Barriers/Preferences   Learning Barriers None    Learning Preferences None           General Cardiac Education Topics:  AED/CPR: - Group verbal and written instruction with the use of models to demonstrate the basic use of the AED with the basic ABC's of resuscitation.   Anatomy and Cardiac Procedures: - Group verbal and visual presentation and models provide information about basic cardiac anatomy and function. Reviews the testing methods done to diagnose heart disease and the outcomes of the test results. Describes the treatment choices: Medical Management, Angioplasty, or Coronary Bypass Surgery for treating various heart conditions including Myocardial Infarction, Angina, Valve Disease, and Cardiac Arrhythmias.  Written material given at graduation. Flowsheet Row Cardiac Rehab from 09/16/2020 in [facility] Cardiac and Pulmonary Rehab  Education need identified 09/16/20      Medication Safety: - Group verbal and visual instruction to review commonly prescribed medications for heart and lung disease. Reviews the medication, class of the drug, and side effects. Includes the steps to properly  store meds and maintain the prescription regimen.  Written material given at graduation.   Intimacy: - Group verbal instruction through game format to discuss how heart and lung disease can affect sexual intimacy. Written material given at graduation..   Know Your Numbers and Heart Failure: - Group verbal and visual  instruction to discuss disease risk factors for cardiac and pulmonary disease and treatment options.  Reviews associated critical values for Overweight/Obesity, Hypertension, Cholesterol, and Diabetes.  Discusses basics of heart failure: signs/symptoms and treatments.  Introduces Heart Failure Zone chart for action plan for heart failure.  Written material given at graduation. Flowsheet Row Cardiac Rehab from 09/16/2020 in [facility] Cardiac and Pulmonary Rehab  Education need identified 09/16/20      Infection Prevention: - Provides verbal and written material to individual with discussion of infection control including proper hand washing and proper equipment cleaning during exercise session. Flowsheet Row Cardiac Rehab from 09/16/2020 in [facility] Card[facility]  Date 09/16/20  Educator [facility]  Instruction Review Code 1- Verbalizes Understanding      Falls Prevention: - Provides verbal and written material to individual with discussion of falls prevention and safety. Flowsheet Row Cardiac Rehab from 09/16/2020 in [facility] Cardiac [facility] Pulmonary Rehab  Date 09/16/20  Educator [facility]  Instruction Review Code 1- Verbalizes Understanding      Other: -Provides group and verbal instruction on various topics (see comments)   Knowledge Questionnaire Score:  Knowledge Questionnaire Score - 09/16/20 1502      Knowledge Questionnaire Score   Pre Score 18/26 Education Focus: MI, angina, heart failure, nurtrition, exercise, diabetes, depression           Core Components/Risk Factors/Patient Goals at Admission:  Personal Goals and Risk Factors at Admission - 09/16/20 1502      Core Components/Risk Factors/Patient Goals on Admission    Weight Management Weight Loss;Yes;Obesity    Intervention Weight Management: Develop a combined nutrition and exercise program designed to reach desired caloric intake, while maintaining appropriate intake of nutrient and fiber, sodium and fats, and appropriate energy  expenditure required for the weight goal.;Weight Management: Provide education and appropriate resources to help participant work on and attain dietary goals.;Weight Management/Obesity: Establish reasonable short term and long term weight goals.;Obesity: Provide education and appropriate resources to help participant work on and attain dietary goals.    Admit Weight 211 lb 8 oz (95.9 kg)    Goal Weight: Short Term 206 lb (93.4 kg)    Goal Weight: Long Term 200 lb (90.7 kg)    Expected Outcomes Short Term: Continue to assess and modify interventions until short term weight is achieved;Long Term: Adherence to nutrition and physical activity/exercise program aimed toward attainment of established weight goal;Weight Loss: Understanding of general recommendations for a balanced deficit meal plan, which promotes 1-2 lb weight loss per week and includes a negative energy balance of [phone] kcal/d;Understanding recommendations for meals to include 15-35% energy as protein, 25-35% energy from fat, 35-60% energy from carbohydrates, less than  of dietary cholesterol, 20-35 gm of total fiber daily;Understanding of distribution of calorie intake throughout the day with the consumption of 4-5 meals/snacks    Tobacco Cessation Yes    Number of packs per day Quit on 07/27/20 (Day of MI)- on patch    Intervention Offer self-teaching materials, assist with locating and accessing local/national Quit Smoking programs, and support quit date choice.;Assist the participant in steps to quit. Provide individualized education and counseling about committing to Tobacco Cessation, relapse prevention, and  pharmacological support that can be provided by physician.    Expected Outcomes Short Term: Will quit all tobacco product use, adhering to prevention of relapse plan.;Long Term: Complete abstinence from all tobacco products for at least 12 months from quit date.    Hypertension Yes    Intervention Provide education on lifestyle  modifcations including regular physical activity/exercise, weight management, moderate sodium restriction and increased consumption of fresh fruit, vegetables, and low fat dairy, alcohol moderation, and smoking cessation.;Monitor prescription use compliance.    Expected Outcomes Short Term: Continued assessment and intervention until BP is < 140/26mm HG in hypertensive participants. < 130/29mm HG in hypertensive participants with diabetes, heart failure or chronic kidney disease.;Long Term: Maintenance of blood pressure at goal levels.    Lipids Yes    Intervention Provide education and support for participant on nutrition & aerobic/resistive exercise along with prescribed medications to achieve LDL 70mg , HDL >40mg .    Expected Outcomes Short Term: Participant states understanding of desired cholesterol values and is compliant with medications prescribed. Participant is following exercise prescription and nutrition guidelines.;Long Term: Cholesterol controlled with medications as prescribed, with individualized exercise RX and with personalized nutrition plan. Value goals: LDL < , HDL > 40 mg.           Education:Diabetes - Individual verbal and written instruction to review signs/symptoms of diabetes, desired ranges of glucose level fasting, after meals and with exercise. Acknowledge that pre and post exercise glucose checks will be done for 3 sessions at entry of program.   Core Components/Risk Factors/Patient Goals Review:    Core Components/Risk Factors/Patient Goals at Discharge (Final Review):    ITP Comments:  ITP Comments    Row Name 09/13/20 1505 09/16/20 1459         ITP Comments Initial telephone orientation completed. Documentation can be found in [facility] 12/25. EP orientation scheduled for Monday 2/14 at 9am. Completed and gym orientation. Initial ITP created and sent for review to Dr. Bethann Punches, Medical Director.             Comments: Initial ITP

## 2020-09-16 NOTE — Patient Instructions (Signed)
Patient Instructions  Patient Details  Name: Chelsea Nolan MRN: [id number] Date of Birth: 1980-10-06 Referring Provider:  Yisroel Ramming, MD  Below are your personal goals for exercise, nutrition, and risk factors. Our goal is to help you stay on track towards obtaining and maintaining these goals. We will be discussing your progress on these goals with you throughout the program.  Initial Exercise Prescription:  Initial Exercise Prescription - 09/16/20 1400      Date of Initial Exercise RX and Referring Provider   Date 09/16/20    Referring Provider Christella Noa MD      Treadmill   MPH 2.8    Grade 2    Minutes 15    METs 3.81      NuStep   Level 3    SPM 80    Minutes 15    METs 3      REL-XR   Level 3    Speed 50    Minutes 15    METs 3      Prescription Details   Frequency (times per week) 3    Duration Progress to 30 minutes of continuous aerobic without signs/symptoms of physical distress      Intensity   THRR 40-80% of Max Heartrate 122-161    Ratings of Perceived Exertion 11-13    Perceived Dyspnea 0-4      Progression   Progression Continue to progress workloads to maintain intensity without signs/symptoms of physical distress.      Resistance Training   Training Prescription Yes    Weight 4 lb    Reps 10-15           Exercise Goals: Frequency: Be able to perform aerobic exercise two to three times per week in program working toward 2-5 days per week of home exercise.  Intensity: Work with a perceived exertion of 11 (fairly light) - 15 (hard) while following your exercise prescription.  We will make changes to your prescription with you as you progress through the program.   Duration: Be able to do 30 to 45 minutes of continuous aerobic exercise in addition to a 5 minute warm-up and a 5 minute cool-down routine.   Nutrition Goals: Your personal nutrition goals will be established when you do your nutrition analysis with the dietician.  The  following are general nutrition guidelines to follow: Cholesterol < 200mg /day Sodium < 1500mg /day Fiber: Women under 50 yrs - 25 grams per day  Personal Goals:  Personal Goals and Risk Factors at Admission - 09/16/20 1502      Core Components/Risk Factors/Patient Goals on Admission    Weight Management Weight Loss;Yes;Obesity    Intervention Weight Management: Develop a combined nutrition and exercise program designed to reach desired caloric intake, while maintaining appropriate intake of nutrient and fiber, sodium and fats, and appropriate energy expenditure required for the weight goal.;Weight Management: Provide education and appropriate resources to help participant work on and attain dietary goals.;Weight Management/Obesity: Establish reasonable short term and long term weight goals.;Obesity: Provide education and appropriate resources to help participant work on and attain dietary goals.    Admit Weight 211 lb 8 oz (95.9 kg)    Goal Weight: Short Term 206 lb (93.4 kg)    Goal Weight: Long Term 200 lb (90.7 kg)    Expected Outcomes Short Term: Continue to assess and modify interventions until short term weight is achieved;Long Term: Adherence to nutrition and physical activity/exercise program aimed toward attainment of established weight goal;Weight Loss:  Understanding of general recommendations for a balanced deficit meal plan, which promotes 1-2 lb weight loss per week and includes a negative energy balance of [phone] kcal/d;Understanding recommendations for meals to include 15-35% energy as protein, 25-35% energy from fat, 35-60% energy from carbohydrates, less than 200mg  of dietary cholesterol, 20-35 gm of total fiber daily;Understanding of distribution of calorie intake throughout the day with the consumption of 4-5 meals/snacks    Tobacco Cessation Yes    Number of packs per day Quit on 07/27/20 (Day of MI)- on patch    Intervention Offer self-teaching materials, assist with locating  and accessing local/national Quit Smoking programs, and support quit date choice.;Assist the participant in steps to quit. Provide individualized education and counseling about committing to Tobacco Cessation, relapse prevention, and pharmacological support that can be provided by physician.    Expected Outcomes Short Term: Will quit all tobacco product use, adhering to prevention of relapse plan.;Long Term: Complete abstinence from all tobacco products for at least 12 months from quit date.    Hypertension Yes    Intervention Provide education on lifestyle modifcations including regular physical activity/exercise, weight management, moderate sodium restriction and increased consumption of fresh fruit, vegetables, and low fat dairy, alcohol moderation, and smoking cessation.;Monitor prescription use compliance.    Expected Outcomes Short Term: Continued assessment and intervention until BP is < 140/28mm HG in hypertensive participants. < 130/76mm HG in hypertensive participants with diabetes, heart failure or chronic kidney disease.;Long Term: Maintenance of blood pressure at goal levels.    Lipids Yes    Intervention Provide education and support for participant on nutrition & aerobic/resistive exercise along with prescribed medications to achieve LDL 70mg , HDL >40mg .    Expected Outcomes Short Term: Participant states understanding of desired cholesterol values and is compliant with medications prescribed. Participant is following exercise prescription and nutrition guidelines.;Long Term: Cholesterol controlled with medications as prescribed, with individualized exercise RX and with personalized nutrition plan. Value goals: LDL < 70mg , HDL > 40 mg.           Tobacco Use Initial Evaluation: Social History   Tobacco Use  Smoking Status Former Smoker  . Years: 15.00  . Types: Cigarettes  . Quit date: 07/27/2020  . Years since quitting: 0.1  Smokeless Tobacco Never Used    Exercise Goals and  Review:  Exercise Goals    Row Name 09/16/20 1500             Exercise Goals   Increase Physical Activity Yes       Intervention Provide advice, education, support and counseling about physical activity/exercise needs.;Develop an individualized exercise prescription for aerobic and resistive training based on initial evaluation findings, risk stratification, comorbidities and participant's personal goals.       Expected Outcomes Short Term: Attend rehab on a regular basis to increase amount of physical activity.;Long Term: Add in home exercise to make exercise part of routine and to increase amount of physical activity.;Long Term: Exercising regularly at least 3-5 days a week.       Increase Strength and Stamina Yes       Intervention Provide advice, education, support and counseling about physical activity/exercise needs.;Develop an individualized exercise prescription for aerobic and resistive training based on initial evaluation findings, risk stratification, comorbidities and participant's personal goals.       Expected Outcomes Short Term: Increase workloads from initial exercise prescription for resistance, speed, and METs.;Short Term: Perform resistance training exercises routinely during rehab and add in resistance training at  home;Long Term: Improve cardiorespiratory fitness, muscular endurance and strength as measured by increased METs and functional capacity ( )       Able to understand and use rate of perceived exertion (RPE) scale Yes       Intervention Provide education and explanation on how to use RPE scale       Expected Outcomes Short Term: Able to use RPE daily in rehab to express subjective intensity level;Long Term:  Able to use RPE to guide intensity level when exercising independently       Able to understand and use Dyspnea scale Yes       Intervention Provide education and explanation on how to use Dyspnea scale       Expected Outcomes Short Term: Able to use Dyspnea  scale daily in rehab to express subjective sense of shortness of breath during exertion;Long Term: Able to use Dyspnea scale to guide intensity level when exercising independently       Knowledge and understanding of Target Heart Rate Range (THRR) Yes       Intervention Provide education and explanation of THRR including how the numbers were predicted and where they are located for reference       Expected Outcomes Short Term: Able to state/look up THRR;Short Term: Able to use daily as guideline for intensity in rehab;Long Term: Able to use THRR to govern intensity when exercising independently       Able to check pulse independently Yes       Intervention Provide education and demonstration on how to check pulse in carotid and radial arteries.;Review the importance of being able to check your own pulse for safety during independent exercise       Expected Outcomes Short Term: Able to explain why pulse checking is important during independent exercise;Long Term: Able to check pulse independently and accurately       Understanding of Exercise Prescription Yes       Intervention Provide education, explanation, and written materials on patient's individual exercise prescription       Expected Outcomes Short Term: Able to explain program exercise prescription;Long Term: Able to explain home exercise prescription to exercise independently              Copy of goals given to participant.

## 2020-09-18 ENCOUNTER — Ambulatory Visit: Payer: Medicare Other

## 2020-09-20 ENCOUNTER — Ambulatory Visit: Payer: Medicare Other

## 2020-09-20 ENCOUNTER — Telehealth: Payer: Self-pay

## 2020-09-20 NOTE — Telephone Encounter (Signed)
Left voicemail for patient. She has not been to Rehab this week.

## 2020-09-23 ENCOUNTER — Ambulatory Visit: Payer: Medicare Other

## 2020-09-25 ENCOUNTER — Encounter: Payer: Self-pay | Admitting: *Deleted

## 2020-09-25 ENCOUNTER — Ambulatory Visit: Payer: Medicare Other

## 2020-09-25 ENCOUNTER — Telehealth: Payer: Self-pay | Admitting: *Deleted

## 2020-09-25 DIAGNOSIS — I214 Non-ST elevation (NSTEMI) myocardial infarction: Secondary | ICD-10-CM

## 2020-09-25 DIAGNOSIS — Z955 Presence of coronary angioplasty implant and graft: Secondary | ICD-10-CM

## 2020-09-25 NOTE — Progress Notes (Signed)
Cardiac Individual Treatment Plan  Patient Details  Name: Chelsea Nolan MRN: [id number] Date of Birth: 12-07-1980 Referring Provider:   Flowsheet Row Cardiac Rehab from 09/16/2020 in [facility] Cardiac and Pulmonary Rehab  Referring Provider Christella Noa MD      Initial Encounter Date:  Flowsheet Row Cardiac Rehab from 09/16/2020 in [facility] Cardiac and Pulmonary Rehab  Date 09/16/20      Visit Diagnosis: NSTEMI (non-ST elevated myocardial infarction) [facility])  Status post coronary artery stent placement  Patient's Home Medications on Admission:  Current Outpatient Medications:  .  alfuzosin (UROXATRAL) 10 MG 24 hr tablet, Take 10 mg by mouth daily., Disp: , Rfl:  .  amphetamine-dextroamphetamine (ADDERALL) 30 MG tablet, Take 1 tablet by mouth 2 (two) times daily. (Patient not taking: Reported on 09/13/2020), Disp: , Rfl:  .  aspirin 81 MG chewable tablet, Chew by mouth., Disp: , Rfl:  .  atorvastatin (LIPITOR) 80 MG tablet, Take by mouth., Disp: , Rfl:  .  diazepam (VALIUM) 10 MG tablet, Take 10 mg by mouth 3 (three) times daily., Disp: , Rfl:  .  fluvoxaMINE (LUVOX) 25 MG tablet, Take 25 mg by mouth 2 (two) times daily., Disp: , Rfl:  .  folic acid (FOLVITE) 1 MG tablet, Take 2 tablets by mouth daily. (Patient not taking: Reported on 09/13/2020), Disp: , Rfl:  .  gabapentin (NEURONTIN) 400 MG capsule, Take 400 mg by mouth 4 (four) times daily., Disp: , Rfl:  .  lidocaine (LIDODERM) 5 %, Place 1 patch onto the skin every 12 (twelve) hours. Remove & Discard patch within 12 hours or as directed by MD, Disp: 10 patch, Rfl: 0 .  methotrexate 250 MG/10ML injection, Inject into the skin., Disp: , Rfl:  .  metoprolol succinate (TOPROL-XL) 50 MG 24 hr tablet, Take by mouth., Disp: , Rfl:  .  nicotine (NICODERM CQ - DOSED IN MG/24 HOURS) 21 mg/24hr patch, Place onto the skin., Disp: , Rfl:  .  nitroGLYCERIN (NITROSTAT) 0.4 MG SL tablet, Place under the tongue., Disp: , Rfl:  .  olmesartan (BENICAR) 40 MG  tablet, Take by mouth., Disp: , Rfl:  .  oxyCODONE (OXY IR/ROXICODONE) 5 MG immediate release tablet, TAKE 1 TABLET (5 MG TOTAL) BY MOUTH EVERY FOUR (4) HOURS AS NEEDED FOR PAIN FOR UP TO 5 DAYS., Disp: , Rfl:  .  prazosin (MINIPRESS) 2 MG capsule, Take 2-4 mg by mouth at bedtime. , Disp: , Rfl:  .  predniSONE (DELTASONE) 20 MG tablet, Take 20 mg by mouth 3 (three) times daily. , Disp: , Rfl:  .  promethazine (PHENERGAN) 25 MG tablet, Take 25 mg by mouth every 6 (six) hours as needed. (Patient not taking: Reported on 09/13/2020), Disp: , Rfl:  .  ticagrelor (BRILINTA) 90 MG TABS tablet, Take by mouth., Disp: , Rfl:  .  triamcinolone ointment (KENALOG) 0.1 %, APPLY TO AFFECTED AREA TWICE A DAY, Disp: , Rfl:  .  VENTOLIN HFA 108 (90 Base) MCG/ACT inhaler, Inhale 1-2 puffs into the lungs every 4 (four) hours as needed. , Disp: , Rfl:   Past Medical History: Past Medical History:  Diagnosis Date  . Anxiety   . Arthritis   . Depression   . GERD (gastroesophageal reflux disease)   . Hypertension   . Kidney stones   . Migraine headache   . Obesity   . Psoriatic arthritis [facility])    Dermatologist - Dr. Adolphus Birchwood, Rheumatologist - Dr. Gavin Potters  . Rheumatoid arthritis (HCC)  Tobacco Use: Social History   Tobacco Use  Smoking Status Former Smoker  . Years: 15.00  . Types: Cigarettes  . Quit date: 07/27/2020  . Years since quitting: 0.1  Smokeless Tobacco Never Used    Labs: Recent Review Flowsheet Data   There is no flowsheet data to display.      Exercise Target Goals: Exercise Program Goal: Individual exercise prescription set using results from initial 6 min walk test and THRR while considering  patient's activity barriers and safety.   Exercise Prescription Goal: Initial exercise prescription builds to 30-45 minutes a day of aerobic activity, 2-3 days per week.  Home exercise guidelines will be given to patient during program as part of exercise prescription that the  participant will acknowledge.   Education: Aerobic Exercise: - Group verbal and visual presentation on the components of exercise prescription. Introduces F.I.T.T principle from ACSM for exercise prescriptions.  Reviews F.I.T.T. principles of aerobic exercise including progression. Written material given at graduation. Flowsheet Row Cardiac Rehab from 09/16/2020 in [facility] Cardiac and Pulmonary Rehab  Date 09/16/20  Educator [facility]  Instruction Review Code 1- Verbalizes Understanding      Education: Resistance Exercise: - Group verbal and visual presentation on the components of exercise prescription. Introduces F.I.T.T principle from ACSM for exercise prescriptions  Reviews F.I.T.T. principles of resistance exercise including progression. Written material given at graduation.    Education: Exercise & Equipment Safety: - Individual verbal instruction and demonstration of equipment use and safety with use of the equipment. Flowsheet Row Cardiac Rehab from 09/16/2020 in [facility] Cardiac and Pulmonary Rehab  Date 09/16/20  Educator [facility]  Instruction Review Code 1- Verbalizes Understanding      Education: Exercise Physiology & General Exercise Guidelines: - Group verbal and written instruction with models to review the exercise physiology of the cardiovascular system and associated critical values. Provides general exercise guidelines with specific guidelines to those with heart or lung disease.    Education: Flexibility, Balance, Mind/Body Relaxation: - Group verbal and visual presentation with interactive activity on the components of exercise prescription. Introduces F.I.T.T principle from ACSM for exercise prescriptions. Reviews F.I.T.T. principles of flexibility and balance exercise training including progression. Also discusses the mind body connection.  Reviews various relaxation techniques to help reduce and manage stress (i.e. Deep breathing, progressive muscle relaxation, and visualization).  Balance handout provided to take home. Written material given at graduation.   Activity Barriers & Risk Stratification:  Activity Barriers & Cardiac Risk Stratification - 09/16/20 1456      Activity Barriers & Cardiac Risk Stratification   Activity Barriers Arthritis;Joint Problems;Back Problems;Deconditioning;Muscular Weakness;Shortness of Breath    Cardiac Risk Stratification High           6 Minute Walk:  6 Minute Walk    Row Name 09/16/20 1455         6 Minute Walk   Phase Initial     Distance 1385 feet     Walk Time 6 minutes     # of Rest Breaks 0     MPH 2.62     METS 4.54     RPE 7     Perceived Dyspnea  1     VO2 Peak 15.9     Symptoms Yes (comment)     Comments SOB     Resting HR 82 bpm     Resting BP 124/72     Resting Oxygen Saturation  95 %     Exercise Oxygen Saturation  during 6  min walk 97 %     Max Ex. HR 112 bpm     Max Ex. BP 166/64     2 Minute Post BP 110/60            Oxygen Initial Assessment:   Oxygen Re-Evaluation:   Oxygen Discharge (Final Oxygen Re-Evaluation):   Initial Exercise Prescription:  Initial Exercise Prescription - 09/16/20 1400      Date of Initial Exercise RX and Referring Provider   Date 09/16/20    Referring Provider Christella Noa MD      Treadmill   MPH 2.8    Grade 2    Minutes 15    METs 3.81      NuStep   Level 3    SPM 80    Minutes 15    METs 3      REL-XR   Level 3    Speed 50    Minutes 15    METs 3      Prescription Details   Frequency (times per week) 3    Duration Progress to 30 minutes of continuous aerobic without signs/symptoms of physical distress      Intensity   THRR 40-80% of Max Heartrate 122-161    Ratings of Perceived Exertion 11-13    Perceived Dyspnea 0-4      Progression   Progression Continue to progress workloads to maintain intensity without signs/symptoms of physical distress.      Resistance Training   Training Prescription Yes    Weight 4 lb    Reps  10-15           Perform Capillary Blood Glucose checks as needed.  Exercise Prescription Changes:  Exercise Prescription Changes    Row Name 09/16/20 1400             Response to Exercise   Blood Pressure (Admit) 124/72       Blood Pressure (Exercise) 166/64       Blood Pressure (Exit) 110/60       Heart Rate (Admit) 82 bpm       Heart Rate (Exercise) 112 bpm       Heart Rate (Exit) 87 bpm       Oxygen Saturation (Admit) 95 %       Oxygen Saturation (Exercise) 97 %       Rating of Perceived Exertion (Exercise) 7       Perceived Dyspnea (Exercise) 1       Symptoms SOB       Comments walk test results              Exercise Comments:   Exercise Goals and Review:  Exercise Goals    Row Name 09/16/20 1500             Exercise Goals   Increase Physical Activity Yes       Intervention Provide advice, education, support and counseling about physical activity/exercise needs.;Develop an individualized exercise prescription for aerobic and resistive training based on initial evaluation findings, risk stratification, comorbidities and participant's personal goals.       Expected Outcomes Short Term: Attend rehab on a regular basis to increase amount of physical activity.;Long Term: Add in home exercise to make exercise part of routine and to increase amount of physical activity.;Long Term: Exercising regularly at least 3-5 days a week.       Increase Strength and Stamina Yes       Intervention Provide advice, education, support and counseling  about physical activity/exercise needs.;Develop an individualized exercise prescription for aerobic and resistive training based on initial evaluation findings, risk stratification, comorbidities and participant's personal goals.       Expected Outcomes Short Term: Increase workloads from initial exercise prescription for resistance, speed, and METs.;Short Term: Perform resistance training exercises routinely during rehab and add in  resistance training at home;Long Term: Improve cardiorespiratory fitness, muscular endurance and strength as measured by increased METs and functional capacity ( )       Able to understand and use rate of perceived exertion (RPE) scale Yes       Intervention Provide education and explanation on how to use RPE scale       Expected Outcomes Short Term: Able to use RPE daily in rehab to express subjective intensity level;Long Term:  Able to use RPE to guide intensity level when exercising independently       Able to understand and use Dyspnea scale Yes       Intervention Provide education and explanation on how to use Dyspnea scale       Expected Outcomes Short Term: Able to use Dyspnea scale daily in rehab to express subjective sense of shortness of breath during exertion;Long Term: Able to use Dyspnea scale to guide intensity level when exercising independently       Knowledge and understanding of Target Heart Rate Range (THRR) Yes       Intervention Provide education and explanation of THRR including how the numbers were predicted and where they are located for reference       Expected Outcomes Short Term: Able to state/look up THRR;Short Term: Able to use daily as guideline for intensity in rehab;Long Term: Able to use THRR to govern intensity when exercising independently       Able to check pulse independently Yes       Intervention Provide education and demonstration on how to check pulse in carotid and radial arteries.;Review the importance of being able to check your own pulse for safety during independent exercise       Expected Outcomes Short Term: Able to explain why pulse checking is important during independent exercise;Long Term: Able to check pulse independently and accurately       Understanding of Exercise Prescription Yes       Intervention Provide education, explanation, and written materials on patient's individual exercise prescription       Expected Outcomes Short Term: Able to  explain program exercise prescription;Long Term: Able to explain home exercise prescription to exercise independently              Exercise Goals Re-Evaluation :   Discharge Exercise Prescription (Final Exercise Prescription Changes):  Exercise Prescription Changes - 09/16/20 1400      Response to Exercise   Blood Pressure (Admit) 124/72    Blood Pressure (Exercise) 166/64    Blood Pressure (Exit) 110/60    Heart Rate (Admit) 82 bpm    Heart Rate (Exercise) 112 bpm    Heart Rate (Exit) 87 bpm    Oxygen Saturation (Admit) 95 %    Oxygen Saturation (Exercise) 97 %    Rating of Perceived Exertion (Exercise) 7    Perceived Dyspnea (Exercise) 1    Symptoms SOB    Comments walk test results           Nutrition:  Target Goals: Understanding of nutrition guidelines, daily intake of sodium 1500mg , cholesterol 200mg , calories 30% from fat and 7% or less from saturated fats, daily to  have 5 or more servings of fruits and vegetables.  Education: All About Nutrition: -Group instruction provided by verbal, written material, interactive activities, discussions, models, and posters to present general guidelines for heart healthy nutrition including fat, fiber, MyPlate, the role of sodium in heart healthy nutrition, utilization of the nutrition label, and utilization of this knowledge for meal planning. Follow up email sent as well. Written material given at graduation. Flowsheet Row Cardiac Rehab from 09/16/2020 in [facility] Cardiac and Pulmonary Rehab  Education need identified 09/16/20      Biometrics:  Pre Biometrics - 09/16/20 1501      Pre Biometrics   Height 5' 2.75" (1.594 m)    Weight 211 lb 8 oz (95.9 kg)    BMI (Calculated) 37.76    Single Leg Stand 30 seconds            Nutrition Therapy Plan and Nutrition Goals:  Nutrition Therapy & Goals - 09/16/20 1003      Nutrition Therapy   Diet Heart healthy, low Na    Drug/Food Interactions Statins/Certain Fruits    Protein  (specify units) 75g    Fiber 25 grams    Whole Grain Foods 3 servings    Saturated Fats 12 max. grams    Fruits and Vegetables 8 servings/day    Sodium 1.5 grams      Personal Nutrition Goals   Nutrition Goal ST: continue with current chnages - monitoring any barriers as she goes. LT: Continue with current changes    Comments B: cheerios with 2% milk, smoothies with yogurt and chia seeds L: tuna and crackers, salad, celery and cream cheese - loves all raw vegetables and fruit D: Salads (chicken or fish): if she makes pasta its with vegetable noodles, ground [place], and low Na sauce. She pays very close attention to her salt. She likes to have nuts and seeds and berries. She likes whole grain products like whole wheat pasta and oatmeal. She has mad a lot of changes since her heart attack. Discussed heart healthy eating and low Na eating.      Intervention Plan   Intervention Prescribe, educate and counsel regarding individualized specific dietary modifications aiming towards targeted core components such as weight, hypertension, lipid management, diabetes, heart failure and other comorbidities.;Nutrition handout(s) given to patient.    Expected Outcomes Short Term Goal: Understand basic principles of dietary content, such as calories, fat, sodium, cholesterol and nutrients.;Short Term Goal: A plan has been developed with personal nutrition goals set during dietitian appointment.;Long Term Goal: Adherence to prescribed nutrition plan.           Nutrition Assessments:  MEDIFICTS Score Key:  ?70 Need to make dietary changes   40-70 Heart Healthy Diet  ? 40 Therapeutic Level Cholesterol Diet  Flowsheet Row Cardiac Rehab from 09/16/2020 in [facility] Cardiac and Pulmonary Rehab  Picture Your Plate Total Score on Admission 83     Picture Your Plate Scores:  <16 Unhealthy dietary pattern with much room for improvement.  41-50 Dietary pattern unlikely to meet recommendations for good health and  room for improvement.  51-60 More healthful dietary pattern, with some room for improvement.   >60 Healthy dietary pattern, although there may be some specific behaviors that could be improved.    Nutrition Goals Re-Evaluation:   Nutrition Goals Discharge (Final Nutrition Goals Re-Evaluation):   Psychosocial: Target Goals: Acknowledge presence or absence of significant depression and/or stress, maximize coping skills, provide positive support system. Participant is able to verbalize types  and ability to use techniques and skills needed for reducing stress and depression.   Education: Stress, Anxiety, and Depression - Group verbal and visual presentation to define topics covered.  Reviews how body is impacted by stress, anxiety, and depression.  Also discusses healthy ways to reduce stress and to treat/manage anxiety and depression.  Written material given at graduation. Flowsheet Row Cardiac Rehab from 09/16/2020 in [facility] Cardiac and Pulmonary Rehab  Education need identified 09/16/20      Education: Sleep Hygiene -Provides group verbal and written instruction about how sleep can affect your health.  Define sleep hygiene, discuss sleep cycles and impact of sleep habits. Review good sleep hygiene tips.    Initial Review & Psychosocial Screening:  Initial Psych Review & Screening - 09/13/20 1513      Initial Review   Current issues with Current Sleep Concerns;Current Stress Concerns    Source of Stress Concerns Chronic Illness;Unable to perform yard/household activities;Unable to participate in former interests or hobbies      Family Dynamics   Good Support System? Yes   husband     Barriers   Psychosocial barriers to participate in program There are no identifiable barriers or psychosocial needs.;The patient should benefit from training in stress management and relaxation.      Screening Interventions   Interventions Encouraged to exercise;To provide support and resources with  identified psychosocial needs;Provide feedback about the scores to participant    Expected Outcomes Short Term goal: Utilizing psychosocial counselor, staff and physician to assist with identification of specific Stressors or current issues interfering with healing process. Setting desired goal for each stressor or current issue identified.;Long Term Goal: Stressors or current issues are controlled or eliminated.;Short Term goal: Identification and review with participant of any Quality of Life or Depression concerns found by scoring the questionnaire.;Long Term goal: The participant improves quality of Life and PHQ9 Scores as seen by post scores and/or verbalization of changes           Quality of Life Scores:   Quality of Life - 09/16/20 1501      Quality of Life   Select Quality of Life      Quality of Life Scores   Health/Function Pre 25.43 %    Socioeconomic Pre 28.5 %    Psych/Spiritual Pre 28.29 %    Family Pre 25.2 %    GLOBAL Pre 26.62 %          Scores of 19 and below usually indicate a poorer quality of life in these areas.  A difference of  2-3 points is a clinically meaningful difference.  A difference of 2-3 points in the total score of the Quality of Life Index has been associated with significant improvement in overall quality of life, self-image, physical symptoms, and general health in studies assessing change in quality of life.  PHQ-9: Recent Review Flowsheet Data    Depression screen [facility] 2/9 09/16/2020 02/12/2016 03/08/2013   Decreased Interest 0 0 0   Down, Depressed, Hopeless 0 0 0   PHQ - 2 Score 0 0 0   Altered sleeping 0 - -   Tired, decreased energy 3 - -   Change in appetite 1 - -   Feeling bad or failure about yourself  0 - -   Trouble concentrating 0 - -   Moving slowly or fidgety/restless 0 - -   Suicidal thoughts 0 - -   PHQ-9 Score 4 - -   Difficult doing work/chores Not  difficult at all - -     Interpretation of Total Score  Total Score  Depression Severity:  1-4 = Minimal depression, 5-9 = Mild depression, 10-14 = Moderate depression, 15-19 = Moderately severe depression, 20-27 = Severe depression   Psychosocial Evaluation and Intervention:  Psychosocial Evaluation - 09/13/20 1529      Psychosocial Evaluation & Interventions   Interventions Encouraged to exercise with the program and follow exercise prescription;Stress management education;Relaxation education    Comments Whisper reports doing "okay" since MI. It definitely came as a surprise to her and her family so she is very motivated to make lifestyle changes to prevent further issues. She lives with her husband, 54 year old daughter, 44 year old daughter (until her military husband gets stationed somewhere) and her 62 year old nephew. She feels well supported by them and her other family. She has had psoriatic arthritis since she was about 40 years old, so chronic pain has been an issue for a long time. She feels like her medications are treating her symptoms well, but does have issues with sleep recently. Her MD gave her some medicine to sleep which is helping some. She is disabled and stays busy with her kids and animals. She quit smoking the day of her MI (07/27/20) and is using the patch which she states is going well. She does have a history of depression which is being managed well. A fear of exercising is present after her MI so she is hoping this program will help build her confidence.    Expected Outcomes Short: attend cardiac rehab for education and exercise. Long: develop and maintain positive self care habits    Continue Psychosocial Services  Follow up required by staff           Psychosocial Re-Evaluation:   Psychosocial Discharge (Final Psychosocial Re-Evaluation):   Vocational Rehabilitation: Provide vocational rehab assistance to qualifying candidates.   Vocational Rehab Evaluation & Intervention:  Vocational Rehab - 09/13/20 1513      Initial  Vocational Rehab Evaluation & Intervention   Assessment shows need for Vocational Rehabilitation No           Education: Education Goals: Education classes will be provided on a variety of topics geared toward better understanding of heart health and risk factor modification. Participant will state understanding/return demonstration of topics presented as noted by education test scores.  Learning Barriers/Preferences:  Learning Barriers/Preferences - 09/13/20 1512      Learning Barriers/Preferences   Learning Barriers None    Learning Preferences None           General Cardiac Education Topics:  AED/CPR: - Group verbal and written instruction with the use of models to demonstrate the basic use of the AED with the basic ABC's of resuscitation.   Anatomy and Cardiac Procedures: - Group verbal and visual presentation and models provide information about basic cardiac anatomy and function. Reviews the testing methods done to diagnose heart disease and the outcomes of the test results. Describes the treatment choices: Medical Management, Angioplasty, or Coronary Bypass Surgery for treating various heart conditions including Myocardial Infarction, Angina, Valve Disease, and Cardiac Arrhythmias.  Written material given at graduation. Flowsheet Row Cardiac Rehab from 09/16/2020 in [facility] Cardiac and Pulmonary Rehab  Education need identified 09/16/20      Medication Safety: - Group verbal and visual instruction to review commonly prescribed medications for heart and lung disease. Reviews the medication, class of the drug, and side effects. Includes the steps to properly  store meds and maintain the prescription regimen.  Written material given at graduation.   Intimacy: - Group verbal instruction through game format to discuss how heart and lung disease can affect sexual intimacy. Written material given at graduation..   Know Your Numbers and Heart Failure: - Group verbal and visual  instruction to discuss disease risk factors for cardiac and pulmonary disease and treatment options.  Reviews associated critical values for Overweight/Obesity, Hypertension, Cholesterol, and Diabetes.  Discusses basics of heart failure: signs/symptoms and treatments.  Introduces Heart Failure Zone chart for action plan for heart failure.  Written material given at graduation. Flowsheet Row Cardiac Rehab from 09/16/2020 in [facility] Cardiac and Pulmonary Rehab  Education need identified 09/16/20      Infection Prevention: - Provides verbal and written material to individual with discussion of infection control including proper hand washing and proper equipment cleaning during exercise session. Flowsheet Row Cardiac Rehab from 09/16/2020 in [facility]  Date 09/16/20  Educator [facility]  Instruction Review Code 1- Verbalizes Understanding      Falls Prevention: - Provides verbal and written material to individual with discussion of falls prevention and safety. Flowsheet Row Cardiac Rehab from 09/16/2020 in [facility] Cardiac [facility] Pulmonary Rehab  Date 09/16/20  Educator [facility]  Instruction Review Code 1- Verbalizes Understanding      Other: -Provides group and verbal instruction on various topics (see comments)   Knowledge Questionnaire Score:  Knowledge Questionnaire Score - 09/16/20 1502      Knowledge Questionnaire Score   Pre Score 18/26 Education Focus: MI, angina, heart failure, nurtrition, exercise, diabetes, depression           Core Components/Risk Factors/Patient Goals at Admission:  Personal Goals and Risk Factors at Admission - 09/16/20 1502      Core Components/Risk Factors/Patient Goals on Admission    Weight Management Weight Loss;Yes;Obesity    Intervention Weight Management: Develop a combined nutrition and exercise program designed to reach desired caloric intake, while maintaining appropriate intake of nutrient and fiber, sodium and fats, and appropriate energy  expenditure required for the weight goal.;Weight Management: Provide education and appropriate resources to help participant work on and attain dietary goals.;Weight Management/Obesity: Establish reasonable short term and long term weight goals.;Obesity: Provide education and appropriate resources to help participant work on and attain dietary goals.    Admit Weight 211 lb 8 oz (95.9 kg)    Goal Weight: Short Term 206 lb (93.4 kg)    Goal Weight: Long Term 200 lb (90.7 kg)    Expected Outcomes Short Term: Continue to assess and modify interventions until short term weight is achieved;Long Term: Adherence to nutrition and physical activity/exercise program aimed toward attainment of established weight goal;Weight Loss: Understanding of general recommendations for a balanced deficit meal plan, which promotes 1-2 lb weight loss per week and includes a negative energy balance of [phone] kcal/d;Understanding recommendations for meals to include 15-35% energy as protein, 25-35% energy from fat, 35-60% energy from carbohydrates, less than  of dietary cholesterol, 20-35 gm of total fiber daily;Understanding of distribution of calorie intake throughout the day with the consumption of 4-5 meals/snacks    Tobacco Cessation Yes    Number of packs per day Quit on 07/27/20 (Day of MI)- on patch    Intervention Offer self-teaching materials, assist with locating and accessing local/national Quit Smoking programs, and support quit date choice.;Assist the participant in steps to quit. Provide individualized education and counseling about committing to Tobacco Cessation, relapse prevention, and  pharmacological support that can be provided by physician.    Expected Outcomes Short Term: Will quit all tobacco product use, adhering to prevention of relapse plan.;Long Term: Complete abstinence from all tobacco products for at least 12 months from quit date.    Hypertension Yes    Intervention Provide education on lifestyle  modifcations including regular physical activity/exercise, weight management, moderate sodium restriction and increased consumption of fresh fruit, vegetables, and low fat dairy, alcohol moderation, and smoking cessation.;Monitor prescription use compliance.    Expected Outcomes Short Term: Continued assessment and intervention until BP is < 140/61mm HG in hypertensive participants. < 130/65mm HG in hypertensive participants with diabetes, heart failure or chronic kidney disease.;Long Term: Maintenance of blood pressure at goal levels.    Lipids Yes    Intervention Provide education and support for participant on nutrition & aerobic/resistive exercise along with prescribed medications to achieve LDL 70mg , HDL >40mg .    Expected Outcomes Short Term: Participant states understanding of desired cholesterol values and is compliant with medications prescribed. Participant is following exercise prescription and nutrition guidelines.;Long Term: Cholesterol controlled with medications as prescribed, with individualized exercise RX and with personalized nutrition plan. Value goals: LDL < , HDL > 40 mg.           Education:Diabetes - Individual verbal and written instruction to review signs/symptoms of diabetes, desired ranges of glucose level fasting, after meals and with exercise. Acknowledge that pre and post exercise glucose checks will be done for 3 sessions at entry of program.   Core Components/Risk Factors/Patient Goals Review:   Goals and Risk Factor Review    Row Name 09/16/20 1517             Core Components/Risk Factors/Patient Goals Review   Personal Goals Review Tobacco Cessation       Review Phoebie has recently quit tobacco use within the last 6 months. Intervention for relapse prevention was provided at the initial medical review. He was encouraged to continue to with tobacco cessation and was provided information on relapse prevention. Patient received information about combination  therapy, tobacco cessation classes, quit line, and quit smoking apps in case of a relapse. Patient demonstrated understanding of this material.Staff will continue to provide encouragement and follow up with the patient throughout the program.       Expected Outcomes Short: Continued cessation Long: Complete Cessation              Core Components/Risk Factors/Patient Goals at Discharge (Final Review):   Goals and Risk Factor Review - 09/16/20 1517      Core Components/Risk Factors/Patient Goals Review   Personal Goals Review Tobacco Cessation    Review Zyona has recently quit tobacco use within the last 6 months. Intervention for relapse prevention was provided at the initial medical review. He was encouraged to continue to with tobacco cessation and was provided information on relapse prevention. Patient received information about combination therapy, tobacco cessation classes, quit line, and quit smoking apps in case of a relapse. Patient demonstrated understanding of this material.Staff will continue to provide encouragement and follow up with the patient throughout the program.    Expected Outcomes Short: Continued cessation Long: Complete Cessation           ITP Comments:  ITP Comments    Row Name 09/13/20 1505 09/16/20 1459 09/25/20 0648       ITP Comments Initial telephone orientation completed. Documentation can be found in [facility] 12/25. EP orientation scheduled for Monday 2/14 at 9am.  Completed and gym orientation. Initial ITP created and sent for review to Dr. Bethann Punches, Medical Director. 30 Day review completed. Medical Director ITP review done, changes made as directed, and signed approval by Medical Director.            Comments:

## 2020-09-25 NOTE — Telephone Encounter (Signed)
Called to check on patient. No show since orientation.  Second call attempt.

## 2020-09-27 ENCOUNTER — Ambulatory Visit: Payer: Medicare Other

## 2020-09-30 ENCOUNTER — Ambulatory Visit: Payer: Medicare Other

## 2020-10-02 ENCOUNTER — Encounter: Payer: Self-pay | Admitting: *Deleted

## 2020-10-02 ENCOUNTER — Ambulatory Visit: Payer: Medicare Other

## 2020-10-02 DIAGNOSIS — I214 Non-ST elevation (NSTEMI) myocardial infarction: Secondary | ICD-10-CM

## 2020-10-02 DIAGNOSIS — Z955 Presence of coronary angioplasty implant and graft: Secondary | ICD-10-CM

## 2020-10-02 NOTE — Progress Notes (Signed)
Cardiac Individual Treatment Plan  Patient Details  Name: Chelsea Nolan MRN: [id number] Date of Birth: June 10, 1981 Referring Provider:   Flowsheet Row Cardiac Rehab from 09/16/2020 in [facility] Cardiac and Pulmonary Rehab  Referring Provider Christella Noa MD      Initial Encounter Date:  Flowsheet Row Cardiac Rehab from 09/16/2020 in [facility] Cardiac and Pulmonary Rehab  Date 09/16/20      Visit Diagnosis: NSTEMI (non-ST elevated myocardial infarction) [facility])  Status post coronary artery stent placement  Patient's Home Medications on Admission:  Current Outpatient Medications:    alfuzosin (UROXATRAL) 10 MG 24 hr tablet, Take 10 mg by mouth daily., Disp: , Rfl:    amphetamine-dextroamphetamine (ADDERALL) 30 MG tablet, Take 1 tablet by mouth 2 (two) times daily. (Patient not taking: Reported on 09/13/2020), Disp: , Rfl:    aspirin 81 MG chewable tablet, Chew by mouth., Disp: , Rfl:    atorvastatin (LIPITOR) 80 MG tablet, Take by mouth., Disp: , Rfl:    diazepam (VALIUM) 10 MG tablet, Take 10 mg by mouth 3 (three) times daily., Disp: , Rfl:    fluvoxaMINE (LUVOX) 25 MG tablet, Take 25 mg by mouth 2 (two) times daily., Disp: , Rfl:    folic acid (FOLVITE) 1 MG tablet, Take 2 tablets by mouth daily. (Patient not taking: Reported on 09/13/2020), Disp: , Rfl:    gabapentin (NEURONTIN) 400 MG capsule, Take 400 mg by mouth 4 (four) times daily., Disp: , Rfl:    lidocaine (LIDODERM) 5 %, Place 1 patch onto the skin every 12 (twelve) hours. Remove & Discard patch within 12 hours or as directed by MD, Disp: 10 patch, Rfl: 0   methotrexate 250 MG/10ML injection, Inject into the skin., Disp: , Rfl:    metoprolol succinate (TOPROL-XL) 50 MG 24 hr tablet, Take by mouth., Disp: , Rfl:    nicotine (NICODERM CQ - DOSED IN MG/24 HOURS) 21 mg/24hr patch, Place onto the skin., Disp: , Rfl:    nitroGLYCERIN (NITROSTAT) 0.4 MG SL tablet, Place under the tongue., Disp: , Rfl:    olmesartan (BENICAR) 40 MG  tablet, Take by mouth., Disp: , Rfl:    oxyCODONE (OXY IR/ROXICODONE) 5 MG immediate release tablet, TAKE 1 TABLET (5 MG TOTAL) BY MOUTH EVERY FOUR (4) HOURS AS NEEDED FOR PAIN FOR UP TO 5 DAYS., Disp: , Rfl:    prazosin (MINIPRESS) 2 MG capsule, Take 2-4 mg by mouth at bedtime. , Disp: , Rfl:    predniSONE (DELTASONE) 20 MG tablet, Take 20 mg by mouth 3 (three) times daily. , Disp: , Rfl:    promethazine (PHENERGAN) 25 MG tablet, Take 25 mg by mouth every 6 (six) hours as needed. (Patient not taking: Reported on 09/13/2020), Disp: , Rfl:    ticagrelor (BRILINTA) 90 MG TABS tablet, Take by mouth., Disp: , Rfl:    triamcinolone ointment (KENALOG) 0.1 %, APPLY TO AFFECTED AREA TWICE A DAY, Disp: , Rfl:    VENTOLIN HFA 108 (90 Base) MCG/ACT inhaler, Inhale 1-2 puffs into the lungs every 4 (four) hours as needed. , Disp: , Rfl:   Past Medical History: Past Medical History:  Diagnosis Date   Anxiety    Arthritis    Depression    GERD (gastroesophageal reflux disease)    Hypertension    Kidney stones    Migraine headache    Obesity    Psoriatic arthritis [facility])    Dermatologist - Dr. Adolphus Birchwood, Rheumatologist - Dr. Gavin Potters   Rheumatoid arthritis [facility])  Tobacco Use: Social History   Tobacco Use  Smoking Status Former Smoker   Years: 15.00   Types: Cigarettes   Quit date: 07/27/2020   Years since quitting: 0.1  Smokeless Tobacco Never Used    Labs: Recent Review Flowsheet Data   There is no flowsheet data to display.      Exercise Target Goals: Exercise Program Goal: Individual exercise prescription set using results from initial 6 min walk test and THRR while considering  patients activity barriers and safety.   Exercise Prescription Goal: Initial exercise prescription builds to 30-45 minutes a day of aerobic activity, 2-3 days per week.  Home exercise guidelines will be given to patient during program as part of exercise prescription that the  participant will acknowledge.   Education: Aerobic Exercise: - Group verbal and visual presentation on the components of exercise prescription. Introduces F.I.T.T principle from ACSM for exercise prescriptions.  Reviews F.I.T.T. principles of aerobic exercise including progression. Written material given at graduation. Flowsheet Row Cardiac Rehab from 09/16/2020 in [facility] Cardiac and Pulmonary Rehab  Date 09/16/20  Educator [facility]  Instruction Review Code 1- Verbalizes Understanding      Education: Resistance Exercise: - Group verbal and visual presentation on the components of exercise prescription. Introduces F.I.T.T principle from ACSM for exercise prescriptions  Reviews F.I.T.T. principles of resistance exercise including progression. Written material given at graduation.    Education: Exercise & Equipment Safety: - Individual verbal instruction and demonstration of equipment use and safety with use of the equipment. Flowsheet Row Cardiac Rehab from 09/16/2020 in [facility] Cardiac and Pulmonary Rehab  Date 09/16/20  Educator [facility]  Instruction Review Code 1- Verbalizes Understanding      Education: Exercise Physiology & General Exercise Guidelines: - Group verbal and written instruction with models to review the exercise physiology of the cardiovascular system and associated critical values. Provides general exercise guidelines with specific guidelines to those with heart or lung disease.    Education: Flexibility, Balance, Mind/Body Relaxation: - Group verbal and visual presentation with interactive activity on the components of exercise prescription. Introduces F.I.T.T principle from ACSM for exercise prescriptions. Reviews F.I.T.T. principles of flexibility and balance exercise training including progression. Also discusses the mind body connection.  Reviews various relaxation techniques to help reduce and manage stress (i.e. Deep breathing, progressive muscle relaxation, and visualization).  Balance handout provided to take home. Written material given at graduation.   Activity Barriers & Risk Stratification:  Activity Barriers & Cardiac Risk Stratification - 09/16/20 1456      Activity Barriers & Cardiac Risk Stratification   Activity Barriers Arthritis;Joint Problems;Back Problems;Deconditioning;Muscular Weakness;Shortness of Breath    Cardiac Risk Stratification High           6 Minute Walk:  6 Minute Walk    Row Name 09/16/20 1455         6 Minute Walk   Phase Initial     Distance 1385 feet     Walk Time 6 minutes     # of Rest Breaks 0     MPH 2.62     METS 4.54     RPE 7     Perceived Dyspnea  1     VO2 Peak 15.9     Symptoms Yes (comment)     Comments SOB     Resting HR 82 bpm     Resting BP 124/72     Resting Oxygen Saturation  95 %     Exercise Oxygen Saturation  during 6  min walk 97 %     Max Ex. HR 112 bpm     Max Ex. BP 166/64     2 Minute Post BP 110/60            Oxygen Initial Assessment:   Oxygen Re-Evaluation:   Oxygen Discharge (Final Oxygen Re-Evaluation):   Initial Exercise Prescription:  Initial Exercise Prescription - 09/16/20 1400      Date of Initial Exercise RX and Referring Provider   Date 09/16/20    Referring Provider Christella Noa MD      Treadmill   MPH 2.8    Grade 2    Minutes 15    METs 3.81      NuStep   Level 3    SPM 80    Minutes 15    METs 3      REL-XR   Level 3    Speed 50    Minutes 15    METs 3      Prescription Details   Frequency (times per week) 3    Duration Progress to 30 minutes of continuous aerobic without signs/symptoms of physical distress      Intensity   THRR 40-80% of Max Heartrate 122-161    Ratings of Perceived Exertion 11-13    Perceived Dyspnea 0-4      Progression   Progression Continue to progress workloads to maintain intensity without signs/symptoms of physical distress.      Resistance Training   Training Prescription Yes    Weight 4 lb    Reps  10-15           Perform Capillary Blood Glucose checks as needed.  Exercise Prescription Changes:  Exercise Prescription Changes    Row Name 09/16/20 1400             Response to Exercise   Blood Pressure (Admit) 124/72       Blood Pressure (Exercise) 166/64       Blood Pressure (Exit) 110/60       Heart Rate (Admit) 82 bpm       Heart Rate (Exercise) 112 bpm       Heart Rate (Exit) 87 bpm       Oxygen Saturation (Admit) 95 %       Oxygen Saturation (Exercise) 97 %       Rating of Perceived Exertion (Exercise) 7       Perceived Dyspnea (Exercise) 1       Symptoms SOB       Comments walk test results              Exercise Comments:   Exercise Goals and Review:  Exercise Goals    Row Name 09/16/20 1500             Exercise Goals   Increase Physical Activity Yes       Intervention Provide advice, education, support and counseling about physical activity/exercise needs.;Develop an individualized exercise prescription for aerobic and resistive training based on initial evaluation findings, risk stratification, comorbidities and participant's personal goals.       Expected Outcomes Short Term: Attend rehab on a regular basis to increase amount of physical activity.;Long Term: Add in home exercise to make exercise part of routine and to increase amount of physical activity.;Long Term: Exercising regularly at least 3-5 days a week.       Increase Strength and Stamina Yes       Intervention Provide advice, education, support and counseling  about physical activity/exercise needs.;Develop an individualized exercise prescription for aerobic and resistive training based on initial evaluation findings, risk stratification, comorbidities and participant's personal goals.       Expected Outcomes Short Term: Increase workloads from initial exercise prescription for resistance, speed, and METs.;Short Term: Perform resistance training exercises routinely during rehab and add in  resistance training at home;Long Term: Improve cardiorespiratory fitness, muscular endurance and strength as measured by increased METs and functional capacity ( )       Able to understand and use rate of perceived exertion (RPE) scale Yes       Intervention Provide education and explanation on how to use RPE scale       Expected Outcomes Short Term: Able to use RPE daily in rehab to express subjective intensity level;Long Term:  Able to use RPE to guide intensity level when exercising independently       Able to understand and use Dyspnea scale Yes       Intervention Provide education and explanation on how to use Dyspnea scale       Expected Outcomes Short Term: Able to use Dyspnea scale daily in rehab to express subjective sense of shortness of breath during exertion;Long Term: Able to use Dyspnea scale to guide intensity level when exercising independently       Knowledge and understanding of Target Heart Rate Range (THRR) Yes       Intervention Provide education and explanation of THRR including how the numbers were predicted and where they are located for reference       Expected Outcomes Short Term: Able to state/look up THRR;Short Term: Able to use daily as guideline for intensity in rehab;Long Term: Able to use THRR to govern intensity when exercising independently       Able to check pulse independently Yes       Intervention Provide education and demonstration on how to check pulse in carotid and radial arteries.;Review the importance of being able to check your own pulse for safety during independent exercise       Expected Outcomes Short Term: Able to explain why pulse checking is important during independent exercise;Long Term: Able to check pulse independently and accurately       Understanding of Exercise Prescription Yes       Intervention Provide education, explanation, and written materials on patient's individual exercise prescription       Expected Outcomes Short Term: Able to  explain program exercise prescription;Long Term: Able to explain home exercise prescription to exercise independently              Exercise Goals Re-Evaluation :   Discharge Exercise Prescription (Final Exercise Prescription Changes):  Exercise Prescription Changes - 09/16/20 1400      Response to Exercise   Blood Pressure (Admit) 124/72    Blood Pressure (Exercise) 166/64    Blood Pressure (Exit) 110/60    Heart Rate (Admit) 82 bpm    Heart Rate (Exercise) 112 bpm    Heart Rate (Exit) 87 bpm    Oxygen Saturation (Admit) 95 %    Oxygen Saturation (Exercise) 97 %    Rating of Perceived Exertion (Exercise) 7    Perceived Dyspnea (Exercise) 1    Symptoms SOB    Comments walk test results           Nutrition:  Target Goals: Understanding of nutrition guidelines, daily intake of sodium 1500mg , cholesterol 200mg , calories 30% from fat and 7% or less from saturated fats, daily to  have 5 or more servings of fruits and vegetables.  Education: All About Nutrition: -Group instruction provided by verbal, written material, interactive activities, discussions, models, and posters to present general guidelines for heart healthy nutrition including fat, fiber, MyPlate, the role of sodium in heart healthy nutrition, utilization of the nutrition label, and utilization of this knowledge for meal planning. Follow up email sent as well. Written material given at graduation. Flowsheet Row Cardiac Rehab from 09/16/2020 in [facility] Cardiac and Pulmonary Rehab  Education need identified 09/16/20      Biometrics:  Pre Biometrics - 09/16/20 1501      Pre Biometrics   Height 5' 2.75" (1.594 m)    Weight 211 lb 8 oz (95.9 kg)    BMI (Calculated) 37.76    Single Leg Stand 30 seconds            Nutrition Therapy Plan and Nutrition Goals:  Nutrition Therapy & Goals - 09/16/20 1003      Nutrition Therapy   Diet Heart healthy, low Na    Drug/Food Interactions Statins/Certain Fruits    Protein  (specify units) 75g    Fiber 25 grams    Whole Grain Foods 3 servings    Saturated Fats 12 max. grams    Fruits and Vegetables 8 servings/day    Sodium 1.5 grams      Personal Nutrition Goals   Nutrition Goal ST: continue with current chnages - monitoring any barriers as she goes. LT: Continue with current changes    Comments B: cheerios with 2% milk, smoothies with yogurt and chia seeds L: tuna and crackers, salad, celery and cream cheese - loves all raw vegetables and fruit D: Salads (chicken or fish): if she makes pasta its with vegetable noodles, ground [place], and low Na sauce. She pays very close attention to her salt. She likes to have nuts and seeds and berries. She likes whole grain products like whole wheat pasta and oatmeal. She has mad a lot of changes since her heart attack. Discussed heart healthy eating and low Na eating.      Intervention Plan   Intervention Prescribe, educate and counsel regarding individualized specific dietary modifications aiming towards targeted core components such as weight, hypertension, lipid management, diabetes, heart failure and other comorbidities.;Nutrition handout(s) given to patient.    Expected Outcomes Short Term Goal: Understand basic principles of dietary content, such as calories, fat, sodium, cholesterol and nutrients.;Short Term Goal: A plan has been developed with personal nutrition goals set during dietitian appointment.;Long Term Goal: Adherence to prescribed nutrition plan.           Nutrition Assessments:  MEDIFICTS Score Key:  ?70 Need to make dietary changes   40-70 Heart Healthy Diet  ? 40 Therapeutic Level Cholesterol Diet  Flowsheet Row Cardiac Rehab from 09/16/2020 in [facility] Cardiac and Pulmonary Rehab  Picture Your Plate Total Score on Admission 83     Picture Your Plate Scores:  <16 Unhealthy dietary pattern with much room for improvement.  41-50 Dietary pattern unlikely to meet recommendations for good health and  room for improvement.  51-60 More healthful dietary pattern, with some room for improvement.   >60 Healthy dietary pattern, although there may be some specific behaviors that could be improved.    Nutrition Goals Re-Evaluation:   Nutrition Goals Discharge (Final Nutrition Goals Re-Evaluation):   Psychosocial: Target Goals: Acknowledge presence or absence of significant depression and/or stress, maximize coping skills, provide positive support system. Participant is able to verbalize types  and ability to use techniques and skills needed for reducing stress and depression.   Education: Stress, Anxiety, and Depression - Group verbal and visual presentation to define topics covered.  Reviews how body is impacted by stress, anxiety, and depression.  Also discusses healthy ways to reduce stress and to treat/manage anxiety and depression.  Written material given at graduation. Flowsheet Row Cardiac Rehab from 09/16/2020 in [facility] Cardiac and Pulmonary Rehab  Education need identified 09/16/20      Education: Sleep Hygiene -Provides group verbal and written instruction about how sleep can affect your health.  Define sleep hygiene, discuss sleep cycles and impact of sleep habits. Review good sleep hygiene tips.    Initial Review & Psychosocial Screening:  Initial Psych Review & Screening - 09/13/20 1513      Initial Review   Current issues with Current Sleep Concerns;Current Stress Concerns    Source of Stress Concerns Chronic Illness;Unable to perform yard/household activities;Unable to participate in former interests or hobbies      Family Dynamics   Good Support System? Yes   husband     Barriers   Psychosocial barriers to participate in program There are no identifiable barriers or psychosocial needs.;The patient should benefit from training in stress management and relaxation.      Screening Interventions   Interventions Encouraged to exercise;To provide support and resources with  identified psychosocial needs;Provide feedback about the scores to participant    Expected Outcomes Short Term goal: Utilizing psychosocial counselor, staff and physician to assist with identification of specific Stressors or current issues interfering with healing process. Setting desired goal for each stressor or current issue identified.;Long Term Goal: Stressors or current issues are controlled or eliminated.;Short Term goal: Identification and review with participant of any Quality of Life or Depression concerns found by scoring the questionnaire.;Long Term goal: The participant improves quality of Life and PHQ9 Scores as seen by post scores and/or verbalization of changes           Quality of Life Scores:   Quality of Life - 09/16/20 1501      Quality of Life   Select Quality of Life      Quality of Life Scores   Health/Function Pre 25.43 %    Socioeconomic Pre 28.5 %    Psych/Spiritual Pre 28.29 %    Family Pre 25.2 %    GLOBAL Pre 26.62 %          Scores of 19 and below usually indicate a poorer quality of life in these areas.  A difference of  2-3 points is a clinically meaningful difference.  A difference of 2-3 points in the total score of the Quality of Life Index has been associated with significant improvement in overall quality of life, self-image, physical symptoms, and general health in studies assessing change in quality of life.  PHQ-9: Recent Review Flowsheet Data    Depression screen [facility] 2/9 09/16/2020 02/12/2016 03/08/2013   Decreased Interest 0 0 0   Down, Depressed, Hopeless 0 0 0   PHQ - 2 Score 0 0 0   Altered sleeping 0 - -   Tired, decreased energy 3 - -   Change in appetite 1 - -   Feeling bad or failure about yourself  0 - -   Trouble concentrating 0 - -   Moving slowly or fidgety/restless 0 - -   Suicidal thoughts 0 - -   PHQ-9 Score 4 - -   Difficult doing work/chores Not  difficult at all - -     Interpretation of Total Score  Total Score  Depression Severity:  1-4 = Minimal depression, 5-9 = Mild depression, 10-14 = Moderate depression, 15-19 = Moderately severe depression, 20-27 = Severe depression   Psychosocial Evaluation and Intervention:  Psychosocial Evaluation - 09/13/20 1529      Psychosocial Evaluation & Interventions   Interventions Encouraged to exercise with the program and follow exercise prescription;Stress management education;Relaxation education    Comments Greysen reports doing "okay" since MI. It definitely came as a surprise to her and her family so she is very motivated to make lifestyle changes to prevent further issues. She lives with her husband, 65 year old daughter, 12 year old daughter (until her military husband gets stationed somewhere) and her 35 year old nephew. She feels well supported by them and her other family. She has had psoriatic arthritis since she was about 40 years old, so chronic pain has been an issue for a long time. She feels like her medications are treating her symptoms well, but does have issues with sleep recently. Her MD gave her some medicine to sleep which is helping some. She is disabled and stays busy with her kids and animals. She quit smoking the day of her MI (07/27/20) and is using the patch which she states is going well. She does have a history of depression which is being managed well. A fear of exercising is present after her MI so she is hoping this program will help build her confidence.    Expected Outcomes Short: attend cardiac rehab for education and exercise. Long: develop and maintain positive self care habits    Continue Psychosocial Services  Follow up required by staff           Psychosocial Re-Evaluation:   Psychosocial Discharge (Final Psychosocial Re-Evaluation):   Vocational Rehabilitation: Provide vocational rehab assistance to qualifying candidates.   Vocational Rehab Evaluation & Intervention:  Vocational Rehab - 09/13/20 1513      Initial  Vocational Rehab Evaluation & Intervention   Assessment shows need for Vocational Rehabilitation No           Education: Education Goals: Education classes will be provided on a variety of topics geared toward better understanding of heart health and risk factor modification. Participant will state understanding/return demonstration of topics presented as noted by education test scores.  Learning Barriers/Preferences:  Learning Barriers/Preferences - 09/13/20 1512      Learning Barriers/Preferences   Learning Barriers None    Learning Preferences None           General Cardiac Education Topics:  AED/CPR: - Group verbal and written instruction with the use of models to demonstrate the basic use of the AED with the basic ABC's of resuscitation.   Anatomy and Cardiac Procedures: - Group verbal and visual presentation and models provide information about basic cardiac anatomy and function. Reviews the testing methods done to diagnose heart disease and the outcomes of the test results. Describes the treatment choices: Medical Management, Angioplasty, or Coronary Bypass Surgery for treating various heart conditions including Myocardial Infarction, Angina, Valve Disease, and Cardiac Arrhythmias.  Written material given at graduation. Flowsheet Row Cardiac Rehab from 09/16/2020 in [facility] Cardiac and Pulmonary Rehab  Education need identified 09/16/20      Medication Safety: - Group verbal and visual instruction to review commonly prescribed medications for heart and lung disease. Reviews the medication, class of the drug, and side effects. Includes the steps to properly  store meds and maintain the prescription regimen.  Written material given at graduation.   Intimacy: - Group verbal instruction through game format to discuss how heart and lung disease can affect sexual intimacy. Written material given at graduation..   Know Your Numbers and Heart Failure: - Group verbal and visual  instruction to discuss disease risk factors for cardiac and pulmonary disease and treatment options.  Reviews associated critical values for Overweight/Obesity, Hypertension, Cholesterol, and Diabetes.  Discusses basics of heart failure: signs/symptoms and treatments.  Introduces Heart Failure Zone chart for action plan for heart failure.  Written material given at graduation. Flowsheet Row Cardiac Rehab from 09/16/2020 in [facility] Cardiac and Pulmonary Rehab  Education need identified 09/16/20      Infection Prevention: - Provides verbal and written material to individual with discussion of infection control including proper hand washing and proper equipment cleaning during exercise session. Flowsheet Row Cardiac Rehab from 09/16/2020 in [facility] Cardiac and Pulmonary Rehab  Date 09/16/20  Educator [facility]  Instruction Review Code 1- Verbalizes Understanding      Falls Prevention: - Provides verbal and written material to individual with discussion of falls prevention and safety. Flowsheet Row Cardiac Rehab from 09/16/2020 in [facility] Cardiac and Pulmonary Rehab  Date 09/16/20  Educator [facility]  Instruction Review Code 1- Verbalizes Understanding      Other: -Provides group and verbal instruction on various topics (see comments)   Knowledge Questionnaire Score:  Knowledge Questionnaire Score - 09/16/20 1502      Knowledge Questionnaire Score   Pre Score 18/26 Education Focus: MI, angina, heart failure, nurtrition, exercise, diabetes, depression           Core Components/Risk Factors/Patient Goals at Admission:  Personal Goals and Risk Factors at Admission - 09/16/20 1502      Core Components/Risk Factors/Patient Goals on Admission    Weight Management Weight Loss;Yes;Obesity    Intervention Weight Management: Develop a combined nutrition and exercise program designed to reach desired caloric intake, while maintaining appropriate intake of nutrient and fiber, sodium and fats, and appropriate energy  expenditure required for the weight goal.;Weight Management: Provide education and appropriate resources to help participant work on and attain dietary goals.;Weight Management/Obesity: Establish reasonable short term and long term weight goals.;Obesity: Provide education and appropriate resources to help participant work on and attain dietary goals.    Admit Weight 211 lb 8 oz (95.9 kg)    Goal Weight: Short Term 206 lb (93.4 kg)    Goal Weight: Long Term 200 lb (90.7 kg)    Expected Outcomes Short Term: Continue to assess and modify interventions until short term weight is achieved;Long Term: Adherence to nutrition and physical activity/exercise program aimed toward attainment of established weight goal;Weight Loss: Understanding of general recommendations for a balanced deficit meal plan, which promotes 1-2 lb weight loss per week and includes a negative energy balance of [phone] kcal/d;Understanding recommendations for meals to include 15-35% energy as protein, 25-35% energy from fat, 35-60% energy from carbohydrates, less than 200mg  of dietary cholesterol, 20-35 gm of total fiber daily;Understanding of distribution of calorie intake throughout the day with the consumption of 4-5 meals/snacks    Tobacco Cessation Yes    Number of packs per day Quit on 07/27/20 (Day of MI)- on patch    Intervention Offer self-teaching materials, assist with locating and accessing local/national Quit Smoking programs, and support quit date choice.;Assist the participant in steps to quit. Provide individualized education and counseling about committing to Tobacco Cessation, relapse prevention, and  pharmacological support that can be provided by physician.    Expected Outcomes Short Term: Will quit all tobacco product use, adhering to prevention of relapse plan.;Long Term: Complete abstinence from all tobacco products for at least 12 months from quit date.    Hypertension Yes    Intervention Provide education on lifestyle  modifcations including regular physical activity/exercise, weight management, moderate sodium restriction and increased consumption of fresh fruit, vegetables, and low fat dairy, alcohol moderation, and smoking cessation.;Monitor prescription use compliance.    Expected Outcomes Short Term: Continued assessment and intervention until BP is < 140/35mm HG in hypertensive participants. < 130/55mm HG in hypertensive participants with diabetes, heart failure or chronic kidney disease.;Long Term: Maintenance of blood pressure at goal levels.    Lipids Yes    Intervention Provide education and support for participant on nutrition & aerobic/resistive exercise along with prescribed medications to achieve LDL 70mg , HDL >40mg .    Expected Outcomes Short Term: Participant states understanding of desired cholesterol values and is compliant with medications prescribed. Participant is following exercise prescription and nutrition guidelines.;Long Term: Cholesterol controlled with medications as prescribed, with individualized exercise RX and with personalized nutrition plan. Value goals: LDL < , HDL > 40 mg.           Education:Diabetes - Individual verbal and written instruction to review signs/symptoms of diabetes, desired ranges of glucose level fasting, after meals and with exercise. Acknowledge that pre and post exercise glucose checks will be done for 3 sessions at entry of program.   Core Components/Risk Factors/Patient Goals Review:   Goals and Risk Factor Review    Row Name 09/16/20 1517             Core Components/Risk Factors/Patient Goals Review   Personal Goals Review Tobacco Cessation       Review Dniyah has recently quit tobacco use within the last 6 months. Intervention for relapse prevention was provided at the initial medical review. He was encouraged to continue to with tobacco cessation and was provided information on relapse prevention. Patient received information about combination  therapy, tobacco cessation classes, quit line, and quit smoking apps in case of a relapse. Patient demonstrated understanding of this material.Staff will continue to provide encouragement and follow up with the patient throughout the program.       Expected Outcomes Short: Continued cessation Long: Complete Cessation              Core Components/Risk Factors/Patient Goals at Discharge (Final Review):   Goals and Risk Factor Review - 09/16/20 1517      Core Components/Risk Factors/Patient Goals Review   Personal Goals Review Tobacco Cessation    Review Alanii has recently quit tobacco use within the last 6 months. Intervention for relapse prevention was provided at the initial medical review. He was encouraged to continue to with tobacco cessation and was provided information on relapse prevention. Patient received information about combination therapy, tobacco cessation classes, quit line, and quit smoking apps in case of a relapse. Patient demonstrated understanding of this material.Staff will continue to provide encouragement and follow up with the patient throughout the program.    Expected Outcomes Short: Continued cessation Long: Complete Cessation           ITP Comments:  ITP Comments    Row Name 09/13/20 1505 09/16/20 1459 09/25/20 0648 09/25/20 0805 10/02/20 0926   ITP Comments Initial telephone orientation completed. Documentation can be found in [facility] 12/25. EP orientation scheduled for Monday 2/14 at 9am.  Completed and gym orientation. Initial ITP created and sent for review to Dr. Bethann Punches, Medical Director. 30 Day review completed. Medical Director ITP review done, changes made as directed, and signed approval by Medical Director. Called to check on patient. No show since orientation.  Second call attempt. Iria has not shown up since orientation and not returned any call attempts.  We will discharge her at this time.          Comments: Discharge ITP, no show after  orientation

## 2020-10-04 ENCOUNTER — Ambulatory Visit: Payer: Medicare Other

## 2020-10-07 ENCOUNTER — Ambulatory Visit: Payer: Medicare Other

## 2020-10-09 ENCOUNTER — Ambulatory Visit: Payer: Medicare Other

## 2020-10-11 ENCOUNTER — Ambulatory Visit: Payer: Medicare Other

## 2020-10-14 ENCOUNTER — Ambulatory Visit: Payer: Medicare Other

## 2020-10-16 ENCOUNTER — Ambulatory Visit: Payer: Medicare Other

## 2020-10-18 ENCOUNTER — Ambulatory Visit: Payer: Medicare Other

## 2020-10-21 ENCOUNTER — Ambulatory Visit: Payer: Medicare Other

## 2020-10-23 ENCOUNTER — Ambulatory Visit: Payer: Medicare Other

## 2020-10-25 ENCOUNTER — Ambulatory Visit: Payer: Medicare Other

## 2020-10-28 ENCOUNTER — Ambulatory Visit: Payer: Medicare Other

## 2020-10-30 ENCOUNTER — Ambulatory Visit: Payer: Medicare Other

## 2020-11-01 ENCOUNTER — Ambulatory Visit: Payer: Medicare Other

## 2020-11-04 ENCOUNTER — Ambulatory Visit: Payer: Medicare Other

## 2020-11-06 ENCOUNTER — Ambulatory Visit: Payer: Medicare Other

## 2020-11-08 ENCOUNTER — Ambulatory Visit: Payer: Medicare Other

## 2020-11-11 ENCOUNTER — Other Ambulatory Visit: Payer: Self-pay

## 2020-11-11 ENCOUNTER — Emergency Department: Payer: Medicare Other

## 2020-11-11 ENCOUNTER — Ambulatory Visit: Payer: Medicare Other

## 2020-11-11 ENCOUNTER — Emergency Department
Admission: EM | Admit: 2020-11-11 | Discharge: 2020-11-12 | Disposition: A | Discharge status: Home / Self Care | Payer: Medicare Other | Attending: Emergency Medicine | Admitting: Emergency Medicine

## 2020-11-11 DIAGNOSIS — R079 Chest pain, unspecified: Secondary | ICD-10-CM | POA: Diagnosis not present

## 2020-11-11 DIAGNOSIS — Z79899 Other long term (current) drug therapy: Secondary | ICD-10-CM | POA: Diagnosis not present

## 2020-11-11 DIAGNOSIS — R0602 Shortness of breath: Secondary | ICD-10-CM | POA: Insufficient documentation

## 2020-11-11 DIAGNOSIS — Z87891 Personal history of nicotine dependence: Secondary | ICD-10-CM | POA: Insufficient documentation

## 2020-11-11 DIAGNOSIS — R42 Dizziness and giddiness: Secondary | ICD-10-CM | POA: Insufficient documentation

## 2020-11-11 DIAGNOSIS — Z91040 Latex allergy status: Secondary | ICD-10-CM | POA: Diagnosis not present

## 2020-11-11 DIAGNOSIS — I10 Essential (primary) hypertension: Secondary | ICD-10-CM | POA: Diagnosis not present

## 2020-11-11 DIAGNOSIS — Z7902 Long term (current) use of antithrombotics/antiplatelets: Secondary | ICD-10-CM | POA: Diagnosis not present

## 2020-11-11 DIAGNOSIS — Z7982 Long term (current) use of aspirin: Secondary | ICD-10-CM | POA: Insufficient documentation

## 2020-11-11 LAB — BASIC METABOLIC PANEL
Anion gap: 7 (ref 5–15)
BUN: 11 mg/dL (ref 6–20)
CO2: 23 mmol/L (ref 22–32)
Calcium: 9 mg/dL (ref 8.9–10.3)
Chloride: 108 mmol/L (ref 98–111)
Creatinine, Ser: 0.61 mg/dL (ref 0.44–1.00)
GFR, Estimated: >60 mL/min (ref >60)
Glucose, Bld: 118 mg/dL — ABNORMAL HIGH (ref 70–99)
Potassium: 3.6 mmol/L (ref 3.5–5.1)
Sodium: 138 mmol/L (ref 135–145)

## 2020-11-11 LAB — HEPATIC FUNCTION PANEL
ALT: 21 U/L (ref 0–44)
AST: 26 U/L (ref 15–41)
Albumin: 3.6 g/dL (ref 3.5–5.0)
Alkaline Phosphatase: 80 U/L (ref 38–126)
Bilirubin, Direct: <0.1 mg/dL (ref 0.0–0.2)
Total Bilirubin: 0.7 mg/dL (ref 0.3–1.2)
Total Protein: 7.3 g/dL (ref 6.5–8.1)

## 2020-11-11 LAB — D-DIMER, QUANTITATIVE: D-Dimer, Quant: 0.32 ug/mL-FEU (ref 0.00–0.50)

## 2020-11-11 LAB — CBC
HCT: 43 % (ref 36.0–46.0)
Hemoglobin: 14.6 g/dL (ref 12.0–15.0)
MCH: 33.9 pg (ref 26.0–34.0)
MCHC: 34 g/dL (ref 30.0–36.0)
MCV: 99.8 fL (ref 80.0–100.0)
Platelets: 362 10*3/uL (ref 150–400)
RBC: 4.31 MIL/uL (ref 3.87–5.11)
RDW: 12.7 % (ref 11.5–15.5)
WBC: 13.5 10*3/uL — ABNORMAL HIGH (ref 4.0–10.5)
nRBC: 0 % (ref 0.0–0.2)

## 2020-11-11 LAB — TROPONIN I (HIGH SENSITIVITY): Troponin I (High Sensitivity): 7 ng/L (ref <18)

## 2020-11-11 LAB — LIPASE, BLOOD: Lipase: 35 U/L (ref 11–51)

## 2020-11-11 MED ORDER — LORAZEPAM 2 MG/ML IJ SOLN
0.5000 mg | Freq: Once | INTRAMUSCULAR | Status: AC
  Administered 2020-11-11: 0.5 mg via INTRAVENOUS
  Filled 2020-11-11: qty 1

## 2020-11-11 NOTE — ED Provider Notes (Incomplete)
[facility] Emergency Department Provider Note   ____________________________________________   Event Date/Time   First MD Initiated Contact with Patient 11/11/20 2306     (approximate)  I have reviewed the triage vital signs and the nursing notes.   HISTORY  Chief Complaint Chest Pain    HPI Chelsea Nolan is a 40 y.o. female brought to the ED via EMS from home with a chief complaint of chest pain.  Patient has a history of MI status post stent, reports compliance with her medications.  Awoke yesterday morning around 0430 with left-sided chest tightness, went back to sleep and awoke around 7:30 AM with constant pain all day.  Took aspirin and nitroglycerin at home without relief of symptoms.        {**SYMPTOM/COMPLAINT  LOCATION (describe anatomically) DURATION (when did it start) TIMING (onset and pattern) SEVERITY (0-10, mild/moderate/severe) QUALITY (description of symptoms) CONTEXT (recent surgery, new meds, activity, etc.) MODIFYINGFACTORS (what makes it better/worse) ASSOCIATEDSYMPTOMS (pertinent positives and negatives)**} Past Medical History:  Diagnosis Date  . Anxiety   . Arthritis   . Depression   . GERD (gastroesophageal reflux disease)   . Hypertension   . Kidney stones   . Migraine headache   . Obesity   . Psoriatic arthritis [facility])    Dermatologist - Dr. Adolphus Birchwood, Rheumatologist - Dr. Gavin Potters  . Rheumatoid arthritis [facility])     Patient Active Problem List   Diagnosis Date Noted  . NSTEMI (non-ST elevated myocardial infarction) (HCC) 07/28/2020  . Acute respiratory failure with hypoxia (HCC) 07/12/2019  . Acute bronchitis 07/12/2019  . Sepsis (HCC) 07/12/2019  . 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 Mild episode of recurrent major depressive disorder (HCC) 09/12/2018  . Hyperlipidemia 03/18/2016  . Chest pain 02/05/2016  . Migraine with aura 01/16/2015  . Renal stones  05/17/2014  . PTSD (post-traumatic stress disorder) 04/02/2014  . Family history of ovarian cancer 11/15/2013  . Psoriatic arthritis (HCC) 01/26/2013  . Essential hypertension, benign 01/26/2013  . Obesity 01/26/2013    Past Surgical History:  Procedure Laterality Date  . ABDOMINAL HYSTERECTOMY    . CESAREAN SECTION     pre-eclampsia  . LITHOTRIPSY    . VAGINAL DELIVERY      Prior to Admission medications   Medication Sig Start Date End Date Taking? Authorizing Provider  aspirin 81 MG chewable tablet Chew 81 mg by mouth daily. 07/30/20 08/23/21 Yes [provider]  atorvastatin (LIPITOR) 80 MG tablet Take 80 mg by mouth daily. 07/29/20 08/23/21 Yes [provider]  clobetasol ointment (TEMOVATE) 0.05 % Apply 1 application topically 2 (two) times daily.   Yes [provider]  colchicine 0.6 MG tablet Take 0.6 mg by mouth daily.   Yes [provider]  diazepam (VALIUM) 10 MG tablet Take 5 mg by mouth 3 (three) times daily.   Yes [provider]  gabapentin (NEURONTIN) 400 MG capsule Take 400 mg by mouth 4 (four) times daily. 06/16/19  Yes [provider]  loratadine (CLARITIN) 10 MG tablet Take 10 mg by mouth daily.   Yes [provider]  metoprolol succinate (TOPROL-XL) 50 MG 24 hr tablet Take 100 mg by mouth 2 (two) times daily. 07/29/20 08/23/21 Yes [provider]  nitroGLYCERIN (NITROSTAT) 0.4 MG SL tablet Place under the tongue. 07/29/20  Yes [provider]  olmesartan (BENICAR) 40 MG tablet Take 40 mg by mouth daily. 02/01/20 08/23/21 Yes [provider]  oxyCODONE (  OXY IR/ROXICODONE) 5 MG immediate release tablet TAKE 1 TABLET (5 MG TOTAL) BY MOUTH EVERY FOUR (4) HOURS AS NEEDED FOR PAIN FOR UP TO 5 DAYS. 10/26/19  Yes [provider]  pantoprazole (PROTONIX) 40 MG tablet Take 40 mg by mouth daily.   Yes [provider]  ticagrelor (BRILINTA) 90 MG TABS tablet Take 90 mg by mouth  2 (two) times daily. 07/29/20 08/23/21 Yes [provider]  traZODone (DESYREL) 50 MG tablet Take 50 mg by mouth at bedtime.   Yes [provider]  varenicline (CHANTIX) 0.5 MG tablet Take 0.5 mg by mouth 2 (two) times daily.   Yes [provider]    Allergies Ketorolac, Meloxicam, Penicillins, Enbrel [etanercept], Flexeril [cyclobenzaprine], Prednisone, Sertraline, Topamax [topiramate], Amoxicillin, Latex, and Zofran [ondansetron hcl]  Family History  Problem Relation Age of Onset  . Diabetes Mother   . Hyperlipidemia Mother   . Hypertension Mother   . Arthritis Mother   . Lupus Mother   . Obesity Sister   . Cancer Maternal Grandmother        ovarian    Social History Social History   Tobacco Use  . Smoking status: Former Smoker    Years: 15.00    Types: Cigarettes    Quit date: 07/27/2020    Years since quitting: 0.2  . Smokeless tobacco: Never Used  Substance Use Topics  . Alcohol use: No  . Drug use: No    Review of Systems {** Revise as appropriate then delete this line - Documentation of 10 systems is required  **} Constitutional: No fever/chills Eyes: No visual changes. ENT: No sore throat. Cardiovascular: Denies chest pain. Respiratory: Denies shortness of breath. Gastrointestinal: No abdominal pain.  No nausea, no vomiting.  No diarrhea.  No constipation. Genitourinary: Negative for dysuria. Musculoskeletal: Negative for back pain. Skin: Negative for rash. Neurological: Negative for headaches, focal weakness or numbness. {**Psychiatric:  Endocrine:  Hematological/Lymphatic:  Allergic/Immunilogical: **}  ____________________________________________   PHYSICAL EXAM:  VITAL SIGNS: ED Triage Vitals  Enc Vitals Group     BP 11/11/20 2308 (!) 156/98     Pulse Rate 11/11/20 2308 82     Resp 11/11/20 2308 19     Temp 11/11/20 2308 98.6 F (37 C)     Temp Source 11/11/20 2308 Oral     SpO2 11/11/20 2308 97 %     Weight  11/11/20 2309 194 lb (88 kg)     Height 11/11/20 2309 5\' 2"  (1.575 m)     Head Circumference --      Peak Flow --      Pain Score 11/11/20 2309 6     Pain Loc --      Pain Edu? --      Excl. in GC? --    {** Revise as appropriate then delete this line - 8 systems required **} Constitutional: Alert and oriented. Well appearing and in no acute distress. Eyes: Conjunctivae are normal. PERRL. EOMI. Head: Atraumatic. Nose: No congestion/rhinnorhea. Mouth/Throat: Mucous membranes are moist.  Oropharynx non-erythematous. Neck: No stridor.  {**No cervical spine tenderness to palpation.**} {**Hematological/Lymphatic/Immunilogical: No cervical lymphadenopathy. **}Cardiovascular: Normal rate, regular rhythm. Grossly normal heart sounds.  Good peripheral circulation. Respiratory: Normal respiratory effort.  No retractions. Lungs CTAB. Gastrointestinal: Soft and nontender. No distention. No abdominal bruits. No CVA tenderness. {**Genitourinary:  **}Musculoskeletal: No lower extremity tenderness nor edema.  No joint effusions. Neurologic:  Normal speech and language. No gross focal neurologic deficits are appreciated. No gait  instability. Skin:  Skin is warm, dry and intact. No rash noted. Psychiatric: Mood and affect are normal. Speech and behavior are normal.  ____________________________________________   LABS (all labs ordered are listed, but only abnormal results are displayed)  Labs Reviewed  BASIC METABOLIC PANEL - Abnormal; Notable for the following components:      Result Value   Glucose, Bld 118 (*)    All other components within normal limits  CBC - Abnormal; Notable for the following components:   WBC 13.5 (*)    All other components within normal limits  HEPATIC FUNCTION PANEL  LIPASE, BLOOD  D-DIMER, QUANTITATIVE  POC URINE PREG, ED  TROPONIN I (HIGH SENSITIVITY)    ____________________________________________  EKG  *** ____________________________________________  RADIOLOGY I, SUNG,JADE J, personally viewed and evaluated these images (plain radiographs) as part of my medical decision making, as well as reviewing the written report by the radiologist.  ED MD interpretation:  ***  Official radiology report(s): DG Chest Portable 1 View  Result Date: 11/11/2020 CLINICAL DATA:  Chest pain EXAM: PORTABLE CHEST 1 VIEW COMPARISON:  07/12/2019 FINDINGS: No focal consolidation or effusion. Mild bronchitic changes. Normal cardiomediastinal silhouette. No pneumothorax. IMPRESSION: Mild bronchitic changes. No focal pulmonary infiltrate. Electronically Signed   By: Jasmine Pang M.D.   On: 11/11/2020 23:25    ____________________________________________   PROCEDURES  Procedure(s) performed (including Critical Care):  Procedures   ____________________________________________   INITIAL IMPRESSION / ASSESSMENT AND PLAN / ED COURSE  As part of my medical decision making, I reviewed the following data within the electronic MEDICAL RECORD NUMBER {[id number]::"Notes from prior ED visits","[place] Controlled Substance Database"}        ***  Clinical Course as of 11/12/20 0002  Mon Nov 11, 2020  2359 Patient resting in no acute distress.  Updated patient and spouse on test results thus far.  Awaiting repeat troponin. [JS]    Clinical Course User Index [JS] Irean Hong, MD     ____________________________________________   FINAL CLINICAL IMPRESSION(S) / ED DIAGNOSES  Final diagnoses:  None     ED Discharge Orders    None      *Please note:  Chelsea Nolan was evaluated in Emergency Department on 11/12/2020 for the symptoms described in the history of present illness. She was evaluated in the context of the global COVID-19 pandemic, which necessitated consideration that the patient might be at risk for infection with the SARS-CoV-2 virus that  causes COVID-19. Institutional protocols and algorithms that pertain to the evaluation of patients at risk for COVID-19 are in a state of rapid change based on information released by regulatory bodies including the CDC and federal and state organizations. These policies and algorithms were followed during the patient's care in the ED.  Some ED evaluations and interventions may be delayed as a result of limited staffing during and the pandemic.*   Note:  This document was prepared using Dragon voice recognition software and may include unintentional dictation errors.

## 2020-11-11 NOTE — ED Triage Notes (Signed)
Pt to ED via ems from home. Pt states she started having chest pain this morning at 0430, pt went back to sleep then woke up around 0730 and has had chest pain on and off all day. Pt has been taking asa and nitro at home with no relief. Pt has hx MI last December, p states pain is tightness in chest with sob and dizziness. Pt was given 324asa and 2 nitro spray by ems with no relief.

## 2020-11-11 NOTE — ED Provider Notes (Signed)
[facility] Emergency Department Provider Note   ____________________________________________   Event Date/Time   First MD Initiated Contact with Patient 11/11/20 2306     (approximate)  I have reviewed the triage vital signs and the nursing notes.   HISTORY  Chief Complaint Chest Pain    HPI Chelsea Nolan is a 40 y.o. female brought to the ED via EMS from home with a chief complaint of chest pain.  Patient has a history of MI status post stent, reports compliance with her medications.  Awoke yesterday morning around 0430 with left-sided chest tightness, went back to sleep and awoke around 7:30 AM with constant pain all day.  Took aspirin and nitroglycerin at home without relief of symptoms.  Symptoms associated with mild shortness of breath and dizziness.  Denies associated diaphoresis, palpitations, nausea/vomiting.  Nuys recent illness.  Denies fever, cough, abdominal pain, dysuria or diarrhea.  Denies recent travel, trauma or hormone use.  Denies COVID-19 exposure.     Past Medical History:  Diagnosis Date  . Anxiety   . Arthritis   . Depression   . GERD (gastroesophageal reflux disease)   . Hypertension   . Kidney stones   . Migraine headache   . Obesity   . Psoriatic arthritis [facility])    Dermatologist - Dr. Adolphus Birchwood, Rheumatologist - Dr. Gavin Potters  . Rheumatoid arthritis [facility])     Patient Active Problem List   Diagnosis Date Noted  . NSTEMI (non-ST elevated myocardial infarction) (HCC) 07/28/2020  . Acute respiratory failure with hypoxia (HCC) 07/12/2019  . Acute bronchitis 07/12/2019  . Sepsis (HCC) 07/12/2019  . 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 Mild episode of recurrent major depressive disorder (HCC) 09/12/2018  . Hyperlipidemia 03/18/2016  . Chest pain 02/05/2016  . Migraine with aura 01/16/2015  . Renal stones 05/17/2014  . PTSD (post-traumatic stress disorder) 04/02/2014  .  Family history of ovarian cancer 11/15/2013  . Psoriatic arthritis (HCC) 01/26/2013  . Essential hypertension, benign 01/26/2013  . Obesity 01/26/2013    Past Surgical History:  Procedure Laterality Date  . ABDOMINAL HYSTERECTOMY    . CESAREAN SECTION     pre-eclampsia  . LITHOTRIPSY    . VAGINAL DELIVERY      Prior to Admission medications   Medication Sig Start Date End Date Taking? Authorizing Provider  aspirin 81 MG chewable tablet Chew 81 mg by mouth daily. 07/30/20 08/23/21 Yes [provider]  atorvastatin (LIPITOR) 80 MG tablet Take 80 mg by mouth daily. 07/29/20 08/23/21 Yes [provider]  clobetasol ointment (TEMOVATE) 0.05 % Apply 1 application topically 2 (two) times daily.   Yes [provider]  colchicine 0.6 MG tablet Take 0.6 mg by mouth daily.   Yes [provider]  diazepam (VALIUM) 10 MG tablet Take 5 mg by mouth 3 (three) times daily.   Yes [provider]  gabapentin (NEURONTIN) 400 MG capsule Take 400 mg by mouth 4 (four) times daily. 06/16/19  Yes [provider]  loratadine (CLARITIN) 10 MG tablet Take 10 mg by mouth daily.   Yes [provider]  metoprolol succinate (TOPROL-XL) 50 MG 24 hr tablet Take 100 mg by mouth 2 (two) times daily. 07/29/20 08/23/21 Yes [provider]  nitroGLYCERIN (NITROSTAT) 0.4 MG SL tablet Place under the tongue. 07/29/20  Yes [provider]  olmesartan (BENICAR) 40 MG tablet Take 40 mg by mouth daily. 02/01/20 08/23/21 Yes [provider]  oxyCODONE (  OXY IR/ROXICODONE) 5 MG immediate release tablet TAKE 1 TABLET (5 MG TOTAL) BY MOUTH EVERY FOUR (4) HOURS AS NEEDED FOR PAIN FOR UP TO 5 DAYS. 10/26/19  Yes [provider]  pantoprazole (PROTONIX) 40 MG tablet Take 40 mg by mouth daily.   Yes [provider]  ticagrelor (BRILINTA) 90 MG TABS tablet Take 90 mg by mouth 2 (two) times daily. 07/29/20 08/23/21 Yes [provider]   traZODone (DESYREL) 50 MG tablet Take 50 mg by mouth at bedtime.   Yes [provider]  varenicline (CHANTIX) 0.5 MG tablet Take 0.5 mg by mouth 2 (two) times daily.   Yes [provider]    Allergies Ketorolac, Meloxicam, Penicillins, Enbrel [etanercept], Flexeril [cyclobenzaprine], Prednisone, Sertraline, Topamax [topiramate], Amoxicillin, Latex, and Zofran [ondansetron hcl]  Family History  Problem Relation Age of Onset  . Diabetes Mother   . Hyperlipidemia Mother   . Hypertension Mother   . Arthritis Mother   . Lupus Mother   . Obesity Sister   . Cancer Maternal Grandmother        ovarian    Social History Social History   Tobacco Use  . Smoking status: Former Smoker    Years: 15.00    Types: Cigarettes    Quit date: 07/27/2020    Years since quitting: 0.2  . Smokeless tobacco: Never Used  Substance Use Topics  . Alcohol use: No  . Drug use: No    Review of Systems  Constitutional: No fever/chills Eyes: No visual changes. ENT: No sore throat. Cardiovascular: Positive for chest pain. Respiratory: Denies shortness of breath. Gastrointestinal: No abdominal pain.  No nausea, no vomiting.  No diarrhea.  No constipation. Genitourinary: Negative for dysuria. Musculoskeletal: Negative for back pain. Skin: Negative for rash. Neurological: Negative for headaches, focal weakness or numbness.   ____________________________________________   PHYSICAL EXAM:  VITAL SIGNS: ED Triage Vitals  Enc Vitals Group     BP 11/11/20 2308 (!) 156/98     Pulse Rate 11/11/20 2308 82     Resp 11/11/20 2308 19     Temp 11/11/20 2308 98.6 F (37 C)     Temp Source 11/11/20 2308 Oral     SpO2 11/11/20 2308 97 %     Weight 11/11/20 2309 194 lb (88 kg)     Height 11/11/20 2309 5\' 2"  (1.575 m)     Head Circumference --      Peak Flow --      Pain Score 11/11/20 2309 6     Pain Loc --      Pain Edu? --      Excl. in GC? --     Constitutional: Alert and  oriented. Well appearing and in no acute distress. Eyes: Conjunctivae are normal. PERRL. EOMI. Head: Atraumatic. Nose: No congestion/rhinnorhea. Mouth/Throat: Mucous membranes are moist.   Neck: No stridor.   Cardiovascular: Normal rate, regular rhythm. Grossly normal heart sounds.  Good peripheral circulation. Respiratory: Normal respiratory effort.  No retractions. Lungs CTAB. Gastrointestinal: Soft and nontender. No distention. No abdominal bruits. No CVA tenderness. Musculoskeletal: No lower extremity tenderness nor edema.  No joint effusions. Neurologic:  Normal speech and language. No gross focal neurologic deficits are appreciated. No gait instability. Skin:  Skin is warm, dry and intact. No rash noted. Psychiatric: Mood and affect are mildly anxious. Speech and behavior are normal.  ____________________________________________   LABS (all labs ordered are listed, but only abnormal results are displayed)  Labs Reviewed  BASIC METABOLIC  PANEL - Abnormal; Notable for the following components:      Result Value   Glucose, Bld 118 (*)    All other components within normal limits  CBC - Abnormal; Notable for the following components:   WBC 13.5 (*)    All other components within normal limits  HEPATIC FUNCTION PANEL  LIPASE, BLOOD  D-DIMER, QUANTITATIVE  POC URINE PREG, ED  TROPONIN I (HIGH SENSITIVITY)  TROPONIN I (HIGH SENSITIVITY)   ____________________________________________  EKG  ED ECG REPORT I, Bryan Omura J, the attending physician, personally viewed and interpreted this ECG.   Date: 11/12/2020  EKG Time: 2304  Rate: 82  Rhythm: normal EKG, normal sinus rhythm  Axis: Normal  Intervals:none  ST&T Change: Nonspecific  ____________________________________________  RADIOLOGY I, Vernis Cabacungan J, personally viewed and evaluated these images (plain radiographs) as part of my medical decision making, as well as reviewing the written report by the radiologist.  ED MD  interpretation: No acute cardiopulmonary process; mild bronchitic changes  Official radiology report(s): DG Chest Portable 1 View  Result Date: 11/11/2020 CLINICAL DATA:  Chest pain EXAM: PORTABLE CHEST 1 VIEW COMPARISON:  07/12/2019 FINDINGS: No focal consolidation or effusion. Mild bronchitic changes. Normal cardiomediastinal silhouette. No pneumothorax. IMPRESSION: Mild bronchitic changes. No focal pulmonary infiltrate. Electronically Signed   By: Jasmine Pang M.D.   On: 11/11/2020 23:25    ____________________________________________   PROCEDURES  Procedure(s) performed (including Critical Care):  Procedures   ____________________________________________   INITIAL IMPRESSION / ASSESSMENT AND PLAN / ED COURSE  As part of my medical decision making, I reviewed the following data within the electronic MEDICAL RECORD NUMBER Nursing notes reviewed and incorporated, Labs reviewed, EKG interpreted, Old chart reviewed, Radiograph reviewed and Notes from prior ED visits     40 year old female with history of MI presenting with chest pain. Differential diagnosis includes, but is not limited to, ACS, aortic dissection, pulmonary embolism, cardiac tamponade, pneumothorax, pneumonia, pericarditis, myocarditis, GI-related causes including esophagitis/gastritis, and musculoskeletal chest wall pain.    We will obtain cardiac panel, chest x-ray.  I have personally reviewed patient's cardiology visit from 10/23/2020 where there is a suggestion that anxiety may be playing a role in her recurrent chest pain.  Will administer low-dose IV Ativan.  Clinical Course as of 11/12/20 0442  Mon Nov 11, 2020  2359 Patient resting in no acute distress.  Updated patient and spouse on test results thus far.  Awaiting repeat troponin. [JS]  Tue Nov 12, 2020  0141 Patient improved after morphine.  Updated patient and spouse on all test results.  Will discharge home with limited quantity of Norco to use as needed.   Strict return precautions given.  Both verbalized understanding agree with plan of care [JS]    Clinical Course User Index [JS] Irean Hong, MD     ____________________________________________   FINAL CLINICAL IMPRESSION(S) / ED DIAGNOSES  Final diagnoses:  Nonspecific chest pain     ED Discharge Orders    None      *Please note:  Chelsea Nolan was evaluated in Emergency Department on 11/12/2020 for the symptoms described in the history of present illness. She was evaluated in the context of the global COVID-19 pandemic, which necessitated consideration that the patient might be at risk for infection with the SARS-CoV-2 virus that causes COVID-19. Institutional protocols and algorithms that pertain to the evaluation of patients at risk for COVID-19 are in a state of rapid change based on information released by regulatory bodies including  the [employer] and federal and state organizations. These policies and algorithms were followed during the patient's care in the ED.  Some ED evaluations and interventions may be delayed as a result of limited staffing during and the pandemic.*   Note:  This document was prepared using Dragon voice recognition software and may include unintentional dictation errors.   Irean Hong, MD 11/12/20 [phone]

## 2020-11-12 DIAGNOSIS — R079 Chest pain, unspecified: Secondary | ICD-10-CM | POA: Diagnosis not present

## 2020-11-12 LAB — TROPONIN I (HIGH SENSITIVITY): Troponin I (High Sensitivity): 7 ng/L (ref <18)

## 2020-11-12 MED ORDER — FAMOTIDINE IN NACL 20-0.9 MG/50ML-% IV SOLN
20.0000 mg | Freq: Once | INTRAVENOUS | Status: AC
  Administered 2020-11-12: 20 mg via INTRAVENOUS
  Filled 2020-11-12: qty 50

## 2020-11-12 MED ORDER — MORPHINE SULFATE (PF) 4 MG/ML IV SOLN
4.0000 mg | Freq: Once | INTRAVENOUS | Status: AC
  Administered 2020-11-12: 4 mg via INTRAVENOUS
  Filled 2020-11-12: qty 1

## 2020-11-12 NOTE — Discharge Instructions (Addendum)
You may take the Oxycodone already prescribed to you as needed for pain.  Apply moist heat to affected area several times daily.  Return to the ER for worsening symptoms, persistent vomiting, difficulty breathing or other concerns.

## 2020-11-13 ENCOUNTER — Ambulatory Visit: Payer: Medicare Other

## 2020-11-13 DIAGNOSIS — F419 Anxiety disorder, unspecified: Secondary | ICD-10-CM | POA: Diagnosis present

## 2020-11-15 ENCOUNTER — Ambulatory Visit: Payer: Medicare Other

## 2020-11-18 ENCOUNTER — Ambulatory Visit: Payer: Medicare Other

## 2020-11-20 ENCOUNTER — Ambulatory Visit: Payer: Medicare Other

## 2020-11-22 ENCOUNTER — Ambulatory Visit: Payer: Medicare Other

## 2020-11-25 ENCOUNTER — Ambulatory Visit: Payer: Medicare Other

## 2020-11-27 ENCOUNTER — Ambulatory Visit: Payer: Medicare Other

## 2020-11-29 ENCOUNTER — Ambulatory Visit: Payer: Medicare Other

## 2020-12-02 ENCOUNTER — Ambulatory Visit: Payer: Medicare Other

## 2020-12-04 ENCOUNTER — Ambulatory Visit: Payer: Medicare Other

## 2020-12-06 ENCOUNTER — Ambulatory Visit: Payer: Medicare Other

## 2020-12-09 ENCOUNTER — Ambulatory Visit: Payer: Medicare Other

## 2020-12-11 ENCOUNTER — Ambulatory Visit: Payer: Medicare Other

## 2020-12-13 ENCOUNTER — Ambulatory Visit: Payer: Medicare Other

## 2022-02-23 DIAGNOSIS — F444 Conversion disorder with motor symptom or deficit: Secondary | ICD-10-CM | POA: Diagnosis present

## 2022-08-13 ENCOUNTER — Emergency Department
Admission: EM | Admit: 2022-08-13 | Discharge: 2022-08-13 | Disposition: A | Discharge status: Home / Self Care | Payer: 59 | Attending: Emergency Medicine | Admitting: Emergency Medicine

## 2022-08-13 ENCOUNTER — Other Ambulatory Visit: Payer: Self-pay

## 2022-08-13 ENCOUNTER — Emergency Department: Payer: 59

## 2022-08-13 DIAGNOSIS — I10 Essential (primary) hypertension: Secondary | ICD-10-CM | POA: Diagnosis not present

## 2022-08-13 DIAGNOSIS — R059 Cough, unspecified: Secondary | ICD-10-CM | POA: Diagnosis not present

## 2022-08-13 DIAGNOSIS — R079 Chest pain, unspecified: Secondary | ICD-10-CM

## 2022-08-13 DIAGNOSIS — R0602 Shortness of breath: Secondary | ICD-10-CM | POA: Insufficient documentation

## 2022-08-13 LAB — COMPREHENSIVE METABOLIC PANEL
ALT: 29 U/L (ref 0–44)
AST: 30 U/L (ref 15–41)
Albumin: 3.8 g/dL (ref 3.5–5.0)
Alkaline Phosphatase: 97 U/L (ref 38–126)
Anion gap: 12 (ref 5–15)
BUN: 15 mg/dL (ref 6–20)
CO2: 22 mmol/L (ref 22–32)
Calcium: 8.6 mg/dL — ABNORMAL LOW (ref 8.9–10.3)
Chloride: 104 mmol/L (ref 98–111)
Creatinine, Ser: 0.61 mg/dL (ref 0.44–1.00)
GFR, Estimated: >60 mL/min (ref >60)
Glucose, Bld: 126 mg/dL — ABNORMAL HIGH (ref 70–99)
Potassium: 3.3 mmol/L — ABNORMAL LOW (ref 3.5–5.1)
Sodium: 138 mmol/L (ref 135–145)
Total Bilirubin: 0.5 mg/dL (ref 0.3–1.2)
Total Protein: 7.6 g/dL (ref 6.5–8.1)

## 2022-08-13 LAB — CBG MONITORING, ED: Glucose-Capillary: 133 mg/dL — ABNORMAL HIGH (ref 70–99)

## 2022-08-13 LAB — CBC
HCT: 45.3 % (ref 36.0–46.0)
Hemoglobin: 15 g/dL (ref 12.0–15.0)
MCH: 33.6 pg (ref 26.0–34.0)
MCHC: 33.1 g/dL (ref 30.0–36.0)
MCV: 101.6 fL — ABNORMAL HIGH (ref 80.0–100.0)
Platelets: 314 10*3/uL (ref 150–400)
RBC: 4.46 MIL/uL (ref 3.87–5.11)
RDW: 13.5 % (ref 11.5–15.5)
WBC: 14.2 10*3/uL — ABNORMAL HIGH (ref 4.0–10.5)
nRBC: 0 % (ref 0.0–0.2)

## 2022-08-13 LAB — TSH: TSH: 3.162 u[IU]/mL (ref 0.350–4.500)

## 2022-08-13 LAB — D-DIMER, QUANTITATIVE: D-Dimer, Quant: <0.27 ug/mL-FEU (ref 0.00–0.50)

## 2022-08-13 LAB — TROPONIN I (HIGH SENSITIVITY)
Troponin I (High Sensitivity): 5 ng/L (ref <18)
Troponin I (High Sensitivity): 5 ng/L (ref <18)

## 2022-08-13 LAB — T4, FREE: Free T4: 0.58 ng/dL — ABNORMAL LOW (ref 0.61–1.12)

## 2022-08-13 LAB — LIPASE, BLOOD: Lipase: 38 U/L (ref 11–51)

## 2022-08-13 MED ORDER — LIDOCAINE 5 % EX PTCH: 1.0000 | MEDICATED_PATCH | Freq: Two times a day (BID) | CUTANEOUS | 0 refills | Status: AC

## 2022-08-13 MED ORDER — OXYCODONE HCL 5 MG PO TABS
10.0000 mg | ORAL_TABLET | Freq: Once | ORAL | Status: AC
  Administered 2022-08-13: 10 mg via ORAL
  Filled 2022-08-13: qty 2

## 2022-08-13 MED ORDER — MORPHINE SULFATE (PF) 4 MG/ML IV SOLN
4.0000 mg | Freq: Once | INTRAVENOUS | Status: AC
  Administered 2022-08-13: 4 mg via INTRAVENOUS
  Filled 2022-08-13: qty 1

## 2022-08-13 MED ORDER — LORAZEPAM 1 MG PO TABS
1.0000 mg | ORAL_TABLET | Freq: Once | ORAL | Status: AC
  Administered 2022-08-13: 1 mg via ORAL
  Filled 2022-08-13: qty 1

## 2022-08-13 MED ORDER — ACETAMINOPHEN 500 MG PO TABS
1000.0000 mg | ORAL_TABLET | Freq: Once | ORAL | Status: AC
  Administered 2022-08-13: 1000 mg via ORAL
  Filled 2022-08-13: qty 2

## 2022-08-13 NOTE — ED Triage Notes (Signed)
First nurse note: Pt to ED via EMS from home c/o CP x30-45 min ago, sharp center radiating to left should and jaw, stent placement hx, 324 ASA and 4 nitro spray en route, 182/130, 110 HR, 98% RA, 138 CBG.

## 2022-08-13 NOTE — Discharge Instructions (Addendum)
Take the Suboxone that you were prescribed as soon as you are able to pick it up from the pharmacy.  Talk to your doctor about your slightly low thyroid levels and have them rechecked.  Thank you for choosing [place] for your health care today!  Please see your primary doctor this week for a follow up appointment.   Sometimes, in the early stages of certain disease courses it is difficult to detect in the emergency department evaluation -- so, it is important that you continue to monitor your symptoms and call your doctor right away or return to the emergency department if you develop any new or worsening symptoms.  Please go to the following website to schedule new (and existing) patient appointments:   [email or web address]  If you do not have a primary doctor try calling the following clinics to establish care:  If you have insurance:  [facility] [phone] [place] [address]   [facility]  [phone] [address] [place] [address]   If you do not have insurance:  [facility]  [phone] [address], [place] [address]   The following is another list of primary care offices in the area who are accepting new patients at this time.  Please reach out to one of them directly and let them know you would like to schedule an appointment to follow up on an Emergency Department visit, and/or to establish a new primary care provider (PCP).  There are likely other primary care clinics in the are who are accepting new patients, but this is an excellent place to start:  [place] physician: Dr Lavon Paganini [address] [place], [place] [address] [phone]  [facility] Lead Physician: Dr Steele Sizer [address], [place], [place] [address] [phone]  [place] Physician: Dr Park Liter [address] [place], [place] [address] [phone]  [facility] Lead Physician: Dr Dewaine Oats [place], [place], [place] [address] [phone]  [facility] Physician: Dr Halina Maidens [address] [place],  [address] [phone]   It was my pleasure to care for you today.   Hoover Brunette Jacelyn Grip, MD

## 2022-08-13 NOTE — ED Triage Notes (Signed)
Arrives EMS mid sternal chest pain beginning ~1 hour pta. Was eating dinner, didn't like the show went to bedroom when husband heard a loud crash and found her on floor.   Pt admits pain radiates to left shoulder.   Hx MI in 2021 with stent placed. Has seen cardiology the last several weeks and been informed labs were off but they are unsure why.   20g R ac.  3 baby aspirin taken prior to syncopal episode.  2 nitro sprays administered by paramedics en route.

## 2022-08-13 NOTE — ED Provider Notes (Signed)
[facility] Provider Note    Event Date/Time   First MD Initiated Contact with Patient 08/13/22 1945     (approximate)   History   Chest Pain   HPI  Chelsea Nolan is a 42 y.o. female   Past medical history of anxiety, depression, GERD, hypertension, NSTEMI, rheumatoid arthritis who presents to the emergency department with chest pain.  She was watching TV and had a regular day with her husband when she went to the bedroom husband heard a thud and found the patient on the ground and she was complaining of left-sided chest pain radiation to jaw and shoulder.  Some associated shortness of breath.  Sudden onset.  This is been in the context of a mild cough over the last week.  No fever.  No nausea or vomiting.  No abdominal pain or GU symptoms.    History was obtained via patient.  Husband is available as independent historian at bedside to corroborate information given above.  Reviewed external medical notes including cardiology note by Dr. Chryl Heck of cardiology at [facility] dated 07/10/2022 which noted NSTEMI in 2022 with normal cardiac catheterization.      Physical Exam   Triage Vital Signs: ED Triage Vitals  Enc Vitals Group     BP 08/13/22 1936 (!) 135/105     Pulse Rate 08/13/22 1936 (!) 124     Resp 08/13/22 1936 (!) 26     Temp 08/13/22 1936 98 F (36.7 C)     Temp src --      SpO2 08/13/22 1936 97 %     Weight 08/13/22 1933 196 lb (88.9 kg)     Height 08/13/22 1933 5\' 2"  (1.575 m)     Head Circumference --      Peak Flow --      Pain Score 08/13/22 1933 8     Pain Loc --      Pain Edu? --      Excl. in [place]? --     Most recent vital signs: Vitals:   08/13/22 1951 08/13/22 2100  BP: 123/83 (!) 133/101  Pulse: (!) 115 96  Resp: (!) 29 (!) 28  Temp:    SpO2: 94% 98%    General: Awake CV:  Good peripheral perfusion.  Resp:  Lungs clear to auscultation bilateral without wheezing or focality but she is breathing fast 26 times a minute.  No  hypoxemia. Abd:  No distention.  Other:  Appears anxious.  She is grabbing her left chest and is tachycardic 120.   ED Results / Procedures / Treatments   Labs (all labs ordered are listed, but only abnormal results are displayed) Labs Reviewed  CBC - Abnormal; Notable for the following components:      Result Value   WBC 14.2 (*)    MCV 101.6 (*)    All other components within normal limits  COMPREHENSIVE METABOLIC PANEL - Abnormal; Notable for the following components:   Potassium 3.3 (*)    Glucose, Bld 126 (*)    Calcium 8.6 (*)    All other components within normal limits  T4, FREE - Abnormal; Notable for the following components:   Free T4 0.58 (*)    All other components within normal limits  CBG MONITORING, ED - Abnormal; Notable for the following components:   Glucose-Capillary 133 (*)    All other components within normal limits  TSH  D-DIMER, QUANTITATIVE (NOT AT [facility])  LIPASE, BLOOD  TROPONIN I (  HIGH SENSITIVITY)  TROPONIN I (HIGH SENSITIVITY)     I reviewed labs and they are notable for white blood cell count 14.2.  EKG  ED ECG REPORT I, Lucillie Garfinkel, the attending physician, personally viewed and interpreted this ECG.   Date: 08/13/2022  EKG Time: 1952  Rate: 116  Rhythm: sinus tachy  Axis: nl  Intervals:none  ST&T Change: No acute ischemic changes    RADIOLOGY I independently reviewed and interpreted chest x-ray see no obvious focalities or pneumothorax   PROCEDURES:  Critical Care performed: No  Procedures   MEDICATIONS ORDERED IN ED: Medications  LORazepam (ATIVAN) tablet 1 mg (1 mg Oral Given 08/13/22 2030)  morphine (PF) 4 MG/ML injection 4 mg (4 mg Intravenous Given 08/13/22 2112)  acetaminophen (TYLENOL) tablet 1,000 mg (1,000 mg Oral Given 08/13/22 2112)  oxyCODONE (Oxy IR/ROXICODONE) immediate release tablet 10 mg (10 mg Oral Given 08/13/22 2303)    Consultants:  IMPRESSION / MDM / ASSESSMENT AND PLAN / ED COURSE  I reviewed the  triage vital signs and the nursing notes.                              Differential diagnosis includes, but is not limited to, ACS, PE, dissection, viral URI or pneumonia, dysrhythmia   The patient is on the cardiac monitor to evaluate for evidence of arrhythmia and/or significant heart rate changes.  MDM: She already got full dose of aspirin and some nitro prior to arrival.  Will check troponins, D-dimer, chest x-ray.    Patient now complaining of whole body pain.  It turns out that she has not been a chronic pain management patient that was recently stopped on all narcotics and transition to Suboxone but she has not been able to pick up her prescription from the pharmacy yet.  Fortunately her troponins are flat x 2 and her D-dimer is negative.  I doubt emergent cardiopulmonary pathology to explain her pain.  No malignant dysrhythmia on the EKG and electrolytes within normal limits, no focal neurologic deficits so I doubt there is an emergent cause for her syncopal event earlier today.  Her thyroid T4 was 3/100s of a point below the limits of normal so I doubt this was the cause of her syncope.  She was informed of this finding we will have her thyroid levels rechecked by her PMD.  Pain is minimal at this point, with above workup unremarkable she will be discharged with close PMD follow-up and return precautions.  Patient's presentation is most consistent with acute presentation with potential threat to life or bodily function.       FINAL CLINICAL IMPRESSION(S) / ED DIAGNOSES   Final diagnoses:  Chest pain, unspecified type     Rx / DC Orders   ED Discharge Orders     None        Note:  This document was prepared using Dragon voice recognition software and may include unintentional dictation errors.    Lucillie Garfinkel, MD 08/13/22 2308

## 2022-09-21 ENCOUNTER — Emergency Department: Payer: 59

## 2022-09-21 ENCOUNTER — Observation Stay
Admission: EM | Admit: 2022-09-21 | Discharge: 2022-09-22 | Disposition: A | Discharge status: Home / Self Care | Payer: 59 | Attending: Internal Medicine | Admitting: Internal Medicine

## 2022-09-21 DIAGNOSIS — F444 Conversion disorder with motor symptom or deficit: Secondary | ICD-10-CM | POA: Diagnosis present

## 2022-09-21 DIAGNOSIS — F900 Attention-deficit hyperactivity disorder, predominantly inattentive type: Secondary | ICD-10-CM | POA: Diagnosis present

## 2022-09-21 DIAGNOSIS — D72829 Elevated white blood cell count, unspecified: Secondary | ICD-10-CM | POA: Diagnosis not present

## 2022-09-21 DIAGNOSIS — R569 Unspecified convulsions: Secondary | ICD-10-CM | POA: Diagnosis present

## 2022-09-21 DIAGNOSIS — L4050 Arthropathic psoriasis, unspecified: Secondary | ICD-10-CM | POA: Diagnosis present

## 2022-09-21 DIAGNOSIS — F909 Attention-deficit hyperactivity disorder, unspecified type: Secondary | ICD-10-CM | POA: Diagnosis not present

## 2022-09-21 DIAGNOSIS — I2510 Atherosclerotic heart disease of native coronary artery without angina pectoris: Secondary | ICD-10-CM | POA: Diagnosis not present

## 2022-09-21 DIAGNOSIS — F419 Anxiety disorder, unspecified: Secondary | ICD-10-CM | POA: Diagnosis present

## 2022-09-21 DIAGNOSIS — I10 Essential (primary) hypertension: Secondary | ICD-10-CM | POA: Diagnosis present

## 2022-09-21 DIAGNOSIS — Z87891 Personal history of nicotine dependence: Secondary | ICD-10-CM | POA: Diagnosis not present

## 2022-09-21 DIAGNOSIS — Z79899 Other long term (current) drug therapy: Secondary | ICD-10-CM | POA: Diagnosis not present

## 2022-09-21 DIAGNOSIS — G43909 Migraine, unspecified, not intractable, without status migrainosus: Secondary | ICD-10-CM | POA: Diagnosis present

## 2022-09-21 DIAGNOSIS — J45909 Unspecified asthma, uncomplicated: Secondary | ICD-10-CM | POA: Insufficient documentation

## 2022-09-21 HISTORY — DX: Acute myocardial infarction, unspecified: I21.9

## 2022-09-21 LAB — CBC WITH DIFFERENTIAL/PLATELET
Abs Immature Granulocytes: 0.24 10*3/uL — ABNORMAL HIGH (ref 0.00–0.07)
Basophils Absolute: 0.1 10*3/uL (ref 0.0–0.1)
Basophils Relative: 1 %
Eosinophils Absolute: 0.3 10*3/uL (ref 0.0–0.5)
Eosinophils Relative: 2 %
HCT: 47.1 % — ABNORMAL HIGH (ref 36.0–46.0)
Hemoglobin: 15.9 g/dL — ABNORMAL HIGH (ref 12.0–15.0)
Immature Granulocytes: 1 %
Lymphocytes Relative: 33 %
Lymphs Abs: 5.5 10*3/uL — ABNORMAL HIGH (ref 0.7–4.0)
MCH: 33.3 pg (ref 26.0–34.0)
MCHC: 33.8 g/dL (ref 30.0–36.0)
MCV: 98.7 fL (ref 80.0–100.0)
Monocytes Absolute: 1.1 10*3/uL — ABNORMAL HIGH (ref 0.1–1.0)
Monocytes Relative: 7 %
Neutro Abs: 9.5 10*3/uL — ABNORMAL HIGH (ref 1.7–7.7)
Neutrophils Relative %: 56 %
Platelets: 330 10*3/uL (ref 150–400)
RBC: 4.77 MIL/uL (ref 3.87–5.11)
RDW: 13 % (ref 11.5–15.5)
Smear Review: NORMAL
WBC: 16.8 10*3/uL — ABNORMAL HIGH (ref 4.0–10.5)
nRBC: 0 % (ref 0.0–0.2)

## 2022-09-21 LAB — BASIC METABOLIC PANEL
Anion gap: 12 (ref 5–15)
BUN: 14 mg/dL (ref 6–20)
CO2: 22 mmol/L (ref 22–32)
Calcium: 9.7 mg/dL (ref 8.9–10.3)
Chloride: 101 mmol/L (ref 98–111)
Creatinine, Ser: 0.69 mg/dL (ref 0.44–1.00)
GFR, Estimated: >60 mL/min (ref >60)
Glucose, Bld: 109 mg/dL — ABNORMAL HIGH (ref 70–99)
Potassium: 3.7 mmol/L (ref 3.5–5.1)
Sodium: 135 mmol/L (ref 135–145)

## 2022-09-21 LAB — TROPONIN I (HIGH SENSITIVITY)
Troponin I (High Sensitivity): 5 ng/L (ref <18)
Troponin I (High Sensitivity): 5 ng/L (ref <18)

## 2022-09-21 MED ORDER — COLCHICINE 0.6 MG PO TABS
0.6000 mg | ORAL_TABLET | Freq: Every day | ORAL | Status: DC
  Administered 2022-09-22: 0.6 mg via ORAL
  Filled 2022-09-21: qty 1

## 2022-09-21 MED ORDER — ATORVASTATIN CALCIUM 80 MG PO TABS
80.0000 mg | ORAL_TABLET | Freq: Every day | ORAL | Status: DC
  Administered 2022-09-22: 80 mg via ORAL
  Filled 2022-09-21: qty 1

## 2022-09-21 MED ORDER — ENOXAPARIN SODIUM 60 MG/0.6ML IJ SOSY
0.5000 mg/kg | PREFILLED_SYRINGE | INTRAMUSCULAR | Status: DC
  Administered 2022-09-22: 45 mg via SUBCUTANEOUS
  Filled 2022-09-21: qty 0.6

## 2022-09-21 MED ORDER — ONDANSETRON HCL 4 MG PO TABS: 4.0000 mg | ORAL_TABLET | Freq: Four times a day (QID) | ORAL | Status: DC | PRN

## 2022-09-21 MED ORDER — ACETAMINOPHEN 650 MG RE SUPP: 650.0000 mg | RECTAL | Status: DC | PRN

## 2022-09-21 MED ORDER — METOPROLOL SUCCINATE ER 100 MG PO TB24
100.0000 mg | ORAL_TABLET | Freq: Two times a day (BID) | ORAL | Status: DC
  Administered 2022-09-22: 100 mg via ORAL
  Filled 2022-09-21: qty 1

## 2022-09-21 MED ORDER — ORAL CARE MOUTH RINSE
15.0000 mL | OROMUCOSAL | Status: DC
  Administered 2022-09-22 (×2): 15 mL via OROMUCOSAL
  Filled 2022-09-21 (×5): qty 15

## 2022-09-21 MED ORDER — OXYCODONE HCL 5 MG PO TABS
5.0000 mg | ORAL_TABLET | ORAL | Status: DC | PRN
  Administered 2022-09-22 (×4): 5 mg via ORAL
  Filled 2022-09-21 (×4): qty 1

## 2022-09-21 MED ORDER — GABAPENTIN 300 MG PO CAPS: 400.0000 mg | ORAL_CAPSULE | Freq: Four times a day (QID) | ORAL | Status: DC

## 2022-09-21 MED ORDER — ACETAMINOPHEN 325 MG PO TABS: 650.0000 mg | ORAL_TABLET | ORAL | Status: DC | PRN

## 2022-09-21 MED ORDER — ONDANSETRON HCL 4 MG/2ML IJ SOLN: 4.0000 mg | Freq: Four times a day (QID) | INTRAMUSCULAR | Status: DC | PRN

## 2022-09-21 MED ORDER — SODIUM CHLORIDE 0.9 % IV SOLN
75.0000 mL/h | INTRAVENOUS | Status: DC
  Administered 2022-09-22 (×2): 75 mL/h via INTRAVENOUS

## 2022-09-21 MED ORDER — PANTOPRAZOLE SODIUM 40 MG PO TBEC
40.0000 mg | DELAYED_RELEASE_TABLET | Freq: Every day | ORAL | Status: DC
  Administered 2022-09-22: 40 mg via ORAL
  Filled 2022-09-21: qty 1

## 2022-09-21 MED ORDER — LORAZEPAM 2 MG/ML IJ SOLN: 2.0000 mg | INTRAMUSCULAR | Status: DC | PRN

## 2022-09-21 MED ORDER — TRAZODONE HCL 50 MG PO TABS
50.0000 mg | ORAL_TABLET | Freq: Every day | ORAL | Status: DC
  Administered 2022-09-22: 50 mg via ORAL
  Filled 2022-09-21: qty 1

## 2022-09-21 MED ORDER — IRBESARTAN 150 MG PO TABS
300.0000 mg | ORAL_TABLET | Freq: Every day | ORAL | Status: DC
  Administered 2022-09-22: 300 mg via ORAL
  Filled 2022-09-21: qty 2

## 2022-09-21 MED ORDER — NITROGLYCERIN 0.4 MG SL SUBL: 0.4000 mg | SUBLINGUAL_TABLET | SUBLINGUAL | Status: DC | PRN

## 2022-09-21 MED ORDER — GABAPENTIN 400 MG PO CAPS
800.0000 mg | ORAL_CAPSULE | Freq: Every day | ORAL | Status: DC
  Administered 2022-09-22: 800 mg via ORAL
  Filled 2022-09-21: qty 2

## 2022-09-21 MED ORDER — ORAL CARE MOUTH RINSE: 15.0000 mL | OROMUCOSAL | Status: DC | PRN

## 2022-09-21 MED ORDER — GABAPENTIN 400 MG PO CAPS
400.0000 mg | ORAL_CAPSULE | Freq: Three times a day (TID) | ORAL | Status: DC
  Administered 2022-09-22 (×2): 400 mg via ORAL
  Filled 2022-09-21 (×2): qty 1

## 2022-09-21 NOTE — Progress Notes (Signed)
PHARMACIST - PHYSICIAN COMMUNICATION  CONCERNING:  Enoxaparin (Lovenox) for DVT Prophylaxis    RECOMMENDATION: Patient was prescribed enoxaprin 21m q24 hours for VTE prophylaxis.   Filed Weights   09/21/22 1913  Weight: 88.9 kg (196 lb)    Body mass index is 35.85 kg/m.  Estimated Creatinine Clearance: 95.8 mL/min (by C-G formula based on SCr of 0.69 mg/dL).   Based on CAvonpatient is candidate for enoxaparin 0.554mkg TBW SQ every 24 hours based on BMI being >30.  DESCRIPTION: [facility]  Patient is now receiving enoxaparin 0.5 mg/kg every 24 hours   NaRenda RollsPharmD, MBSouth Coast [facility] Center/19/2024 11:31 PM

## 2022-09-21 NOTE — ED Notes (Signed)
Pt to CT at this time.

## 2022-09-21 NOTE — Assessment & Plan Note (Signed)
Continue atorvastatin, metoprolol and nitroglycerin

## 2022-09-21 NOTE — ED Provider Notes (Signed)
[facility] Provider Note    Event Date/Time   First MD Initiated Contact with Patient 09/21/22 1911     (approximate)   History   Seizures   HPI  Chelsea Nolan is a 42 y.o. female past medical history significant for GERD, hypertension, recurrent kidney stones, psoriatic arthritis, presents to the emergency department following an episode of altered mental status concerning for possible seizure.  History is provided by the patient and her husband at bedside.  Patient was at a car show today and they were looking at some flashing lights that were on the outside of a car vehicle.  States that she then had an episode where she stared off, fell backwards and had diffuse shaking, another bystander and the husband helped laid her down to the ground.  Patient had full body shaking with tongue biting.  Denies any urinary incontinence.  Was confused and altered after this episode and took approximately 5 to 10 minutes to return back to her normal.  Multiple prior episodes of questionable seizure-like activity in the past and the last episode was in July of this past year at [facility].  Patient has had an EEG and an MRI done with no seizure-like activity in the past.  Does not currently on antiepileptic medications.  Has not been evaluated by neurology as an outpatient.     Physical Exam   Triage Vital Signs: ED Triage Vitals  Enc Vitals Group     BP 09/21/22 1912 (!) 137/93     Pulse Rate 09/21/22 1912 87     Resp 09/21/22 1912 18     Temp 09/21/22 1912 98.3 F (36.8 C)     Temp Source 09/21/22 1912 Oral     SpO2 09/21/22 1910 100 %     Weight 09/21/22 1913 196 lb (88.9 kg)     Height --      Head Circumference --      Peak Flow --      Pain Score 09/21/22 1912 6     Pain Loc --      Pain Edu? --      Excl. in [place]? --     Most recent vital signs: Vitals:   09/21/22 1952 09/21/22 2134  BP:  114/80  Pulse: 83   Resp: 19   Temp:    SpO2: 99%     Physical  Exam Constitutional:      Appearance: She is well-developed.  HENT:     Head: Atraumatic.  Eyes:     Conjunctiva/sclera: Conjunctivae normal.  Cardiovascular:     Rate and Rhythm: Regular rhythm.  Pulmonary:     Effort: No respiratory distress.  Abdominal:     General: There is no distension.  Musculoskeletal:        General: Normal range of motion.     Cervical back: Normal range of motion.  Skin:    General: Skin is warm.  Neurological:     Mental Status: She is alert. Mental status is at baseline.     Comments: Cranial nerves grossly intact.  5/5 strength bilateral upper and lower extremities.  Sensation intact in upper and lower extremities.  Normal gait.     IMPRESSION / MDM / ASSESSMENT AND PLAN / ED COURSE  I reviewed the triage vital signs and the nursing notes.  Differential diagnosis including syncope, seizure disorder, nonepileptic seizures, intracranial hemorrhage, dysrhythmia, ACS.  Clinical picture is not consistent with meningitis, no meningismus, altered mental  status, fever.  EKG  I, Nathaniel Man, the attending physician, personally viewed and interpreted this ECG.   Rate: Normal  Rhythm: Normal sinus  Axis: Normal  Intervals: Normal  ST&T Change: None  No tachycardic or bradycardic dysrhythmias while on cardiac telemetry.  RADIOLOGY I independently reviewed imaging, my interpretation of imaging: CT scan of the head without signs of intracranial hemorrhage.  LABS (all labs ordered are listed, but only abnormal results are displayed) Labs interpreted as -  Serial troponins are negative.  No significant electrolyte abnormalities.  Leukocytosis but no signs of an infectious process.  Labs Reviewed  CBC WITH DIFFERENTIAL/PLATELET - Abnormal; Notable for the following components:      Result Value   WBC 16.8 (*)    Hemoglobin 15.9 (*)    HCT 47.1 (*)    Neutro Abs 9.5 (*)    Lymphs Abs 5.5 (*)    Monocytes Absolute 1.1 (*)    Abs Immature  Granulocytes 0.24 (*)    All other components within normal limits  BASIC METABOLIC PANEL - Abnormal; Notable for the following components:   Glucose, Bld 109 (*)    All other components within normal limits  HIV ANTIBODY (ROUTINE TESTING W REFLEX)  TROPONIN I (HIGH SENSITIVITY)  TROPONIN I (HIGH SENSITIVITY)    TREATMENT    MDM  Consulted and discussed the patient's case with neurology on-call for teleneurology who evaluated the patient over the telemetry screen.  Recommended admission to the hospitalist overnight for further observation given concern for possible high risk seizure with prolonged seizure.  Did not recommend starting on antiepileptic medications at this time.  Spot EEG if able in the morning.  Consulted hospitalist for admission.     PROCEDURES:  Critical Care performed: No  Procedures  Patient's presentation is most consistent with acute presentation with potential threat to life or bodily function.   MEDICATIONS ORDERED IN ED: Medications  oxyCODONE (Oxy IR/ROXICODONE) immediate release tablet 5 mg (has no administration in time range)  colchicine tablet 0.6 mg (has no administration in time range)  atorvastatin (LIPITOR) tablet 80 mg (has no administration in time range)  metoprolol succinate (TOPROL-XL) 24 hr tablet 100 mg (has no administration in time range)  nitroGLYCERIN (NITROSTAT) SL tablet 0.4 mg (has no administration in time range)  irbesartan (AVAPRO) tablet 300 mg (has no administration in time range)  traZODone (DESYREL) tablet 50 mg (has no administration in time range)  pantoprazole (PROTONIX) EC tablet 40 mg (has no administration in time range)  Oral care mouth rinse (has no administration in time range)  Oral care mouth rinse (has no administration in time range)  0.9 %  sodium chloride infusion (has no administration in time range)  enoxaparin (LOVENOX) injection 45 mg (has no administration in time range)  LORazepam (ATIVAN) injection  2 mg (has no administration in time range)  acetaminophen (TYLENOL) tablet 650 mg (has no administration in time range)    Or  acetaminophen (TYLENOL) suppository 650 mg (has no administration in time range)  ondansetron (ZOFRAN) tablet 4 mg (has no administration in time range)    Or  ondansetron (ZOFRAN) injection 4 mg (has no administration in time range)  gabapentin (NEURONTIN) capsule 400 mg (has no administration in time range)    And  gabapentin (NEURONTIN) capsule 800 mg (has no administration in time range)    FINAL CLINICAL IMPRESSION(S) / ED DIAGNOSES   Final diagnoses:  Seizure-like activity ([place])     Rx / DC  Orders   ED Discharge Orders     None        Note:  This document was prepared using Dragon voice recognition software and may include unintentional dictation errors.   Nathaniel Man, MD 09/21/22 [phone]

## 2022-09-21 NOTE — ED Triage Notes (Signed)
BIB EMS/ witnessed 5-29mn sx/ pt is A&Ox 4/ pt does not remember episode/ hx if sx, no meds

## 2022-09-21 NOTE — Assessment & Plan Note (Signed)
-   Continue home meds °

## 2022-09-21 NOTE — Assessment & Plan Note (Signed)
Continue olmesartan, metoprolol.  Patient also recently started on clonidine

## 2022-09-21 NOTE — Consult Note (Signed)
[facility] TeleNeurology Consult Services  Stat Consult  Patient Name:   Chelsea, Nolan Date of Birth:   03/14/1981 Identification Number:   MRN - [id number] Date of Service:   09/21/2022 21:24:56  Diagnosis:       R56.9 - Seizures  Impression 42 y/o woman with suspected seizure tonight. Has a history of events that were thought to be nonepileptic spells, however her husband reports this episode was very different. Patients with epilepsy can also have PNES so I don't want to dismiss this episode out of hand. Since it was fairly prolonged, and if it was truly a seizure she is at risk of going into status epilepticus, I recommend admission for overnight observation for recurrent seizures. I don't see a need to repeat brain MRI since she had one last month and head CT today was normal, but if EEG is readily available would consider getting a routine spot EEG tomorrow.    Dispositions : Neurology will follow   ----------------------------------------------------------------------------------------------------    Metrics: [facility] Notification Time: 09/21/2022 21:22:28 Stamp Time: 09/21/2022 21:24:56 Callback Response Time: 09/21/2022 21:26:12  Primary Provider Notified of Diagnostic Impression and Management Plan on: 09/21/2022 22:01:37     ----------------------------------------------------------------------------------------------------  Chief Complaint: suspected seizure  History of Present Illness: Patient is a 42 year old Female. Patient had a suspected seizure this evening, back to baseline by time of arrival to the ED. She was out with friends and her husband and then woke up with the ambulance there and had bitten her tongue, does not remember anything about the seizure. She reports a history of seizures - per chart review I see that she was admitted to [facility] in July after a cluster of events and had vEEG that captured one event and was thought to be  having non-epileptic spells. Her husband reports this episode was very different that her previous events, more violent and longer-lasting, she was drooling during it and she seemed more confused when she regained consciousness, speech was completely garbled for a while after she woke up and then was very combative. Noted that she had a brain MRI in January for evaluation of headaches and this was unremarkable. Head CT in the ED tonight was also unremarkable.    Past Medical History:      Hypertension      Coronary Artery Disease      Seizures      Migraine Headaches  Medications:  No Anticoagulant use  Antiplatelet use: Yes ASA Reviewed EMR for current medications  Allergies:  Reviewed  Social History: Smoking: Former Drug Use: No  Family History:  There Is Family History Of: none pertinent  ROS : 14 Points Review of Systems was performed and was negative except mentioned in HPI.  Past Surgical History: There Is No Surgical History Contributory To Today's Visit    Examination: BP(114/80), Pulse(83),  Neuro Exam: General: Alert,Awake, Oriented to Time, Place, Person  Speech: Fluent:  Language: Intact:  Face: Symmetric:  Facial Sensation: Reduced: Left  Visual Fields: Intact:  Extraocular Movements: Intact:  Motor Exam: No Drift:  Sensation: Intact:  Coordination: Intact:  Spoke with : Dr. Jori Moll    Patient / Family was informed the Neurology Consult would occur via TeleHealth consult by way of interactive audio and video telecommunications and consented to receiving care in this manner.  Patient is being evaluated for possible acute neurologic impairment and high probability of imminent or life - threatening deterioration.I spent total of 30 minutes providing care to this patient,  including time for face to face visit via telemedicine, review of medical records, imaging studies and discussion of findings with providers, the patient and / or  family.   Dr Eleonore Chiquito   [facility] For Inpatient follow-up with [facility] physician please call [facility] [phone]. This is not an outpatient service. Post hospital discharge, please contact hospital directly.  Please do not communicate with [facility] physicians via secure chat. If you have any questions, Please contact [facility]. Please call or reconsult our service if there are any clinical or diagnostic changes.

## 2022-09-21 NOTE — ED Triage Notes (Signed)
Pt also c/o CP and HA

## 2022-09-21 NOTE — Assessment & Plan Note (Signed)
Not on medication 

## 2022-09-21 NOTE — H&P (Signed)
History and Physical    Patient: Chelsea Nolan [id number] DOB: 07/17/1981 DOA: 09/21/2022 DOS: the patient was seen and examined on 09/21/2022 PCP: Midge Minium, PA  Patient coming from: Home  Chief Complaint:  Chief Complaint  Patient presents with   Seizures    HPI: Chelsea Nolan is a 42 y.o. female with medical history significant for hypertension, rheumatoid arthritis and psoriatic arthritis followed by rheumatology at [facility] well as history of asthma, migraines with negative MRI brain 08/16/2022, nonepileptic spells/functional neurologic disorder for which she was hospitalized at [facility] in July 2023 with negative workup that included a normal EEG who was brought in by EMS with a prolonged seizure-like spell lasting 5 to 7 minutes, consisting of generalized shaking in which she bit her tongue followed by a 10-minute postictal period with drooling and now with a headache.   Patient had no recollection of the event and she was previously in her usual state of health prior to the onset of the spell. ED course and data review: Vitals within normal limits.  Labs significant for leukocytosis of 16,800 but otherwise unremarkable.CT head was normal and chest x-ray normal.  EKG, personally viewed and interpreted showed sinus rhythm at 85 with no acute ST-T wave changes. A teleneurology consult was requested with recommendations for overnight observation for recurrent seizures and getting an EEG.  No MRI or AEDs recommended hospitalist consulted for admission.   Review of Systems: As mentioned in the history of present illness. All other systems reviewed and are negative.  Past Medical History:  Diagnosis Date   Anxiety    Arthritis    Depression    GERD (gastroesophageal reflux disease)    Hypertension    Kidney stones    MI (myocardial infarction) ([place])    2022   Migraine headache    Obesity    Psoriatic arthritis [facility])    Dermatologist - Dr. Evorn Gong, Rheumatologist - Dr. Jefm Bryant    Rheumatoid arthritis [facility])    Past Surgical History:  Procedure Laterality Date   ABDOMINAL HYSTERECTOMY     CESAREAN SECTION     pre-eclampsia   LITHOTRIPSY     VAGINAL DELIVERY     Social History:  reports that she quit smoking about 2 years ago. Her smoking use included cigarettes. She has never used smokeless tobacco. She reports that she does not drink alcohol and does not use drugs.  Allergies  Allergen Reactions   Benzonatate Anaphylaxis   Ketorolac Other (See Comments)    Eye burning and feels like throat is closing up, difficulty breathing.  Throat swelling & eye itching  Eye burning and feels like throat is closing up, difficulty breathing.  Eye burning and feels like throat is closing up, difficulty breathing.  Throat swelling & eye itching   Meloxicam Anaphylaxis, Other (See Comments) and Rash    Pt reports possible h/o reaction  Pt reports possible h/o reaction  Pt reports possible h/o reaction  Pt reports possible h/o reaction  Pt reports possible h/o reaction  Pt reports possible h/o reaction  Pt reports possible h/o reaction  Pt reports possible h/o reaction  Pt reports possible h/o reaction   Penicillins Rash, Anaphylaxis and Nausea Only    Other reaction(s): RASH   Cyclobenzaprine Other (See Comments)   Etanercept Other (See Comments) and Nausea And Vomiting    Reaction:  Abdominal pain  Reaction:  Abdominal pain    Abdominal pain      Reaction:  Abdominal pain  Abdominal pain  Abdominal pain  Reaction:  Abdominal pain  Reaction:  Abdominal pain    Abdominal pain  Abdominal pain  Reaction:  Abdominal pain   Sertraline Other (See Comments)    Reaction:  Suicidal ideation  Reaction:  Suicidal ideation    Suicidal ideation   Topiramate Other (See Comments)    Reaction:  Tingling  Reaction:  Tingling   Amoxicillin Rash   Latex Other (See Comments) and Rash    rash  rash  rash  rash  rash   Ondansetron Other (See Comments) and  Rash    Reaction:  Headache    headache    Reaction:  Headache      headache  Reaction:  Headache    headache    Reaction:  Headache  headache  Reaction:  Headache  Reaction:  Headache  headache  Reaction:  Headache    headache    Reaction:  Headache  headache  Reaction:  Headache  Reaction:  Headache   Ondansetron Hcl Other (See Comments) and Rash    Reaction:  Headache  headache    Family History  Problem Relation Age of Onset   Diabetes Mother    Hyperlipidemia Mother    Hypertension Mother    Arthritis Mother    Lupus Mother    Obesity Sister    Cancer Maternal Grandmother        ovarian    Prior to Admission medications   Medication Sig Start Date End Date Taking? Authorizing Provider  atorvastatin (LIPITOR) 80 MG tablet Take 80 mg by mouth daily. 07/29/20 08/23/21  [provider]  clobetasol ointment (TEMOVATE) [id number] % Apply 1 application topically 2 (two) times daily.    [provider]  colchicine 0.6 MG tablet Take 0.6 mg by mouth daily.    [provider]  diazepam (VALIUM) 10 MG tablet Take 5 mg by mouth 3 (three) times daily.    [provider]  gabapentin (NEURONTIN) 400 MG capsule Take 400 mg by mouth 4 (four) times daily. 06/16/19   [provider]  loratadine (CLARITIN) 10 MG tablet Take 10 mg by mouth daily.    [provider]  metoprolol succinate (TOPROL-XL) 50 MG 24 hr tablet Take 100 mg by mouth 2 (two) times daily. 07/29/20 08/23/21  [provider]  nitroGLYCERIN (NITROSTAT) 0.4 MG SL tablet Place under the tongue. 07/29/20   [provider]  olmesartan (BENICAR) 40 MG tablet Take 40 mg by mouth daily. 02/01/20 08/23/21  [provider]  oxyCODONE (OXY IR/ROXICODONE) 5 MG immediate release tablet TAKE 1 TABLET (5 MG TOTAL) BY MOUTH EVERY FOUR (4) HOURS AS NEEDED FOR PAIN FOR UP TO 5 DAYS. 10/26/19   [provider]  pantoprazole (PROTONIX) 40 MG tablet  Take 40 mg by mouth daily.    [provider]  traZODone (DESYREL) 50 MG tablet Take 50 mg by mouth at bedtime.    [provider]  varenicline (CHANTIX) 0.5 MG tablet Take 0.5 mg by mouth 2 (two) times daily.    [provider]    Physical Exam: Vitals:   09/21/22 1912 09/21/22 1913 09/21/22 1952 09/21/22 2134  BP: (!) 137/93   114/80  Pulse: 87  83   Resp: 18  19   Temp: 98.3 F (36.8 C)     TempSrc: Oral     SpO2: 98%  99%   Weight:  88.9 kg     Physical Exam Vitals and nursing  note reviewed.  Constitutional:      General: She is not in acute distress. HENT:     Head: Normocephalic and atraumatic.  Cardiovascular:     Rate and Rhythm: Normal rate and regular rhythm.     Heart sounds: Normal heart sounds.  Pulmonary:     Effort: Pulmonary effort is normal.     Breath sounds: Normal breath sounds.  Abdominal:     Palpations: Abdomen is soft.     Tenderness: There is no abdominal tenderness.  Neurological:     Mental Status: Mental status is at baseline.     Labs on Admission: I have personally reviewed following labs and imaging studies  CBC: Recent Labs  Lab 09/21/22 1948  WBC 16.8*  NEUTROABS 9.5*  HGB 15.9*  HCT 47.1*  MCV 98.7  PLT [id number]   Basic Metabolic Panel: Recent Labs  Lab 09/21/22 1948  NA 135  K 3.7  CL 101  CO2 22  GLUCOSE 109*  BUN 14  CREATININE 0.69  CALCIUM 9.7   GFR: Estimated Creatinine Clearance: 95.8 mL/min (by C-G formula based on SCr of 0.69 mg/dL). Liver Function Tests: No results for input(s): "AST", "ALT", "ALKPHOS", "BILITOT", "PROT", "ALBUMIN" in the last 168 hours. No results for input(s): "LIPASE", "AMYLASE" in the last 168 hours. No results for input(s): "AMMONIA" in the last 168 hours. Coagulation Profile: No results for input(s): "INR", "PROTIME" in the last 168 hours. Cardiac Enzymes: No results for input(s): "CKTOTAL", "CKMB", "CKMBINDEX", "TROPONINI" in the last 168 hours. BNP (last  3 results) No results for input(s): "PROBNP" in the last 8760 hours. HbA1C: No results for input(s): "HGBA1C" in the last 72 hours. CBG: No results for input(s): "GLUCAP" in the last 168 hours. Lipid Profile: No results for input(s): "CHOL", "HDL", "LDLCALC", "TRIG", "CHOLHDL", "LDLDIRECT" in the last 72 hours. Thyroid Function Tests: No results for input(s): "TSH", "T4TOTAL", "FREET4", "T3FREE", "THYROIDAB" in the last 72 hours. Anemia Panel: No results for input(s): "VITAMINB12", "FOLATE", "FERRITIN", "TIBC", "IRON", "RETICCTPCT" in the last 72 hours. Urine analysis:    Component Value Date/Time   COLORURINE AMBER (A) 03/06/2017 0348   APPEARANCEUR CLOUDY (A) 03/06/2017 0348   APPEARANCEUR Cloudy 09/02/2014 2034   LABSPEC 1.023 03/06/2017 0348   LABSPEC 1.021 09/02/2014 2034   PHURINE 5.0 03/06/2017 0348   GLUCOSEU NEGATIVE 03/06/2017 0348   GLUCOSEU Negative 09/02/2014 2034   HGBUR LARGE (A) 03/06/2017 0348   BILIRUBINUR NEGATIVE 03/06/2017 0348   BILIRUBINUR Negative 09/02/2014 2034   KETONESUR NEGATIVE 03/06/2017 0348   PROTEINUR 30 (A) 03/06/2017 0348   UROBILINOGEN 0.2 05/17/2014 1448   NITRITE NEGATIVE 03/06/2017 0348   LEUKOCYTESUR NEGATIVE 03/06/2017 0348   LEUKOCYTESUR Negative 09/02/2014 2034    Radiological Exams on Admission: DG Chest 2 View  Result Date: 09/21/2022 CLINICAL DATA:  Chest pain seizure EXAM: CHEST - 2 VIEW COMPARISON:  08/13/2022 FINDINGS: The heart size and mediastinal contours are within normal limits. Both lungs are clear. The visualized skeletal structures are unremarkable. Metallic device over left chest wall. IMPRESSION: No active cardiopulmonary disease. Electronically Signed   By: Donavan Foil M.D.   On: 09/21/2022 20:27   CT Head Wo Contrast  Result Date: 09/21/2022 CLINICAL DATA:  Seizure EXAM: CT HEAD WITHOUT CONTRAST TECHNIQUE: Contiguous axial images were obtained from the base of the skull through the vertex without intravenous  contrast. RADIATION DOSE REDUCTION: This exam was performed according to the departmental dose-optimization program which includes automated exposure control, adjustment of the mA and/or  kV according to patient size and/or use of iterative reconstruction technique. COMPARISON:  01/21/2015 FINDINGS: Brain: There is no mass, hemorrhage or extra-axial collection. The size and configuration of the ventricles and extra-axial CSF spaces are normal. The brain parenchyma is normal, without acute or chronic infarction. Vascular: No abnormal hyperdensity of the major intracranial arteries or dural venous sinuses. No intracranial atherosclerosis. Skull: The visualized skull base, calvarium and extracranial soft tissues are normal. Sinuses/Orbits: No fluid levels or advanced mucosal thickening of the visualized paranasal sinuses. No mastoid or middle ear effusion. The orbits are normal. IMPRESSION: Normal head CT. Electronically Signed   By: Ulyses Jarred M.D.   On: 09/21/2022 20:13     Data Reviewed: Relevant notes from primary care and specialist visits, past discharge summaries as available in EHR, including Care Everywhere. Prior diagnostic testing as pertinent to current admission diagnoses Updated medications and problem lists for reconciliation ED course, including vitals, labs, imaging, treatment and response to treatment Triage notes, nursing and pharmacy notes and ED provider's notes Notable results as noted in HPI   Assessment and Plan: * Seizure ([place]) History of nonepileptic spells Patient had a witnessed shaking/tonic-clonic spell followed by postictal. Seen by teleneurology Seizure precautions Ativan as needed seizures.  Antiepileptic drugs not recommended by teleneuro EEG in the a.m. Neurology to follow  Migraines Continue home meds  Anxiety Continue trazodone   Coronary artery disease involving native coronary artery of native heart without angina pectoris Continue atorvastatin,  metoprolol and nitroglycerin  Attention deficit hyperactivity disorder (ADHD), predominantly inattentive type Not on medication  Essential hypertension, benign Continue olmesartan, metoprolol  Psoriatic arthritis (HCC) On colchicine    DVT prophylaxis: Lovenox  Consults: neurology, Dr Rory Percy  Advance Care Planning:   Code Status: Prior   Family Communication: husband at bedside  Disposition Plan: Back to previous home environment  Severity of Illness: The appropriate patient status for this patient is OBSERVATION. Observation status is judged to be reasonable and necessary in order to provide the required intensity of service to ensure the patient's safety. The patient's presenting symptoms, physical exam findings, and initial radiographic and laboratory data in the context of their medical condition is felt to place them at decreased risk for further clinical deterioration. Furthermore, it is anticipated that the patient will be medically stable for discharge from the hospital within 2 midnights of admission.   Author: Athena Masse, MD 09/21/2022 10:42 PM  For on call review [email or web address].

## 2022-09-21 NOTE — ED Notes (Signed)
ED TO INPATIENT HANDOFF REPORT  ED Nurse Name and Phone #:   S Name/Age/Gender Clent Demark 42 y.o. female Room/Bed: ED06A/ED06A  Code Status   Code Status: Prior  Home/SNF/Other Home Patient oriented to: self, place, time, and situation Is this baseline? Yes   Triage Complete: Triage complete  Chief Complaint Seizure [facility]) [R56.9]  Triage Note BIB EMS/ witnessed 5-19mn sx/ pt is A&Ox 4/ pt does not remember episode/ hx if sx, no meds  Pt also c/o CP and HA    Allergies Allergies  Allergen Reactions   Benzonatate Anaphylaxis   Ketorolac Other (See Comments)    Eye burning and feels like throat is closing up, difficulty breathing.  Throat swelling & eye itching  Eye burning and feels like throat is closing up, difficulty breathing.  Eye burning and feels like throat is closing up, difficulty breathing.  Throat swelling & eye itching   Meloxicam Anaphylaxis, Other (See Comments) and Rash    Pt reports possible h/o reaction  Pt reports possible h/o reaction  Pt reports possible h/o reaction  Pt reports possible h/o reaction  Pt reports possible h/o reaction  Pt reports possible h/o reaction  Pt reports possible h/o reaction  Pt reports possible h/o reaction  Pt reports possible h/o reaction   Penicillins Rash, Anaphylaxis and Nausea Only    Other reaction(s): RASH   Cyclobenzaprine Other (See Comments)   Etanercept Other (See Comments) and Nausea And Vomiting    Reaction:  Abdominal pain  Reaction:  Abdominal pain    Abdominal pain      Reaction:  Abdominal pain    Abdominal pain  Abdominal pain  Reaction:  Abdominal pain  Reaction:  Abdominal pain    Abdominal pain  Abdominal pain  Reaction:  Abdominal pain   Sertraline Other (See Comments)    Reaction:  Suicidal ideation  Reaction:  Suicidal ideation    Suicidal ideation   Topiramate Other (See Comments)    Reaction:  Tingling  Reaction:  Tingling   Amoxicillin Rash   Latex Other (See  Comments) and Rash    rash  rash  rash  rash  rash   Ondansetron Other (See Comments) and Rash    Reaction:  Headache    headache    Reaction:  Headache      headache  Reaction:  Headache    headache    Reaction:  Headache  headache  Reaction:  Headache  Reaction:  Headache  headache  Reaction:  Headache    headache    Reaction:  Headache  headache  Reaction:  Headache  Reaction:  Headache   Ondansetron Hcl Other (See Comments) and Rash    Reaction:  Headache  headache    Level of Care/Admitting Diagnosis ED Disposition     ED Disposition  Admit   Condition  --   [id number]  Level of Care: Progressive [102]  Admit to Progressive based on following criteria: NEUROLOGICAL AND NEUROSURGICAL complex patients with significant risk of instability, who do not meet ICU criteria, yet require close observation or frequent assessment (< / = every 2 - 4 hours) with medical / nursing intervention.  Covid Evaluation: Asymptomatic - no recent exposure (last 10 days) testing not required  Diagnosis: Seizure ([facility] [[id number] Admitting Physician: DAthena Masse[[id number] Attending Physician: DAthena Masse[[id number]         B Medical/Surgery History Past Medical History:  Diagnosis Date   Anxiety    Arthritis    Depression    GERD (gastroesophageal reflux disease)    Hypertension    Kidney stones    MI (myocardial infarction) ([place])    2022   Migraine headache    Obesity    Psoriatic arthritis ([place])    Dermatologist - Dr. Evorn Gong, Rheumatologist - Dr. Jefm Bryant   Rheumatoid arthritis [facility])    Past Surgical History:  Procedure Laterality Date   ABDOMINAL HYSTERECTOMY     CESAREAN SECTION     pre-eclampsia   LITHOTRIPSY     VAGINAL DELIVERY       A IV Location/Drains/Wounds Patient Lines/Drains/Airways Status     Active Line/Drains/Airways     Name Placement date Placement time Site Days    Peripheral IV 09/21/22 20 G 1" Left Antecubital 09/21/22  1954  Antecubital  less than 1            Intake/Output Last 24 hours No intake or output data in the 24 hours ending 09/21/22 2244  Labs/Imaging Results for orders placed or performed during the hospital encounter of 09/21/22 (from the past 48 hour(s))  CBC with Differential     Status: Abnormal   Collection Time: 09/21/22  7:48 PM  Result Value Ref Range   WBC 16.8 (H) 4.0 - 10.5 K/uL   RBC 4.77 3.87 - 5.11 MIL/uL   Hemoglobin 15.9 (H) 12.0 - 15.0 g/dL   HCT 47.1 (H) 36.0 - 46.0 %   MCV 98.7 80.0 - 100.0 fL   MCH 33.3 26.0 - 34.0 pg   MCHC 33.8 30.0 - 36.0 g/dL   RDW 13.0 11.5 - 15.5 %   Platelets 330 150 - 400 K/uL   nRBC 0.0 0.0 - 0.2 %   Neutrophils Relative % 56 %   Neutro Abs 9.5 (H) 1.7 - 7.7 K/uL   Lymphocytes Relative 33 %   Lymphs Abs 5.5 (H) 0.7 - 4.0 K/uL   Monocytes Relative 7 %   Monocytes Absolute 1.1 (H) 0.1 - 1.0 K/uL   Eosinophils Relative 2 %   Eosinophils Absolute 0.3 0.0 - 0.5 K/uL   Basophils Relative 1 %   Basophils Absolute 0.1 0.0 - 0.1 K/uL   RBC Morphology MORPHOLOGY UNREMARKABLE    Smear Review Normal platelet morphology    Immature Granulocytes 1 %   Abs Immature Granulocytes 0.24 (H) 0.00 - 0.07 K/uL    Comment: Performed at [facility], [address] [place], [place] [id number]  Basic metabolic panel     Status: Abnormal   Collection Time: 09/21/22  7:48 PM  Result Value Ref Range   Sodium 135 135 - 145 mmol/L   Potassium 3.7 3.5 - 5.1 mmol/L   Chloride 101 98 - 111 mmol/L   CO2 22 22 - 32 mmol/L   Glucose, Bld 109 (H) 70 - 99 mg/dL    Comment: Glucose reference range applies only to samples taken after fasting for at least 8 hours.   BUN 14 6 - 20 mg/dL   Creatinine, Ser 0.69 0.44 - 1.00 mg/dL   Calcium 9.7 8.9 - 10.3 mg/dL   GFR, Estimated >60 >60 mL/min    Comment: (NOTE) Calculated using the CKD-EPI Creatinine Equation (2021)    Anion gap 12 5 - 15     Comment: Performed at [facility], [address] [place], [place] [address]  Troponin I (High Sensitivity)     Status: None   Collection  Time: 09/21/22  7:48 PM  Result Value Ref Range   Troponin I (High Sensitivity) 5 <18 ng/L    Comment: (NOTE) Elevated high sensitivity troponin I (hsTnI) values and significant  changes across serial measurements may suggest ACS but many other  chronic and acute conditions are known to elevate hsTnI results.  Refer to the "Links" section for chest pain algorithms and additional  guidance. Performed at [facility], [place], [place] [address]   Troponin I (High Sensitivity)     Status: None   Collection Time: 09/21/22  9:50 PM  Result Value Ref Range   Troponin I (High Sensitivity) 5 <18 ng/L    Comment: (NOTE) Elevated high sensitivity troponin I (hsTnI) values and significant  changes across serial measurements may suggest ACS but many other  chronic and acute conditions are known to elevate hsTnI results.  Refer to the "Links" section for chest pain algorithms and additional  guidance. Performed at [facility], [address] [place], [place] [address]    DG Chest 2 View  Result Date: 09/21/2022 CLINICAL DATA:  Chest pain seizure EXAM: CHEST - 2 VIEW COMPARISON:  08/13/2022 FINDINGS: The heart size and mediastinal contours are within normal limits. Both lungs are clear. The visualized skeletal structures are unremarkable. Metallic device over left chest wall. IMPRESSION: No active cardiopulmonary disease. Electronically Signed   By: Donavan Foil M.D.   On: 09/21/2022 20:27   CT Head Wo Contrast  Result Date: 09/21/2022 CLINICAL DATA:  Seizure EXAM: CT HEAD WITHOUT CONTRAST TECHNIQUE: Contiguous axial images were obtained from the base of the skull through the vertex without intravenous contrast. RADIATION DOSE REDUCTION: This exam was performed according to the departmental dose-optimization  program which includes automated exposure control, adjustment of the mA and/or kV according to patient size and/or use of iterative reconstruction technique. COMPARISON:  01/21/2015 FINDINGS: Brain: There is no mass, hemorrhage or extra-axial collection. The size and configuration of the ventricles and extra-axial CSF spaces are normal. The brain parenchyma is normal, without acute or chronic infarction. Vascular: No abnormal hyperdensity of the major intracranial arteries or dural venous sinuses. No intracranial atherosclerosis. Skull: The visualized skull base, calvarium and extracranial soft tissues are normal. Sinuses/Orbits: No fluid levels or advanced mucosal thickening of the visualized paranasal sinuses. No mastoid or middle ear effusion. The orbits are normal. IMPRESSION: Normal head CT. Electronically Signed   By: Ulyses Jarred M.D.   On: 09/21/2022 20:13    Pending Labs Unresulted Labs (From admission, onward)    None       Vitals/Pain Today's Vitals   09/21/22 1912 09/21/22 1913 09/21/22 1952 09/21/22 2134  BP: (!) 137/93   114/80  Pulse: 87  83   Resp: 18  19   Temp: 98.3 F (36.8 C)     TempSrc: Oral     SpO2: 98%  99%   Weight:  196 lb (88.9 kg)    PainSc: 6        Isolation Precautions No active isolations  Medications Medications - No data to display  Mobility walks     Focused Assessments    R Recommendations: See Admitting Provider Note  Report given to:   Additional Notes:

## 2022-09-21 NOTE — Assessment & Plan Note (Signed)
On colchicine

## 2022-09-21 NOTE — Assessment & Plan Note (Addendum)
Continue trazodone 

## 2022-09-21 NOTE — ED Notes (Signed)
called to Neurology(telespecialist)per MD Mumma.Marland KitchenMarland Kitchen

## 2022-09-21 NOTE — Assessment & Plan Note (Signed)
History of nonepileptic spells Patient had a witnessed shaking/tonic-clonic spell followed by postictal. Seen by teleneurology Seizure precautions Ativan as needed seizures.  Antiepileptic drugs not recommended by teleneuro EEG in the a.m. Neurology to follow

## 2022-09-22 DIAGNOSIS — F444 Conversion disorder with motor symptom or deficit: Secondary | ICD-10-CM | POA: Diagnosis not present

## 2022-09-22 DIAGNOSIS — I10 Essential (primary) hypertension: Secondary | ICD-10-CM | POA: Diagnosis not present

## 2022-09-22 DIAGNOSIS — R569 Unspecified convulsions: Secondary | ICD-10-CM | POA: Diagnosis not present

## 2022-09-22 DIAGNOSIS — D72829 Elevated white blood cell count, unspecified: Secondary | ICD-10-CM | POA: Diagnosis present

## 2022-09-22 LAB — CBC
HCT: 47.3 % — ABNORMAL HIGH (ref 36.0–46.0)
Hemoglobin: 15.7 g/dL — ABNORMAL HIGH (ref 12.0–15.0)
MCH: 33.7 pg (ref 26.0–34.0)
MCHC: 33.2 g/dL (ref 30.0–36.0)
MCV: 101.5 fL — ABNORMAL HIGH (ref 80.0–100.0)
Platelets: 386 10*3/uL (ref 150–400)
RBC: 4.66 MIL/uL (ref 3.87–5.11)
RDW: 13.3 % (ref 11.5–15.5)
WBC: 20.3 10*3/uL — ABNORMAL HIGH (ref 4.0–10.5)
nRBC: 0 % (ref 0.0–0.2)

## 2022-09-22 LAB — HIV ANTIBODY (ROUTINE TESTING W REFLEX): HIV Screen 4th Generation wRfx: NONREACTIVE

## 2022-09-22 LAB — PROCALCITONIN: Procalcitonin: <0.1 ng/mL

## 2022-09-22 MED ORDER — LEVETIRACETAM IN NACL 1000 MG/100ML IV SOLN
1000.0000 mg | INTRAVENOUS | Status: AC
  Administered 2022-09-22: 1000 mg via INTRAVENOUS
  Filled 2022-09-22: qty 100

## 2022-09-22 MED ORDER — ACETAMINOPHEN 325 MG PO TABS: 650.0000 mg | ORAL_TABLET | ORAL | Status: DC | PRN

## 2022-09-22 MED ORDER — ACETAMINOPHEN 650 MG RE SUPP: 650.0000 mg | RECTAL | Status: DC | PRN

## 2022-09-22 MED ORDER — LEVETIRACETAM 500 MG PO TABS: 500.0000 mg | ORAL_TABLET | Freq: Two times a day (BID) | ORAL | 5 refills | Status: AC

## 2022-09-22 MED ORDER — LEVETIRACETAM 500 MG PO TABS: 500.0000 mg | ORAL_TABLET | Freq: Two times a day (BID) | ORAL | Status: DC

## 2022-09-22 NOTE — Hospital Course (Signed)
42 year old female with past medical history of hypertension, morbid obesity, rheumatoid arthritis, psoriatic arthritis, asthma and conversion disorder with pseudoseizures worked up after episode in July 2023 who presented to the emergency room on 2/19 after having a witnessed seizure-like episode that lasted 5 to 7 minutes followed by 10-minute postictal period.  Patient brought in for further evaluation.

## 2022-09-22 NOTE — Progress Notes (Signed)
Neurology Progress Note   S:// Seen and examined. No further seizure-like episode in the hospital Briefly, 42 year old woman, with a past medical history of hypertension, migraines, CAD/MI, PTSD, psoriatic arthritis, depression, 1 prior episode of sudden loss of consciousness with arm shaking will workup at [facility] in 2023 with LTM EEG concerning for nonepileptic spells captured on EEG discharged with plan to follow-up outpatient neurology, presenting to the ER yesterday with concerns for 5 to 7-minute episode of generalized shaking, in which she also bit her tongue followed by brief postictal state where she was confused, drooling from her face and then complained of a headache.  She is amnestic to the event. According to the husband, she was standing at home, when they heard her yell out and then start to pass out.  The husband and another friend who were on scene helped her down to the ground.  She then had the shaking episode that lasted a few minutes followed by ensuing episode of confusion prior to returning to baseline slowly.  In the emergency room she had leukocytosis of 16,800 on labs.  The husband reports that this seizure episode was very different than the prior one that they had witnessed.  She was very out of it, and the episode itself was very long and her speech remained garbled for a considerable amount of time returning back to baseline.  She also has a history of migraines which have been somewhat out of control.  She had an MRI done a month ago which was normal.   O:// Current vital signs: BP (!) 140/83 (BP Location: Right Arm)   Pulse 66   Temp 98.4 F (36.9 C) (Oral)   Resp 14   Ht 5' 2"$  (1.575 m)   Wt 89.6 kg   LMP 02/06/2015   SpO2 97%   BMI 36.12 kg/m  Vital signs in last 24 hours: Temp:  [98.3 F (36.8 C)-98.4 F (36.9 C)] 98.4 F (36.9 C) (02/20 0405) Pulse Rate:  [66-87] 66 (02/20 0837) Resp:  [14-19] 14 (02/20 0837) BP: (103-140)/(63-93) 140/83 (02/20  0837) SpO2:  [97 %-100 %] 97 % (02/20 0837) Weight:  [88.9 kg-89.6 kg] 89.6 kg (02/20 0004) General: Awake alert in no distress HEENT: Normocephalic, atraumatic, tongue bite on the right lateral edge of the tongue. CVS: Regular rhythm Respiratory: Breathing well saturating normally on room air Abdomen nondistended nontender Neurological exam Awake alert oriented x 3, no dysarthria, cranial nerves II to XII intact Motor examination with no drift, sensory exam with no sensory deficits, coordination exam with no evidence of dysmetria.  Gait testing deferred at this time. Normal neurological exam   Medications  Current Facility-Administered Medications:    0.9 %  sodium chloride infusion, 75 mL/hr, Intravenous, Continuous, [name] V, MD, Last Rate: 75 mL/hr at 09/22/22 [id number], Infusion Verify at 09/22/22 [id number]   acetaminophen (TYLENOL) tablet 650 mg, 650 mg, Oral, Q4H PRN **OR** acetaminophen (TYLENOL) suppository 650 mg, 650 mg, Rectal, Q4H PRN, Athena Masse, MD   atorvastatin (LIPITOR) tablet 80 mg, 80 mg, Oral, Daily, [name] V, MD, 80 mg at 09/22/22 [id number]   colchicine tablet 0.6 mg, 0.6 mg, Oral, Daily, [name] V, MD, 0.6 mg at 09/22/22 0903   enoxaparin (LOVENOX) injection 45 mg, 0.5 mg/kg, Subcutaneous, Q24H, [name] V, MD, 45 mg at 09/22/22 [id number]   gabapentin (NEURONTIN) capsule 400 mg, 400 mg, Oral, TID with meals, 400 mg at 09/22/22 0902 **AND** gabapentin (NEURONTIN) capsule 800 mg, 800 mg, Oral,  QHS, Renda Rolls, RPH, 800 mg at 09/22/22 0022   irbesartan (AVAPRO) tablet 300 mg, 300 mg, Oral, Daily, [name] V, MD, 300 mg at 09/22/22 0902   LORazepam (ATIVAN) injection 2 mg, 2 mg, Intravenous, Q5 Min x 2 PRN, Athena Masse, MD   metoprolol succinate (TOPROL-XL) 24 hr tablet 100 mg, 100 mg, Oral, BID, [name] V, MD, 100 mg at 09/22/22 0902   nitroGLYCERIN (NITROSTAT) SL tablet 0.4 mg, 0.4 mg, Sublingual, Q5 min PRN, Athena Masse, MD    ondansetron [facility]) tablet 4 mg, 4 mg, Oral, Q6H PRN **OR** ondansetron (ZOFRAN) injection 4 mg, 4 mg, Intravenous, Q6H PRN, Athena Masse, MD   Oral care mouth rinse, 15 mL, Mouth Rinse, Q2H, [name] V, MD, 15 mL at 09/22/22 0725   Oral care mouth rinse, 15 mL, Mouth Rinse, PRN, Athena Masse, MD   oxyCODONE (Oxy IR/ROXICODONE) immediate release tablet 5 mg, 5 mg, Oral, Q4H PRN, [name] V, MD, 5 mg at 09/22/22 0903   pantoprazole (PROTONIX) EC tablet 40 mg, 40 mg, Oral, Daily, [name] V, MD, 40 mg at 09/22/22 0903   traZODone (DESYREL) tablet 50 mg, 50 mg, Oral, QHS, [name] V, MD, 50 mg at 09/22/22 0022 Labs CBC    Component Value Date/Time   WBC 20.3 (H) 09/22/2022 0029   RBC 4.66 09/22/2022 0029   HGB 15.7 (H) 09/22/2022 0029   HGB 14.4 09/02/2014 2034   HCT 47.3 (H) 09/22/2022 0029   HCT 43.1 09/02/2014 2034   PLT 386 09/22/2022 0029   PLT 334 09/02/2014 2034   MCV 101.5 (H) 09/22/2022 0029   MCV 95 09/02/2014 2034   MCH 33.7 09/22/2022 0029   MCHC 33.2 09/22/2022 0029   RDW 13.3 09/22/2022 0029   RDW 14.1 09/02/2014 2034   LYMPHSABS 5.5 (H) 09/21/2022 1948   LYMPHSABS 5.4 (H) 09/02/2014 2034   MONOABS 1.1 (H) 09/21/2022 1948   MONOABS 1.3 (H) 09/02/2014 2034   EOSABS 0.3 09/21/2022 1948   EOSABS 0.5 09/02/2014 2034   BASOSABS 0.1 09/21/2022 1948   BASOSABS 0.2 (H) 09/02/2014 2034    CMP     Component Value Date/Time   NA 135 09/21/2022 1948   NA 141 09/02/2014 2034   K 3.7 09/21/2022 1948   K 3.8 09/02/2014 2034   CL 101 09/21/2022 1948   CL 108 (H) 09/02/2014 2034   CO2 22 09/21/2022 1948   CO2 28 09/02/2014 2034   GLUCOSE 109 (H) 09/21/2022 1948   GLUCOSE 100 (H) 09/02/2014 2034   BUN 14 09/21/2022 1948   BUN 9 09/02/2014 2034   CREATININE 0.69 09/21/2022 1948   CREATININE 0.68 09/02/2014 2034   CALCIUM 9.7 09/21/2022 1948   CALCIUM 8.9 09/02/2014 2034   PROT 7.6 08/13/2022 1939   PROT 7.3 09/02/2014 2034   ALBUMIN 3.8  08/13/2022 1939   ALBUMIN 3.4 09/02/2014 2034   AST 30 08/13/2022 1939   AST 26 09/02/2014 2034   ALT 29 08/13/2022 1939   ALT 35 09/02/2014 2034   ALKPHOS 97 08/13/2022 1939   ALKPHOS 112 09/02/2014 2034   BILITOT 0.5 08/13/2022 1939   BILITOT 0.2 09/02/2014 2034   GFRNONAA >60 09/21/2022 1948   GFRNONAA >60 09/02/2014 2034   GFRNONAA >60 04/21/2014 2332   GFRAA >60 07/12/2019 1855   GFRAA >60 09/02/2014 2034   GFRAA >60 04/21/2014 2332    Imaging I have reviewed images in epic and the results pertinent  to this consultation are: CT head unremarkable  Assessment:  42 year old with a past medical history of seizure-like episode with LTM EEG at [facility] that captured the episode with no electrographic change test consistent with nonepileptic spells, presenting with another episode of seizure-like activity along with tongue bite and ensuing postictal state. The history and the fact that she had a tongue bite and has reactive leukocytosis point towards true seizure rather than a nonepileptic spell.  Patients with history of nonepileptic spells can have underlying seizures and vast majority of them. 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 At this time, due to multiplicity of these events, I would recommend starting an antiepileptic.  Recommendations: Keppra 1 g IV x 1 followed by Keppra 500 twice daily p.o. Maintain seizure precautions as below Discussed in detail driving restrictions as set forth in the [employer].  She and the husband verbalized understanding Follow-up with outpatient neurology in 8 to 12 weeks Consider repeat long-term monitoring-EMU as an outpatient.  Plan discussed with Dr. Maryland Pink  -- Amie Portland, MD Neurologist [facility] Pager: [phone]  [place] Per [facility] statutes, patients with seizures are not allowed to drive until they have been seizure-free for six  months.   Use caution when using heavy equipment or power tools. Avoid working on ladders or at heights. Take showers instead of baths. Ensure the water temperature is not too high on the home water heater. Do not go swimming alone. Do not lock yourself in a room alone (i.e. bathroom). When caring for infants or small children, sit down when holding, feeding, or changing them to minimize risk of injury to the child in the event you have a seizure. Maintain good sleep hygiene. Avoid alcohol.    If patient has another seizure, call 911 and bring them back to the ED if: A.  The seizure lasts longer than 5 minutes.      B.  The patient doesn't wake shortly after the seizure or has new problems such as difficulty seeing, speaking or moving following the seizure C.  The patient was injured during the seizure D.  The patient has a temperature over 102 F (39C) E.  The patient vomited during the seizure and now is having trouble breathing

## 2022-09-22 NOTE — Progress Notes (Signed)
Eeg done 

## 2022-09-22 NOTE — Plan of Care (Signed)

## 2022-09-22 NOTE — TOC CM/SW Note (Signed)
Patient has orders to discharge home today. Chart reviewed. No TOC needs identified. CSW signing off.  Dayton Scrape, [place]

## 2022-09-22 NOTE — Assessment & Plan Note (Signed)
Meets criteria with BMI greater than 35 and comorbidity of hypertension

## 2022-09-22 NOTE — Assessment & Plan Note (Addendum)
Could be stress margination.  White blood cell count at 16 on admission.  Repeat at 20.  Patient advises me that because of her psoriatic arthritis, her numbers are always high.  In addition, procalcitonin level normal.

## 2022-09-22 NOTE — Progress Notes (Signed)
  Chaplain On-Call responded to Spiritual Care Consult Order from [name], MD. The Order was for Advance Directives information for the patient.  Chaplain met the patient and her husband and provided the Advance Directives documents and education to them.  Chaplain explained the process for completion if the patient wants to do this while in the hospital. Patient stated her understanding.  Chaplain Charlie Kween Bacorn Gerilyn Pilgrim., [place]

## 2022-09-22 NOTE — Progress Notes (Signed)
Routine EEG is normal. Plan as described in the progress note from earlier in the day  - Keppra 500 twice daily - Maintain seizure precautions - Follow-up with outpatient neurology in 8 to 12 weeks - Consideration for long-term EEG/EMU per outpatient neurology.  Inpatient neurology will be available as needed Please call with questions Plan was relayed to Dr. Maryland Pink  -- Amie Portland, MD Neurologist [facility] Pager: [phone]

## 2022-09-22 NOTE — Assessment & Plan Note (Signed)
As stated above.  Previous workup clear, however this felt to be real seizure.

## 2022-09-22 NOTE — Procedures (Signed)
Patient Name: Chelsea Nolan  MRN: [id number]  Epilepsy Attending: Lora Havens  Referring Physician/Provider: Athena Masse, MD  Date: 09/22/2022 Duration: 36.43 mins  Patient history: 42 y/o woman with suspected seizure tonight. EEG to evaluate for seizure.  Level of alertness: Awake  AEDs during EEG study: LEV, GBP  Technical aspects: This EEG study was done with scalp electrodes positioned according to the 10-20 International system of electrode placement. Electrical activity was reviewed with band pass filter of 1-70Hz$ , sensitivity of 7 uV/mm, display speed of 62m/sec with a 60Hz$  notched filter applied as appropriate. EEG data were recorded continuously and digitally stored.  Video monitoring was available and reviewed as appropriate.  Description: The posterior dominant rhythm consists of 9-10 Hz activity of moderate voltage (25-35 uV) seen predominantly in posterior head regions, symmetric and reactive to eye opening and eye closing. Hyperventilation and photic stimulation were not performed.     IMPRESSION: This study is within normal limits. No seizures or epileptiform discharges were seen throughout the recording.  A normal interictal EEG does not exclude the diagnosis of epilepsy.  [name] [name]

## 2022-09-22 NOTE — Discharge Summary (Signed)
Physician Discharge Summary   Patient: VALEDA CUNNANE MRN: [id number] DOB: [birth year]  Admit date:     09/21/2022  Discharge date: 09/22/22  Discharge Physician: Annita Brod   PCP: Midge Minium, PA   Recommendations at discharge:   New medication: Keppra 500 p.o. 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  Patient brought in for further evaluation.  Assessment and Plan: * Seizure [facility]) Given patient's previous history of conversion disorder and pseudoseizures, certainly makes event that occurred possible that this was not a true seizure.  That said, patient did have tongue biting episode.  Case discussed with neurology and they agree that this is more likely true seizure.  Etiology unclear.  Patient had been on Oxy IR changed over to Suboxone and then she stopped Suboxone because she did not like how it made her feel.  However, she confirmed that she has not been on Suboxone for  almost a month therefore, this is not from withdrawal.    No evidence of additional infection.  Nevertheless, patient will need to stay on Keppra.  Outpatient follow-up with neurology in 8 to 12 weeks.  She is advised that she cannot drive for the next 6 months.  Patient voices understanding of this.  EEG checked prior to discharge unremarkable.  Cleared for discharge by neurology.  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  Previous workup clear, however this felt to be real seizure.  Essential hypertension, benign Continue olmesartan, metoprolol.  Patient also recently started on clonidine  Leukocytosis Could be stress margination.  White blood cell count at 16 on admission.  Repeat at 20.  Patient advises me that because of her psoriatic arthritis, her numbers are always high.  In addition, procalcitonin level normal.  Psoriatic arthritis ([facility]) On colchicine  Anxiety Continue trazodone   Coronary artery disease involving native coronary artery of native heart without angina pectoris Continue atorvastatin, metoprolol and nitroglycerin  Migraines Continue home meds  Attention deficit hyperactivity disorder (ADHD), predominantly inattentive type Not on medication  Morbid obesity ([place]) Meets criteria with BMI greater than 35 and comorbidity of hypertension         Consultants: Neurology Procedures performed: EEG Disposition: Home Diet recommendation:  Discharge Diet Orders (From admission, onward)     Start     Ordered   09/22/22 0000  Diet - low sodium heart healthy        09/22/22 1408           Heart healthy DISCHARGE MEDICATION: Allergies as of 09/22/2022  Reactions   Benzonatate Anaphylaxis   Ketorolac Other (See Comments)   Eye burning and feels like throat is closing up, difficulty breathing. Throat swelling & eye itching Eye burning and feels like throat is closing up, difficulty  breathing.  Eye burning and feels like throat is closing up, difficulty breathing.  Throat swelling & eye itching   Meloxicam Anaphylaxis, Other (See Comments), Rash   Pt reports possible h/o reaction Pt reports possible h/o reaction  Pt reports possible h/o reaction  Pt reports possible h/o reaction  Pt reports possible h/o reaction  Pt reports possible h/o reaction  Pt reports possible h/o reaction  Pt reports possible h/o reaction  Pt reports possible h/o reaction   Penicillins Rash, Anaphylaxis, Nausea Only   Other reaction(s): RASH   Cyclobenzaprine Other (See Comments)   Etanercept Other (See Comments), Nausea And Vomiting   Reaction:  Abdominal pain Reaction:  Abdominal pain    Abdominal pain      Reaction:  Abdominal pain    Abdominal pain  Abdominal pain  Reaction:  Abdominal pain Reaction:  Abdominal pain    Abdominal pain  Abdominal pain  Reaction:  Abdominal pain   Sertraline Other (See Comments)   Reaction:  Suicidal ideation Reaction:  Suicidal ideation    Suicidal ideation   Topiramate Other (See Comments)   Reaction:  Tingling Reaction:  Tingling   Amoxicillin Rash   Latex Other (See Comments), Rash   rash rash  rash  rash  rash   Ondansetron Other (See Comments), Rash   Reaction:  Headache    headache    Reaction:  Headache      headache  Reaction:  Headache    headache    Reaction:  Headache  headache  Reaction:  Headache  Reaction:  Headache headache  Reaction:  Headache    headache    Reaction:  Headache  headache  Reaction:  Headache  Reaction:  Headache   Ondansetron Hcl Other (See Comments), Rash   Reaction:  Headache headache        Medication List     STOP taking these medications    oxyCODONE 5 MG immediate release tablet Commonly known as: Oxy IR/ROXICODONE       TAKE these medications    atorvastatin 80 MG tablet Commonly known as: LIPITOR Take 80 mg by mouth daily.   clobetasol ointment 0.05  % Commonly known as: TEMOVATE Apply 1 application topically 2 (two) times daily.   colchicine 0.6 MG tablet Take 0.6 mg by mouth daily.   diazepam 10 MG tablet Commonly known as: VALIUM Take 5 mg by mouth 3 (three) times daily.   gabapentin 400 MG capsule Commonly known as: NEURONTIN Take 400 mg by mouth 4 (four) times daily.   levETIRAcetam 500 MG tablet Commonly known as: KEPPRA Take 1 tablet (500 mg total) by mouth 2 (two) times daily.   loratadine 10 MG tablet Commonly known as: CLARITIN Take 10 mg by mouth daily.   metoprolol succinate 50 MG 24 hr tablet Commonly known as: TOPROL-XL Take 100 mg by mouth 2 (two) times daily.   nitroGLYCERIN 0.4 MG SL tablet Commonly known as: NITROSTAT Place under the tongue.   olmesartan 40 MG tablet Commonly known as: BENICAR Take 40 mg by mouth daily.   pantoprazole 40 MG tablet Commonly known as: PROTONIX Take 40 mg by mouth daily.   traZODone 50 MG tablet Commonly known as: DESYREL Take 50 mg by mouth at bedtime.  varenicline 0.5 MG tablet Commonly known as: CHANTIX Take 0.5 mg by mouth 2 (two) times daily.        Follow-up Information     Midge Minium, [place]. Schedule an appointment as soon as possible for a visit .   Specialty: Family Medicine Contact information: [place] [address] [phone]         Vladimir Crofts, MD. Schedule an appointment as soon as possible for a visit.   Specialty: Neurology Why: 8-12 weeks follow-up Contact information: [facility] [place] [address] [phone]                Discharge Exam: Danley Danker Weights   09/21/22 1913 10/07/22 0004  Weight: 88.9 kg 89.6 kg   General: Alert and oriented x 3, no acute distress Cardiovascular: Regular rate and rhythm, S1-S2  Condition at discharge: good  The results of significant diagnostics from this hospitalization (including imaging,  microbiology, ancillary and laboratory) are listed below for reference.   Imaging Studies: EEG adult  Result Date: 10/07/22 Lora Havens, MD     2022-10-07  1:54 PM Patient Name: LYRICC BORRA MRN: [id number] Epilepsy Attending: Lora Havens Referring Physician/Provider: Athena Masse, MD Date: 10-07-22 Duration: 36.43 mins Patient history: 42 y/o woman with suspected seizure tonight. EEG to evaluate for seizure. Level of alertness: Awake AEDs during EEG study: LEV, GBP Technical aspects: This EEG study was done with scalp electrodes positioned according to the 10-20 International system of electrode placement. Electrical activity was reviewed with band pass filter of 1-70Hz$ , sensitivity of 7 uV/mm, display speed of 65m/sec with a 60Hz$  notched filter applied as appropriate. EEG data were recorded continuously and digitally stored.  Video monitoring was available and reviewed as appropriate. Description: The posterior dominant rhythm consists of 9-10 Hz activity of moderate voltage (25-35 uV) seen predominantly in posterior head regions, symmetric and reactive to eye opening and eye closing. Hyperventilation and photic stimulation were not performed.   IMPRESSION: This study is within normal limits. No seizures or epileptiform discharges were seen throughout the recording. A normal interictal EEG does not exclude the diagnosis of epilepsy. PLora Havens  DG Chest 2 View  Result Date: 09/21/2022 CLINICAL DATA:  Chest pain seizure EXAM: CHEST - 2 VIEW COMPARISON:  08/13/2022 FINDINGS: The heart size and mediastinal contours are within normal limits. Both lungs are clear. The visualized skeletal structures are unremarkable. Metallic device over left chest wall. IMPRESSION: No active cardiopulmonary disease. Electronically Signed   By: KDonavan FoilM.D.   On: 09/21/2022 20:27   CT Head Wo Contrast  Result Date: 09/21/2022 CLINICAL DATA:  Seizure EXAM: CT HEAD WITHOUT CONTRAST TECHNIQUE:  Contiguous axial images were obtained from the base of the skull through the vertex without intravenous contrast. RADIATION DOSE REDUCTION: This exam was performed according to the departmental dose-optimization program which includes automated exposure control, adjustment of the mA and/or kV according to patient size and/or use of iterative reconstruction technique. COMPARISON:  01/21/2015 FINDINGS: Brain: There is no mass, hemorrhage or extra-axial collection. The size and configuration of the ventricles and extra-axial CSF spaces are normal. The brain parenchyma is normal, without acute or chronic infarction. Vascular: No abnormal hyperdensity of the major intracranial arteries or dural venous sinuses. No intracranial atherosclerosis. Skull: The visualized skull base, calvarium and extracranial soft tissues are normal. Sinuses/Orbits: No fluid levels or advanced mucosal thickening of the visualized paranasal sinuses.  No mastoid or middle ear effusion. The orbits are normal. IMPRESSION: Normal head CT. Electronically Signed   By: Ulyses Jarred M.D.   On: 09/21/2022 20:13    Microbiology: Results for orders placed or performed during the hospital encounter of 07/12/19  SARS CORONAVIRUS 2 (TAT 6-24 HRS) Nasopharyngeal Nasopharyngeal Swab     Status: None   Collection Time: 07/12/19  7:24 PM   Specimen: Nasopharyngeal Swab  Result Value Ref Range Status   SARS Coronavirus 2 NEGATIVE NEGATIVE Final    Comment: (NOTE) SARS-CoV-2 target nucleic acids are NOT DETECTED. The SARS-CoV-2 RNA is generally detectable in upper and lower respiratory specimens during the acute phase of infection. Negative results do not preclude SARS-CoV-2 infection, do not rule out co-infections with other pathogens, and should not be used as the sole basis for treatment or other patient management decisions. Negative results must be combined with clinical observations, patient history, and epidemiological information. The  expected result is Negative. Fact Sheet for Patients: SugarRoll.be Fact Sheet for Healthcare Providers: https://www.woods-mathews.com/ This test is not yet approved or cleared by the [place] FDA and  has been authorized for detection and/or diagnosis of SARS-CoV-2 by FDA under an Emergency Use Authorization (EUA). This EUA will remain  in effect (meaning this test can be used) for the duration of the COVID-19 declaration under Section 56 4(b)(1) of the Act, 21 U.S.C. section 360bbb-3(b)(1), unless the authorization is terminated or revoked sooner. Performed at [facility], [place] [address], [place], [place] [address]   Respiratory Panel by RT PCR (Flu A&B, Covid) - Nasopharyngeal Swab     Status: None   Collection Time: 07/12/19 10:25 PM   Specimen: Nasopharyngeal Swab  Result Value Ref Range Status   SARS Coronavirus 2 by RT PCR NEGATIVE NEGATIVE Final    Comment: (NOTE) SARS-CoV-2 target nucleic acids are NOT DETECTED. The SARS-CoV-2 RNA is generally detectable in upper respiratoy specimens during the acute phase of infection. The lowest concentration of SARS-CoV-2 viral copies this assay can detect is 131 copies/mL. A negative result does not preclude SARS-Cov-2 infection and should not be used as the sole basis for treatment or other patient management decisions. A negative result may occur with  improper specimen collection/handling, submission of specimen other than nasopharyngeal swab, presence of viral mutation(s) within the areas targeted by this assay, and inadequate number of viral copies (<131 copies/mL). A negative result must be combined with clinical observations, patient history, and epidemiological information. The expected result is Negative. Fact Sheet for Patients:  PinkCheek.be Fact Sheet for Healthcare Providers:  GravelBags.it This test is not yet  ap proved or cleared by the [place] FDA and  has been authorized for detection and/or diagnosis of SARS-CoV-2 by FDA under an Emergency Use Authorization (EUA). This EUA will remain  in effect (meaning this test can be used) for the duration of the COVID-19 declaration under Section 564(b)(1) of the Act, 21 U.S.C. section 360bbb-3(b)(1), unless the authorization is terminated or revoked sooner.    Influenza A by PCR NEGATIVE NEGATIVE Final   Influenza B by PCR NEGATIVE NEGATIVE Final    Comment: (NOTE) The Xpert Xpress SARS-CoV-2/FLU/RSV assay is intended as an aid in  the diagnosis of influenza from Nasopharyngeal swab specimens and  should not be used as a sole basis for treatment. Nasal washings and  aspirates are unacceptable for Xpert Xpress SARS-CoV-2/FLU/RSV  testing. Fact Sheet for Patients: PinkCheek.be Fact Sheet for Healthcare Providers: GravelBags.it This test is not  yet approved or cleared by the [place] and  has been authorized for detection and/or diagnosis of SARS-CoV-2 by  FDA under an Emergency Use Authorization (EUA). This EUA will remain  in effect (meaning this test can be used) for the duration of the  Covid-19 declaration under Section 564(b)(1) of the Act, 21  U.S.C. section 360bbb-3(b)(1), unless the authorization is  terminated or revoked. Performed at [facility], [address] [place], [place] [address]   Culture, blood (x 2)     Status: None   Collection Time: 07/13/19  5:30 AM   Specimen: BLOOD  Result Value Ref Range Status   Specimen Description BLOOD BLOOD RIGHT HAND  Final   Special Requests   Final    BOTTLES DRAWN AEROBIC AND ANAEROBIC Blood Culture adequate volume   Culture   Final    NO GROWTH 5 DAYS Performed at [facility], [address] [place], [place] [address]    Report Status 07/18/2019 FINAL  Final  Culture, blood (routine x 2) Call  MD if unable to obtain prior to antibiotics being given     Status: None   Collection Time: 07/13/19  5:30 AM   Specimen: BLOOD  Result Value Ref Range Status   Specimen Description BLOOD RIGHT ANTECUBITAL  Final   Special Requests   Final    BOTTLES DRAWN AEROBIC AND ANAEROBIC Blood Culture results may not be optimal due to an excessive volume of blood received in culture bottles   Culture   Final    NO GROWTH 5 DAYS Performed at [facility], [address] [place], [place] [address]    Report Status 07/18/2019 FINAL  Final    Labs: CBC: Recent Labs  Lab 09/21/22 1948 09/22/22 0029  WBC 16.8* 20.3*  NEUTROABS 9.5*  --   HGB 15.9* 15.7*  HCT 47.1* 47.3*  MCV 98.7 101.5*  PLT 330 [id number]   Basic Metabolic Panel: Recent Labs  Lab 09/21/22 1948  NA 135  K 3.7  CL 101  CO2 22  GLUCOSE 109*  BUN 14  CREATININE 0.69  CALCIUM 9.7   Liver Function Tests: No results for input(s): "AST", "ALT", "ALKPHOS", "BILITOT", "PROT", "ALBUMIN" in the last 168 hours. CBG: No results for input(s): "GLUCAP" in the last 168 hours.  Discharge time spent: less than 30 minutes.  Signed: Annita Brod, MD [facility] 09/22/2022

## 2024-03-14 NOTE — Therapy (Signed)
 OUTPATIENT PHYSICAL THERAPY EVALUATION   Patient Name: Chelsea Nolan MRN: [id number] DOB:March 21, 1981, 43 y.o., female Today's Date: 03/15/2024  END OF SESSION:  PT End of Session - 03/15/24 1517     Visit Number 1    Number of Visits 17    Date for PT Re-Evaluation 06/07/24    Authorization Type [employer] MEDICARE reporting period from 03/15/2024    Progress Note Due on Visit 10    PT Start Time 1353    PT Stop Time 1442    PT Time Calculation (min) 49 min    Activity Tolerance Patient limited by pain;Patient tolerated treatment well    Behavior During Therapy [facility] for tasks assessed/performed          Past Medical History:  Diagnosis Date   Anxiety    Arthritis    Depression    GERD (gastroesophageal reflux disease)    Hypertension    Kidney stones    MI (myocardial infarction) (HCC)    2022   Migraine headache    Obesity    Psoriatic arthritis [facility])    Dermatologist - Dr. Dela, Rheumatologist - Dr. 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), with abnormal movement 02/23/2022   Anxiety 11/13/2020   Coronary artery disease involving native coronary artery of native heart without angina pectoris 08/06/2020   NSTEMI (non-ST elevated myocardial infarction) (HCC) 07/28/2020   Acute respiratory failure with hypoxia (HCC) 07/12/2019   Acute bronchitis 07/12/2019   Sepsis (HCC) 07/12/2019   Attention deficit hyperactivity disorder (ADHD), predominantly inattentive type 06/20/2019   Mild episode of recurrent major depressive disorder (HCC) 09/12/2018   Hyperlipidemia 03/18/2016   Chest pain 02/05/2016   Migraines 01/16/2015   Renal stones  05/17/2014   PTSD (post-traumatic stress disorder) 04/02/2014   Family history of ovarian cancer 11/15/2013   Psoriatic arthritis (HCC) 01/26/2013   Essential hypertension, benign 01/26/2013   Morbid obesity (HCC) 01/26/2013    PCP: Quin Damien Maier, PA  REFERRING PROVIDER: Janith Drivers, DO [facility])  REFERRING DIAG: Janith Drivers, DO [facility])  THERAPY DIAG:  Cervicalgia  Neuralgia and neuritis  Rationale for Evaluation and Treatment: Rehabilitation  ONSET DATE: 03/2023  SUBJECTIVE:  SUBJECTIVE STATEMENT: Patient is a 43 y.o. female who presents to outpatient physical therapy with a referral for medical diagnosis radiculitis of right cervical region. This patient's chief complaints consist of neck pain, thoracic spine pain, and left UE pain leading to the following functional deficits: difficulty with usual activities including anything anything strenuous, walking, traveling, going up in elevation in the airplane, tolerating weather changes, lifting, reading, driving, sleeping, recreation, going to the gym, housework, leisure activities. She was referred to PT by Dr. Franky Ambrosia, DO, with request for evaluation and treatment with MDT approach by the present PT who is Cert. MDT.   PERTINENT HISTORY:  Hand dominance: Right  Patient Information Work Demands: On disability since a couple of years ago Leisure Activities: spend time with kids, dogs, family, was working out at a gym until this happened.  Functional Limitation for Present Episode: difficulty with usual activities including anything anything strenuous, walking, traveling, going up in elevation in the airplane, tolerating weather changes, lifting, reading, driving, sleeping, recreation, going to the  gym, housework, leisure activities. Outcome / Screening Score: Neck Disability Index (NDI): 40% (range 0-100%) PAIN: Are you having pain? Yes NPRS: Current: 5/10,  Best: 3-4/10, Worst: 9-10/10. Pain location: see below Pain description: sharp pain down the arm, stinging sensation, used to have paresthesia in the left arm for the first month Aggravating factors: see below Relieving factors: see below  Present Symptoms: top of cervical spine (maybe a bit into her head - also gets real bad migraine headaches - more often than they were) to lower thoracic spine Present Since: 03/2023, got worse at first now the same.  Commenced As a Result of: a little over a year ago she thought maybe she slept wrong or something. She woke up with a crick in her neck and it never went away. She thought it was related to her psoriatic arthritis. Her rheumatologist set her up for an MRI and sent her to Dr. Ambrosia at [facility] spine center and sent her to PT. He said depending on how PT goes he may refer her to a surgeon at the spine center. She uses a TENS unit Symptoms at Onset: neck and numbness and tingling to left forearm. she thinks it started at the base of her neck and then it spread down her back to her lower ribs left upper trap and left shoulder to her mid upper arm. She states it was numbness and tingling down to her forearm at first (better now).   Constant Symptoms: neck and back Intermittent Symptoms: arm  Worse - Bending: Always - Sitting: Always - Turning: Always - Lying: Sometimes certain positions feel better (feels like a rotisserie chicken always needs to move) - Rising: Always - AM: not better or worse - As the Day Progresses: Sometimes worse throughout the day if she is up cooking and cleaning - PM: Sometimes - When Still: Sometimes - On the Move: Always - Other: anything strenuous, walking, traveling, going up in elevation in the airplane, weather  Better - Bending: Never - Sitting:  Sometimes  - Turning: Never - Lying: Sometimes - Rising: Never - AM: Never - As the Day Progresses: Never - PM: Never - When Still: Sometimes - On the Move: Never - Other: TENS unit, hot shower, heating pad, pain medication, tylenol :   Sleep and Rest  Disturbed Sleep: Yes: hard to fall asleep, wakes her up, sleeps in the recliner some, sometimes can go back to sleep Sleeping Postures: side R Pillows: 2  pillows one under head and one between legs  Medical History  Previous Spinal History: psoriatic arthritis, rheumatoid arthritis, denies surgeries or injections.  Previous Treatments: TENS unit, pain medication.    SPECIFIC QUESTIONS - Dizziness: Yes lost a lot of weight since the beginning of the year and blood pressure was running high or low - now fixed, Tinnitus: No, Nausea: Yes with issue going on in stomach - colonoscopy Friday, Vision: No , Speech: No. - Upper Limbs: Abnormal- harder to reach with left arm or use left arm because it hurts and pulls, has not noticed weakness, no longer has numbness and tingling there ( was only present intermittently for a month).  - Gait: Normal  Medications: see chart General Health/Comorbidities:  she has Psoriatic arthritis (HCC); Essential hypertension, benign; Morbid obesity (HCC); Family history of ovarian cancer; PTSD (post-traumatic stress disorder); Renal stones; Migraines; Chest pain; Hyperlipidemia; Acute respiratory failure with hypoxia (HCC); Acute bronchitis; Sepsis (HCC); Attention deficit hyperactivity disorder (ADHD), predominantly inattentive type; Mild episode of recurrent major depressive disorder [facility]); NSTEMI (non-ST elevated myocardial infarction) (HCC); Seizure (HCC); Coronary artery disease involving native coronary artery of native heart without angina pectoris; 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 she  has a past medical  history of Anxiety, Arthritis, Depression, GERD (gastroesophageal reflux disease), Hypertension, Kidney stones, MI (myocardial infarction) (HCC), Migraine headache, Obesity, Psoriatic arthritis (HCC), and Rheumatoid arthritis (HCC).   Patient denies hx of cancer, stroke, lung problems, diabetes, unexplained weight loss, unexplained stumbling or dropping things, osteoporosis, and spinal surgery. In last 2 weeks she has had bowel problems and the doctors said they thought she had an infection in her bowel. Colonoscopy is this Friday.   Seizures: She took keppra  for a while. She has seizures occasionally. Then she had little seizures after that. She has not had one in over 6 months. She is currently driving and not on keppra . Never found out where seizures are from. When she has a seizure she jerks and her arms jerk and cannot put words together. She has lost consciousness. She has no known triggers.   She had a heart attack in 2021 and had pain in her left arm but it resolved.   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   FINDINGS:  Bone marrow signal intensity is normal.   The vertebral bodies are normally aligned. Straightening of the normal cervical lordosis. Multilevel intervertebral disc height narrowing and degeneration, most pronounced at C5-6.   C2-C3: No spinal canal narrowing. No neural foraminal narrowing.   C3-C4: Mild disc bulging and right greater than left uncovertebral hypertrophy resulting in mild right and no  significant left neural foraminal narrowing. No significant spinal canal narrowing.   C4-C5: Diffuse disc bulge combined with right greater than left uncovertebral hypertrophy resulting in mild to moderate right and no significant left neural foraminal narrowing. No significant spinal canal narrowing.   C5-C6: Disc osteophyte complex with superimposed right paracentral/foraminal zone disc extrusion and uncovertebral hypertrophy effacing the subarticular zone and abutting/flattening the ventral spinal cord. Moderate spinal  canal narrowing. Severe right and mild to moderate left neural foraminal narrowing. Questionable T2 hyperintense signal within the right lateral hemicord at C5-6 (102:52, 4:9, and 6:29).   C6-C7: Diffuse disc bulge combined with uncovertebral and facet arthrosis resulting in severe left and mild to moderate right neural foraminal narrowing. Mild spinal canal narrowing.   C7-T1: No significant spinal canal or neural foraminal narrowing.   The paraspinal tissues are within normal limits.   IMPRESSION:  Multilevel cervical spondylosis most pronounced at C5-6 where there is a right paracentral/foraminal zone disc extrusion with uncovertebral hypertrophy compressing/abutting the ventral spinal cord and contributing to severe right neural foraminal and lateral recess narrowing with moderate spinal canal narrowing. 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   Varying degrees of foraminal narrowing as described. No more than mild spinal canal narrowing of the remaining levels.   PRECAUTIONS: None  WEIGHT BEARING RESTRICTIONS: No  FALLS:  Has patient fallen in last 6 months? No  PATIENT GOALS: Not to have surgery  NEXT MD VISIT: Towards the end of September       OBJECTIVE   Sitting Posture: slump, forward head with increased kyphosis at CT junction, protracted scapulae Protruded head: Yes Lateral deviation: nil Change of Posture  Effect: worse Lateral deviation relevant:No   Neurological MYOTOMES/MMT:  *Indicates pain 03/15/24 Date Date  Joint/Motion R/L R/L R/L  Shoulder     Flexion / / /  Abduction (C5) / / /  External rotation (C5) 5/limited by pain / /  Internal rotation / / /  Extension / / /  Periscapular     Upper Trap / / /  Middle Trap / / /  Lower Trap / / /  Elbow     Flexion (C6) 4/limted by pain / /  Extension (C7) 5/limited by pain / /  Hand     Digiti Minimi (C8) 4/4- / /  Finger adduction (T1) 5/5 / /  MCP ext (C7) 5/4 / /   Reflexes:  Biceps: R 2+, L 1+ Triceps: R 2+, L 1+ Wrist extensors: R 0, L1+ Pronator: B 0 Finger flexors: R 1+, L 2+ Hoffmans: L negaitve Sensory Deficit: NT Neurodynamic Tests: NT  Movement Loss Movement Loss Symptoms  Protrusion nil Pulling   Flexion nil pulling  Retraction mod pulling  Extension mod pulling  Lateral flexion R nil   Lateral flexion L nil   Rotation R min Increased left sided pain  Rotation L  min Feels better than rotation right    Repeated Movement Testing Pretest symptoms (in sitting): L shoulder, neck, and back 5/10 Test Movement Symptom During Symptom After Mechanical Response Key Functional Test  Retraction in sitting increases no better    Rep Retraction in Sitting, 1x10 produces but also feels better better  better  Rep retraction in sitting with self OP, 1x10 produces but also feels better better  better                             TREATMENT  Therapeutic exercise: therapeutic exercises that incorporate ONE parameter at one or more areas of the body to centralize symptoms, develop strength and endurance, range of motion, and flexibility required for successful completion of functional activities. Repeated retraction with self overpressure 1x10  Education and trial of seated posture with lumbar  roll  Education on diagnosis, prognosis, POC, anatomy and physiology of current condition.   Education on HEP including handout   Pt required multimodal cuing for proper technique and to facilitate improved neuromuscular control, strength, range of motion, and functional ability resulting in improved performance and form.  PATIENT EDUCATION:  Education details: Exercise purpose/form. Self management techniques. Education on diagnosis, prognosis, POC, anatomy and physiology of current condition. Education on HEP including handout. Person educated: Patient Education method: Explanation, Demonstration, and Handouts Education comprehension: verbalized understanding, returned demonstration, and needs further education  HOME EXERCISE PROGRAM: Access Code: [email or web address] URL: [email or web address] Date: 03/15/2024 Prepared by: Camie Cleverly  Exercises - Seated Posture with Lumbar Roll  - Cervical Retraction with Overpressure  - 1 sets - 15 reps - 2 second hold - every 2-3 hours while awake frequency  ASSESSMENT:  CLINICAL IMPRESSION: Patient is a 43 y.o. female referred to outpatient physical therapy with a medical diagnosis of radiculitis of right cervical region with request for evaluation and treatment using MDT approach who presents with signs and symptoms consistent with neck pain with left sided radiculopathy and MDT classification of cervical derangement syndrome with extension preference. Patient presents with significant pain, ROM, joint stiffness, posture, muscle performance (strength/power/endurance), motor control, knowledge, and activity tolerance impairments that are limiting ability to complete usual activities including anything anything strenuous, walking, traveling, going up in elevation in the airplane, tolerating weather changes, lifting, reading, driving, sleeping, recreation, going to the gym, housework, leisure activities without difficulty. Patient will benefit from  skilled physical therapy intervention to address current body structure impairments and activity limitations to improve function and work towards goals set in current POC in order to return to prior level of function or maximal functional improvement.   MDT Provisional Classification: Derangement MDT Directional Preference: extension   OBJECTIVE IMPAIRMENTS: decreased activity tolerance, decreased coordination, decreased endurance, decreased knowledge of condition, decreased ROM, decreased strength, hypomobility, increased muscle spasms, impaired UE functional use, improper body mechanics, postural dysfunction, and pain.   ACTIVITY LIMITATIONS: carrying, lifting, bending, sitting, standing, sleeping, bed mobility, dressing, reach over head, hygiene/grooming, and caring for others  PARTICIPATION LIMITATIONS: meal prep, cleaning, laundry, interpersonal relationship, driving, shopping, community activity, yard work, and going to the gym  PERSONAL FACTORS: Fitness, Past/current experiences, Time since onset of injury/illness/exacerbation, and 3+ comorbidities:  Psoriatic arthritis (HCC); Essential hypertension, benign; Morbid obesity (HCC); Family history of ovarian cancer; PTSD (post-traumatic stress disorder); Renal stones; Migraines; Chest pain; Hyperlipidemia; Acute respiratory failure with hypoxia (HCC); Acute bronchitis; Sepsis (HCC); Attention deficit hyperactivity disorder (ADHD), predominantly inattentive type; Mild episode of recurrent major depressive disorder [facility]); NSTEMI (non-ST elevated myocardial infarction) (HCC); Seizure (HCC); Coronary artery disease involving native coronary artery of native heart without angina pectoris; 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 she  has a past medical history of Anxiety, Arthritis, Depression, GERD (gastroesophageal reflux disease), Hypertension, Kidney  stones, MI (myocardial infarction) (HCC), Migraine headache, Obesity, Psoriatic arthritis (HCC), and Rheumatoid arthritis are also affecting patient's functional outcome.   REHAB POTENTIAL: Good  CLINICAL DECISION MAKING: Evolving/moderate complexity  EVALUATION COMPLEXITY: Moderate   GOALS: Goals reviewed with patient? No  SHORT  TERM GOALS: Target date: 03/29/2024  Patient will be independent with initial home exercise program for self-management of symptoms. Baseline: Initial HEP provided at IE (03/15/24); Goal status: INITIAL   LONG TERM GOALS: Target date: 06/07/2024  Patient will be independent with a long-term home exercise program for self-management of symptoms.  Baseline: Initial HEP provided at IE (03/15/24); Goal status: INITIAL  2.  Patient will demonstrate improved Neck Disability Index (NDI) to equal or less than 10% to demonstrate improvement in overall condition and self-reported functional ability.  Baseline: 40% (03/15/24); Goal status: INITIAL  3.  Patient will demonstrate B UE strength equal to improve ability to perform usual functional tasks such as traveling, going to the gym, housework, and strenuous activities.  Baseline: L UE strength testing limited by pain (03/15/24); Goal status: INITIAL  4.  Patient will demonstrate full cervical spine AROM with no increase in pain beyond mild intermittent end range discomfort to improve her abiltiy to perform daily tasks such as driving, sleeping, housework, and viewing surroundings with less difficulty.  Baseline: limited and painful in several directions (03/15/24); Goal status: INITIAL  5.  Patient will demonstrate improvement in Patient Specific Functional Scale (PSFS) of equal or greater than 8/10 points to reflect clinically significant improvement in patient's most valued functional activities. Baseline: to be tested at visit 2 as appropriate (03/15/24); Goal status: INITIAL  6.  Patient will report NPRS  equal or less than 3/10 during functional activities during the last 2 weeks to improve their abilitly to complete community, work and/or recreational activities with less limitation. Baseline: 10/10 (03/15/24); Goal status: INITIAL    PLAN:  PT FREQUENCY: 2x/week  PT DURATION: 8-12 weeks  PLANNED INTERVENTIONS: 97164- PT Re-evaluation, 97750- Physical Performance Testing, 97110-Therapeutic exercises, 97530- Therapeutic activity, [id number]- Neuromuscular re-education, 97535- Self Care, 02859- Manual therapy, [id number]- Electrical stimulation (unattended), 02987- Traction (mechanical), 20560 (1-2 muscles), 20561 (3+ muscles)- Dry Needling, Patient/Family education, Joint mobilization, Spinal mobilization, Cryotherapy, and Moist heat  PLAN FOR NEXT SESSION: update HEP as appropriate. Re-assess mechanical response and progress as needed. Check for nerve tension and educate accordingly. Assess for load, position, and neural tension sensitivity as appropriate.   PRINCIPLES OF MANAGEMENT Education: improved posture sitting with lumbar roll, decrease flexion and forward head positions Exercise Type: cervical retraction with self overpressure  Frequency: 1x15 reps every 2-3 hours while awake  Other Exercises / Interventions: postural education Management Goals: progress through the following steps reduce derangement, maintain reduction, recover function, prophylaxis  Camie SAUNDERS. Juli, PT, DPT, Cert. MDT 03/15/24, 3:19 PM  [facility] [address] [place], [place] [address] P: [phone] I F: [phone]

## 2024-03-15 ENCOUNTER — Ambulatory Visit: Attending: Physical Medicine & Rehabilitation | Admitting: Physical Therapy

## 2024-03-15 ENCOUNTER — Encounter: Payer: Self-pay | Admitting: Physical Therapy

## 2024-03-15 DIAGNOSIS — M792 Neuralgia and neuritis, unspecified: Secondary | ICD-10-CM | POA: Insufficient documentation

## 2024-03-15 DIAGNOSIS — M542 Cervicalgia: Secondary | ICD-10-CM | POA: Insufficient documentation

## 2024-04-11 ENCOUNTER — Ambulatory Visit: Admitting: Physical Therapy

## 2024-04-18 ENCOUNTER — Ambulatory Visit: Admitting: Physical Therapy

## 2024-04-20 ENCOUNTER — Ambulatory Visit: Admitting: Physical Therapy

## 2024-04-25 ENCOUNTER — Encounter: Admitting: Physical Therapy

## 2024-04-27 ENCOUNTER — Encounter: Admitting: Physical Therapy

## 2024-05-02 ENCOUNTER — Encounter: Admitting: Physical Therapy

## 2024-05-04 ENCOUNTER — Telehealth: Payer: Self-pay | Admitting: Physical Therapy

## 2024-05-04 ENCOUNTER — Ambulatory Visit: Attending: Physical Medicine & Rehabilitation | Admitting: Physical Therapy

## 2024-05-04 NOTE — Telephone Encounter (Signed)
 LVM requesting patient call back as soon as possible after she did not show up for her PT appointment scheduled at 1:45pm today.   Support staff also left VM yesterday asking pt to call, but have not heard from patient.   She needs a new PT order or written release from a physician/extender to return to PT due to being hospitalized since last PT visit.    Camie SAUNDERS. Juli, PT, DPT 05/04/24, 2:24 PM  [facility] [address] [place], [place] [address] P: [phone] I F: [phone]

## 2024-05-09 ENCOUNTER — Ambulatory Visit: Admitting: Physical Therapy

## 2024-05-11 ENCOUNTER — Encounter: Admitting: Physical Therapy

## 2024-05-17 ENCOUNTER — Ambulatory Visit: Admitting: Physical Therapy

## 2024-05-23 ENCOUNTER — Ambulatory Visit: Admitting: Physical Therapy
